# Patient Record
Sex: Male | Born: 1960 | Race: Black or African American | Hispanic: No | Marital: Married | Smoking: Former smoker
Health system: Southern US, Community
[De-identification: ages and names within clinical notes are randomized; demographics above are authoritative.]

## PROBLEM LIST (undated history)

## (undated) DIAGNOSIS — F4310 Post-traumatic stress disorder, unspecified: Secondary | ICD-10-CM

## (undated) DIAGNOSIS — E7800 Pure hypercholesterolemia, unspecified: Secondary | ICD-10-CM

## (undated) DIAGNOSIS — C61 Malignant neoplasm of prostate: Secondary | ICD-10-CM

## (undated) DIAGNOSIS — I10 Essential (primary) hypertension: Secondary | ICD-10-CM

## (undated) DIAGNOSIS — I509 Heart failure, unspecified: Secondary | ICD-10-CM

## (undated) DIAGNOSIS — I515 Myocardial degeneration: Secondary | ICD-10-CM

## (undated) DIAGNOSIS — I2510 Atherosclerotic heart disease of native coronary artery without angina pectoris: Secondary | ICD-10-CM

## (undated) HISTORY — PX: EXPLORATORY LAPAROTOMY W/ BOWEL RESECTION: SHX1544

## (undated) HISTORY — PX: LEFT HEART CATH AND CORONARY ANGIOGRAPHY: CATH118249

---

## 2008-06-20 ENCOUNTER — Encounter (INDEPENDENT_AMBULATORY_CARE_PROVIDER_SITE_OTHER): Payer: Self-pay | Admitting: Cardiovascular Disease

## 2008-06-20 ENCOUNTER — Inpatient Hospital Stay (HOSPITAL_COMMUNITY): Admission: EM | Admit: 2008-06-20 | Discharge: 2008-06-21 | Payer: Self-pay | Admitting: Emergency Medicine

## 2009-06-07 ENCOUNTER — Emergency Department (HOSPITAL_COMMUNITY): Admission: EM | Admit: 2009-06-07 | Discharge: 2009-06-07 | Payer: Self-pay | Admitting: Emergency Medicine

## 2009-06-17 ENCOUNTER — Emergency Department (HOSPITAL_COMMUNITY): Admission: EM | Admit: 2009-06-17 | Discharge: 2009-06-17 | Payer: Self-pay | Admitting: Emergency Medicine

## 2010-07-07 ENCOUNTER — Emergency Department (HOSPITAL_COMMUNITY)
Admission: EM | Admit: 2010-07-07 | Discharge: 2010-07-07 | Disposition: A | Discharge status: Home / Self Care | Payer: Self-pay | Attending: Emergency Medicine | Admitting: Emergency Medicine

## 2010-07-07 ENCOUNTER — Emergency Department (HOSPITAL_COMMUNITY): Payer: Self-pay

## 2010-07-07 DIAGNOSIS — R0789 Other chest pain: Secondary | ICD-10-CM | POA: Insufficient documentation

## 2010-07-07 DIAGNOSIS — I10 Essential (primary) hypertension: Secondary | ICD-10-CM | POA: Insufficient documentation

## 2010-07-07 DIAGNOSIS — J40 Bronchitis, not specified as acute or chronic: Secondary | ICD-10-CM | POA: Insufficient documentation

## 2010-07-07 DIAGNOSIS — R6883 Chills (without fever): Secondary | ICD-10-CM | POA: Insufficient documentation

## 2010-07-07 DIAGNOSIS — R1013 Epigastric pain: Secondary | ICD-10-CM | POA: Insufficient documentation

## 2010-07-07 DIAGNOSIS — R059 Cough, unspecified: Secondary | ICD-10-CM | POA: Insufficient documentation

## 2010-07-07 DIAGNOSIS — Z79899 Other long term (current) drug therapy: Secondary | ICD-10-CM | POA: Insufficient documentation

## 2010-07-07 DIAGNOSIS — R110 Nausea: Secondary | ICD-10-CM | POA: Insufficient documentation

## 2010-07-07 DIAGNOSIS — R05 Cough: Secondary | ICD-10-CM | POA: Insufficient documentation

## 2010-07-07 DIAGNOSIS — K219 Gastro-esophageal reflux disease without esophagitis: Secondary | ICD-10-CM | POA: Insufficient documentation

## 2010-07-07 LAB — CBC
Hemoglobin: 15.8 g/dL (ref 13.0–17.0)
MCH: 28.4 pg (ref 26.0–34.0)
RDW: 13.2 % (ref 11.5–15.5)

## 2010-07-07 LAB — COMPREHENSIVE METABOLIC PANEL
Alkaline Phosphatase: 41 U/L (ref 39–117)
BUN: 22 mg/dL (ref 6–23)
CO2: 23 mEq/L (ref 19–32)
GFR calc Af Amer: 58 mL/min — ABNORMAL LOW (ref >60)
GFR calc non Af Amer: 48 mL/min — ABNORMAL LOW (ref >60)
Glucose, Bld: 88 mg/dL (ref 70–99)
Total Protein: 8.3 g/dL (ref 6.0–8.3)

## 2010-07-07 LAB — DIFFERENTIAL
Basophils Relative: 0 % (ref 0–1)
Eosinophils Absolute: 0 10*3/uL (ref 0.0–0.7)
Lymphs Abs: 1.5 10*3/uL (ref 0.7–4.0)
Neutrophils Relative %: 83 % — ABNORMAL HIGH (ref 43–77)

## 2010-07-07 LAB — CK TOTAL AND CKMB (NOT AT ARMC)
Relative Index: 1.2 (ref 0.0–2.5)
Total CK: 332 U/L — ABNORMAL HIGH (ref 7–232)

## 2010-07-07 LAB — POCT CARDIAC MARKERS: CKMB, poc: 3.7 ng/mL (ref 1.0–8.0)

## 2010-07-07 LAB — PROTIME-INR: INR: 0.89 (ref 0.00–1.49)

## 2010-07-26 LAB — DIFFERENTIAL
Basophils Absolute: 0 10*3/uL (ref 0.0–0.1)
Basophils Relative: 0 % (ref 0–1)
Eosinophils Absolute: 0 10*3/uL (ref 0.0–0.7)
Monocytes Absolute: 0.8 10*3/uL (ref 0.1–1.0)
Monocytes Relative: 8 % (ref 3–12)
Neutro Abs: 6.9 10*3/uL (ref 1.7–7.7)
Neutrophils Relative %: 67 % (ref 43–77)

## 2010-07-26 LAB — HEPATIC FUNCTION PANEL
ALT: 15 U/L (ref 0–53)
AST: 23 U/L (ref 0–37)
Albumin: 3.8 g/dL (ref 3.5–5.2)
Alkaline Phosphatase: 31 U/L — ABNORMAL LOW (ref 39–117)
Total Bilirubin: 0.7 mg/dL (ref 0.3–1.2)

## 2010-07-26 LAB — POCT CARDIAC MARKERS
CKMB, poc: 1.8 ng/mL (ref 1.0–8.0)
CKMB, poc: 2.2 ng/mL (ref 1.0–8.0)
Myoglobin, poc: 284 ng/mL (ref 12–200)
Troponin i, poc: <0.05 ng/mL (ref 0.00–0.09)

## 2010-07-26 LAB — CBC
MCHC: 32.3 g/dL (ref 30.0–36.0)
Platelets: 222 10*3/uL (ref 150–400)
RDW: 13.7 % (ref 11.5–15.5)

## 2010-07-26 LAB — BASIC METABOLIC PANEL
CO2: 24 mEq/L (ref 19–32)
Calcium: 9 mg/dL (ref 8.4–10.5)
Chloride: 106 mEq/L (ref 96–112)
Potassium: 3.7 mEq/L (ref 3.5–5.1)
Sodium: 139 mEq/L (ref 135–145)

## 2010-07-26 LAB — D-DIMER, QUANTITATIVE: D-Dimer, Quant: <0.22 ug/mL-FEU (ref 0.00–0.48)

## 2010-07-29 LAB — POCT I-STAT, CHEM 8
Chloride: 108 mEq/L (ref 96–112)
Creatinine, Ser: 1.1 mg/dL (ref 0.4–1.5)
Glucose, Bld: 90 mg/dL (ref 70–99)
HCT: 42 % (ref 39.0–52.0)

## 2010-07-29 LAB — POCT CARDIAC MARKERS
CKMB, poc: 3.8 ng/mL (ref 1.0–8.0)
Myoglobin, poc: 246 ng/mL (ref 12–200)
Troponin i, poc: <0.05 ng/mL (ref 0.00–0.09)

## 2010-08-25 LAB — COMPREHENSIVE METABOLIC PANEL
ALT: 32 U/L (ref 0–53)
AST: 39 U/L — ABNORMAL HIGH (ref 0–37)
Calcium: 9.1 mg/dL (ref 8.4–10.5)
GFR calc Af Amer: >60 mL/min (ref >60)
Sodium: 138 mEq/L (ref 135–145)
Total Protein: 6.5 g/dL (ref 6.0–8.3)

## 2010-08-25 LAB — POCT I-STAT 3, VENOUS BLOOD GAS (G3P V)
Acid-base deficit: 6 mmol/L — ABNORMAL HIGH (ref 0.0–2.0)
Bicarbonate: 19.7 mEq/L — ABNORMAL LOW (ref 20.0–24.0)
pH, Ven: 7.291 (ref 7.250–7.300)
pO2, Ven: 39 mmHg (ref 30.0–45.0)

## 2010-08-25 LAB — CARDIAC PANEL(CRET KIN+CKTOT+MB+TROPI)
CK, MB: 3.4 ng/mL (ref 0.3–4.0)
Relative Index: 1 (ref 0.0–2.5)
Total CK: 253 U/L — ABNORMAL HIGH (ref 7–232)
Total CK: 294 U/L — ABNORMAL HIGH (ref 7–232)
Troponin I: 0.01 ng/mL (ref 0.00–0.06)
Troponin I: <0.01 ng/mL (ref 0.00–0.06)
Troponin I: <0.01 ng/mL (ref 0.00–0.06)

## 2010-08-25 LAB — BASIC METABOLIC PANEL
BUN: 10 mg/dL (ref 6–23)
CO2: 23 mEq/L (ref 19–32)
Calcium: 8.6 mg/dL (ref 8.4–10.5)
Calcium: 8.8 mg/dL (ref 8.4–10.5)
Creatinine, Ser: 0.98 mg/dL (ref 0.4–1.5)
Creatinine, Ser: 0.98 mg/dL (ref 0.4–1.5)
GFR calc Af Amer: >60 mL/min (ref >60)
GFR calc non Af Amer: >60 mL/min (ref >60)
GFR calc non Af Amer: >60 mL/min (ref >60)
Glucose, Bld: 143 mg/dL — ABNORMAL HIGH (ref 70–99)
Potassium: 4.2 mEq/L (ref 3.5–5.1)
Sodium: 138 mEq/L (ref 135–145)

## 2010-08-25 LAB — CBC
HCT: 39.6 % (ref 39.0–52.0)
Hemoglobin: 13.6 g/dL (ref 13.0–17.0)
Hemoglobin: 13.8 g/dL (ref 13.0–17.0)
MCHC: 34.3 g/dL (ref 30.0–36.0)
MCHC: 34.6 g/dL (ref 30.0–36.0)
MCV: 90.1 fL (ref 78.0–100.0)
Platelets: 227 10*3/uL (ref 150–400)
RBC: 4.56 MIL/uL (ref 4.22–5.81)
RDW: 14 % (ref 11.5–15.5)
RDW: 14.2 % (ref 11.5–15.5)
WBC: 5.9 10*3/uL (ref 4.0–10.5)

## 2010-08-25 LAB — POCT I-STAT, CHEM 8
Creatinine, Ser: 1 mg/dL (ref 0.4–1.5)
Glucose, Bld: 145 mg/dL — ABNORMAL HIGH (ref 70–99)
HCT: 45 % (ref 39.0–52.0)
Hemoglobin: 15.3 g/dL (ref 13.0–17.0)
Potassium: 3.4 mEq/L — ABNORMAL LOW (ref 3.5–5.1)
Sodium: 141 mEq/L (ref 135–145)
TCO2: 24 mmol/L (ref 0–100)

## 2010-08-25 LAB — TROPONIN I: Troponin I: <0.01 ng/mL (ref 0.00–0.06)

## 2010-08-25 LAB — POCT I-STAT 3, ART BLOOD GAS (G3+)
Acid-base deficit: 2 mmol/L (ref 0.0–2.0)
pH, Arterial: 7.379 (ref 7.350–7.450)

## 2010-08-25 LAB — DIFFERENTIAL
Eosinophils Absolute: 0.1 10*3/uL (ref 0.0–0.7)
Eosinophils Relative: 1 % (ref 0–5)
Lymphs Abs: 1.3 10*3/uL (ref 0.7–4.0)
Monocytes Relative: 8 % (ref 3–12)

## 2010-08-25 LAB — URINALYSIS, ROUTINE W REFLEX MICROSCOPIC
Glucose, UA: NEGATIVE mg/dL
Hgb urine dipstick: NEGATIVE
Protein, ur: NEGATIVE mg/dL
pH: 6 (ref 5.0–8.0)

## 2010-08-25 LAB — SEDIMENTATION RATE: Sed Rate: 2 mm/hr (ref 0–16)

## 2010-08-25 LAB — POCT CARDIAC MARKERS
CKMB, poc: 2.1 ng/mL (ref 1.0–8.0)
Myoglobin, poc: 242 ng/mL (ref 12–200)

## 2010-08-25 LAB — CK TOTAL AND CKMB (NOT AT ARMC)
CK, MB: 3.8 ng/mL (ref 0.3–4.0)
Total CK: 373 U/L — ABNORMAL HIGH (ref 7–232)

## 2010-08-25 LAB — URINE MICROSCOPIC-ADD ON

## 2010-08-25 LAB — LIPID PANEL: VLDL: 21 mg/dL (ref 0–40)

## 2010-08-25 LAB — PROTIME-INR: Prothrombin Time: 13.1 seconds (ref 11.6–15.2)

## 2010-08-25 LAB — MAGNESIUM: Magnesium: 1.7 mg/dL (ref 1.5–2.5)

## 2010-08-25 LAB — APTT: aPTT: 27 seconds (ref 24–37)

## 2010-09-22 NOTE — Cardiovascular Report (Signed)
NAME:  Glenn Brandt, Glenn Brandt NO.:  [id number]   MEDICAL RECORD NO.:  [id number]           PATIENT TYPE:   LOCATION:                                 FACILITY:   PHYSICIAN:  Madaline Savage, M.D.DATE OF BIRTH:  03-25-1961   DATE OF PROCEDURE:  06/20/2008  DATE OF DISCHARGE:                            CARDIAC CATHETERIZATION   PROCEDURES PERFORMED:  1. Selective coronary angiography by Judkins technique.  2. Retrograde left heart catheterization.  3. Left ventricular angiography.  4. Right heart catheterization with Fick cardiac output      determinations.   INTERVENTIONS:  None.   COMPLICATIONS:  None.   PATIENT PROFILE:  [name] is a 50 year old African American gentleman  who was admitted on June 19, 2001, for chest pain, left neck pain,  and shoulder pain.  He has a [facility].  He does not give a history of a prior cardiac  history but presented to our emergency room with chest pain and had an  EKG showing increased voltages without actual left ventricular  hypertrophy, sinus bradycardia at a rate of 57 a minute, and some T-wave  inversions in the inferior leads.  A combined right and left heart  catheterization was performed electively from the right percutaneous  femoral approach using a 5-French arterial catheter and a 7-French  venous catheter with a Swan-Ganz catheter for him with dynamic  measurements.  No complications occurred.  This procedure was performed  via right percutaneous femoral approach.   RESULTS:  Pressures:  The central aortic pressure was 130/70 with a mean  of 97.  The left ventricular pressure was 115/1, end-diastolic pressure  of 8.  Right atrial pressure was 9/7 with a mean of 4.  Right  ventricular pressure was 26/1 with an end-diastolic pressure of 6.  Pulmonary artery pressure was 26/10 with a mean pressure of 16.  Pulmonary capillary wedge mean pressure was 8.  Pulmonary  artery  saturation was 68.  Central aortic saturation was 95.   ANGIOGRAPHIC RESULTS:  The patient had normal coronary arteries with no  coronary calcification.  No spasm and no evidence of coronary disease.   Left ventricular angiogram showed mildly dilated left ventricle with  ejection fraction estimated between 30 and 40% on a global basis.   The Fick cardiac index was 2.67.  The Fick cardiac output was 5.33.   FINAL IMPRESSIONS:  1. Recent chest pain of a somewhat atypical nature.  2. History of alcohol use.  3. Dilated cardiomyopathy with left ventricular ejection fraction of      30-40%.  4. Angiographically patent coronary arteries.   PLAN:  The patient will get counseling for alcohol use.  He will need a  regimen of digitalis, ACE inhibitor, beta-blocker, and BiDil.  He will  be followed up with an echocardiogram now and again in 3 months after  medical therapy has been tried for 3 months.           ______________________________  Madaline Savage, M.D.     WHG/MEDQ  D:  06/20/2008  T:  06/20/2008  Job:  [id number]   cc:   Nanetta Batty, M.D.  [facility]

## 2010-09-22 NOTE — Discharge Summary (Signed)
NAME:  Glenn Brandt, Glenn Brandt NO.:  [id number]   MEDICAL RECORD NO.:  [id number]          PATIENT TYPE:  INP   LOCATION:  4706                         FACILITY:  [facility]   PHYSICIAN:  Ritta Slot, MD     DATE OF BIRTH:  [birth year]   DATE OF ADMISSION:  06/19/2008  DATE OF DISCHARGE:  06/21/2008                               DISCHARGE SUMMARY   DISCHARGE DIAGNOSES:  1. Chest pain, worrisome for unstable angina, catheterization this      admission showing normal coronaries.  2. Abnormal EKG with left ventricular hypertrophy and diffuse T-wave      inversion.  3. Nonischemic cardiomyopathy with ejection fraction of 35% at cath,      ejection fraction by echo was pending.  4. History of smoking.  5. History of alcohol abuse.   HOSPITAL COURSE:  The patient is a 50 year old male with no prior  cardiac history.  He does have a family history of premature coronary  artery disease.  He presented to the emergency room on June 19, 2008, with chest pain, worrisome for unstable angina.  He does have  positive risk factors and we decided to proceed with a diagnostic  catheterization.  This was done on June 20, 2008.  The patient was  put on Lovenox prior to the procedure.  Catheterization revealed normal  coronaries.  His EF was 35%.  Echocardiogram has been obtained and  results of this are pending.  This can be followed up as an outpatient.  He was seen in consult by smoking cessation.   DISCHARGE MEDICATIONS:  1. Altace 5 mg a day.  2. Coreg 3.125 mg b.i.d.  3. Coated aspirin 81 mg a day.  4. Naprosyn 500 mg b.i.d. p.r.n.   LABORATORY DATA:  White count 11, hemoglobin 13.8, hematocrit 39.7, and  platelets 228.  Sodium 138, potassium 3.8, BUN 8, and creatinine 0.98.  SGOT is 39 and SGPT is 32.  CK was elevated at 373.  Troponins were  negative.  Hemoglobin A1c is 5.5.  TSH is 0.96.  C-reactive protein is  0.1.  Chest x-ray shows no acute findings.   DISPOSITION:   The patient is discharged in stable condition.  We had to  limit his medications because of relative hypotension.  He will need a  followup echocardiogram in a couple of months.  He has been instructed  to avoid alcohol and on a low-salt diet.      Abelino Derrick, P.A.      Ritta Slot, MD  Electronically Signed   LKK/MEDQ  D:  06/21/2008  T:  06/21/2008  Job:  [id number]
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
## 2012-04-04 ENCOUNTER — Emergency Department (HOSPITAL_COMMUNITY): Payer: Non-veteran care

## 2012-04-04 ENCOUNTER — Observation Stay (HOSPITAL_COMMUNITY)
Admission: EM | Admit: 2012-04-04 | Discharge: 2012-04-05 | Disposition: A | Discharge status: Home / Self Care | Payer: Non-veteran care | Attending: Emergency Medicine | Admitting: Emergency Medicine

## 2012-04-04 ENCOUNTER — Encounter (HOSPITAL_COMMUNITY): Payer: Self-pay | Admitting: *Deleted

## 2012-04-04 DIAGNOSIS — I2510 Atherosclerotic heart disease of native coronary artery without angina pectoris: Secondary | ICD-10-CM | POA: Insufficient documentation

## 2012-04-04 DIAGNOSIS — Z8679 Personal history of other diseases of the circulatory system: Secondary | ICD-10-CM

## 2012-04-04 DIAGNOSIS — R079 Chest pain, unspecified: Principal | ICD-10-CM | POA: Insufficient documentation

## 2012-04-04 DIAGNOSIS — F17200 Nicotine dependence, unspecified, uncomplicated: Secondary | ICD-10-CM | POA: Insufficient documentation

## 2012-04-04 DIAGNOSIS — I252 Old myocardial infarction: Secondary | ICD-10-CM | POA: Insufficient documentation

## 2012-04-04 DIAGNOSIS — I10 Essential (primary) hypertension: Secondary | ICD-10-CM | POA: Insufficient documentation

## 2012-04-04 HISTORY — DX: Myocardial degeneration: I51.5

## 2012-04-04 HISTORY — DX: Atherosclerotic heart disease of native coronary artery without angina pectoris: I25.10

## 2012-04-04 LAB — CBC WITH DIFFERENTIAL/PLATELET
Basophils Absolute: 0 10*3/uL (ref 0.0–0.1)
Basophils Relative: 0 % (ref 0–1)
Eosinophils Relative: 2 % (ref 0–5)
Lymphocytes Relative: 30 % (ref 12–46)
MCHC: 34.4 g/dL (ref 30.0–36.0)
MCV: 82.1 fL (ref 78.0–100.0)
Platelets: 235 10*3/uL (ref 150–400)
RDW: 13.6 % (ref 11.5–15.5)
WBC: 6.3 10*3/uL (ref 4.0–10.5)

## 2012-04-04 LAB — COMPREHENSIVE METABOLIC PANEL
ALT: 14 U/L (ref 0–53)
AST: 20 U/L (ref 0–37)
Albumin: 3.8 g/dL (ref 3.5–5.2)
CO2: 25 mEq/L (ref 19–32)
Calcium: 9.1 mg/dL (ref 8.4–10.5)
GFR calc non Af Amer: 81 mL/min — ABNORMAL LOW (ref >90)
Sodium: 137 mEq/L (ref 135–145)
Total Protein: 7.2 g/dL (ref 6.0–8.3)

## 2012-04-04 LAB — POCT I-STAT TROPONIN I

## 2012-04-04 MED ORDER — ASPIRIN 325 MG PO TABS
325.0000 mg | ORAL_TABLET | Freq: Once | ORAL | Status: AC
  Administered 2012-04-04: 325 mg via ORAL
  Filled 2012-04-04: qty 1

## 2012-04-04 NOTE — ED Notes (Signed)
Dr. Yao at bedside. 

## 2012-04-04 NOTE — ED Notes (Addendum)
Pt. A.O. X 4. Complains of burning sensation in L chest. 8/10. Denies SOB. Denies N/V. Denies any other pain.  Ambulatory with steady gait. No assist needed. Updated on plan of care:  Aware of NPO status after 0400. Aware of 0500 medication. Aware of 0600 EKG. Pt resting comfortably in bed watching tv. No further needs at this time.

## 2012-04-04 NOTE — ED Provider Notes (Signed)
Patient in CDU under chest pain protocol.  Patient is currently chest pain free.  Lungs CTA bilaterally.  S1/S2, RRR. Abdomen soft, bowel sounds present.  Strong distal pulses palpated all extremities.  Monitor reveals NSR without ectopy.  12 lead reviewed, no indication of ischemia.  Cardiac markers negative.  Patient scheduled for coronary CT in AM.  Diagnostic and treatment plan discussed with patient.   Jimmye Norman, NP 04/04/12 [phone]

## 2012-04-04 NOTE — ED Notes (Signed)
Report given to Lori, RN

## 2012-04-04 NOTE — ED Provider Notes (Signed)
Medical screening examination/treatment/procedure(s) were performed by non-physician practitioner and as supervising physician I was immediately available for consultation/collaboration.   David H Yao, MD 04/04/12 2321 

## 2012-04-04 NOTE — ED Notes (Signed)
Pt states pain last night came up left side of neck. Pt states came into today because he was at store and felt a grasping cramp in center of chest. Pt states pain only lasted few minutes. Pt is mentating appropriately. Pt denies shortness of breath.

## 2012-04-04 NOTE — ED Notes (Signed)
The pt has had mid and lt sided chest cramps for one week no nv or dizziness.

## 2012-04-04 NOTE — ED Provider Notes (Signed)
History     CSN: [id number]  Arrival date & time 04/04/12  1625   First MD Initiated Contact with Patient 04/04/12 1707      Chief Complaint  Patient presents with  . Chest Pain    (Consider location/radiation/quality/duration/timing/severity/associated sxs/prior treatment) The history is provided by the patient.  Glenn Brandt is a 51 y.o. male hx of HTN, CAD no stents, MI a year ago, here with chest pain. L sided chest pain for 3 days. Sharp, lasting several seconds, occasionally radiating to neck and L arm. No SOB or diaphoresis. He is a smoker and has positive family hx of CAD. He had MI a year ago with nl coronaries so no stents were placed.    Past Medical History  Diagnosis Date  . Coronary artery disease   . Myocardial disease     History reviewed. No pertinent past surgical history.  No family history on file.  History  Substance Use Topics  . Smoking status: Current Every Day Smoker  . Smokeless tobacco: Not on file  . Alcohol Use: Yes      Review of Systems  Cardiovascular: Positive for chest pain.  All other systems reviewed and are negative.    Allergies  Review of patient's allergies indicates no known allergies.  Home Medications  No current outpatient prescriptions on file.  BP 115/73  Pulse 94  Temp 98.5 F (36.9 C) (Oral)  Resp 18  SpO2 97%  Physical Exam  Nursing note and vitals reviewed. Constitutional: He is oriented to person, place, and time. He appears well-developed and well-nourished.       NAD   HENT:  Head: Normocephalic.  Mouth/Throat: Oropharynx is clear and moist.  Eyes: Conjunctivae normal are normal. Pupils are equal, round, and reactive to light.  Neck: Normal range of motion. Neck supple.  Cardiovascular: Normal rate, regular rhythm and normal heart sounds.   Pulmonary/Chest: Effort normal and breath sounds normal. No respiratory distress. He has no wheezes. He has no rales.  Abdominal: Soft. Bowel sounds are  normal. He exhibits no distension. There is no tenderness. There is no rebound.  Musculoskeletal: Normal range of motion. He exhibits no edema and no tenderness.  Neurological: He is alert and oriented to person, place, and time.  Skin: Skin is warm and dry.  Psychiatric: He has a normal mood and affect. His behavior is normal. Judgment and thought content normal.    ED Course  Procedures (including critical care time)  Labs Reviewed  COMPREHENSIVE METABOLIC PANEL - Abnormal; Notable for the following:    Glucose, Bld 104 (*)     Alkaline Phosphatase 36 (*)     Total Bilirubin 0.2 (*)     GFR calc non Af Amer 81 (*)     All other components within normal limits  CBC WITH DIFFERENTIAL  TROPONIN I   Dg Chest 2 View  04/04/2012  *RADIOLOGY REPORT*  Clinical Data: Chest pain.  CHEST - 2 VIEW  Comparison: Chest x-ray 07/07/2010.  Findings: Lung volumes are normal.  No consolidative airspace disease.  No pleural effusions.  No pneumothorax.  No pulmonary nodule or mass noted.  Pulmonary vasculature and the cardiomediastinal silhouette are within normal limits.  IMPRESSION: 1. No radiographic evidence of acute cardiopulmonary disease.   Original Report Authenticated By: Trudie Reed, M.D.      No diagnosis found.   Date: 04/04/2012  Rate: 83  Rhythm: normal sinus rhythm  QRS Axis: normal  Intervals: normal  ST/T Wave  abnormalities: ST depression laterally  Conduction Disutrbances:none  Narrative Interpretation:   Old EKG Reviewed: none available    MDM  Glenn Brandt is a 51 y.o. male here with chest pain. He is moderate risk for ACS. Will place in CDU under chest pain protocol. I discussed with CDU PA and with [facility] cardiology (Ms. Annie Paras, NP), who saw him a year ago.   6:01 PM Trop neg x 1. Will transfer to CDU.         Richardean Canal, MD 04/04/12 [phone]

## 2012-04-04 NOTE — ED Notes (Signed)
Pt returned from Xray and placed back on monitor.

## 2012-04-05 ENCOUNTER — Observation Stay (HOSPITAL_COMMUNITY): Payer: Non-veteran care

## 2012-04-05 LAB — POCT I-STAT TROPONIN I

## 2012-04-05 MED ORDER — METOPROLOL TARTRATE 1 MG/ML IV SOLN
INTRAVENOUS | Status: AC
  Filled 2012-04-05: qty 10

## 2012-04-05 MED ORDER — IOHEXOL 350 MG/ML SOLN
80.0000 mL | Freq: Once | INTRAVENOUS | Status: AC | PRN
  Administered 2012-04-05: 80 mL via INTRAVENOUS

## 2012-04-05 MED ORDER — METOPROLOL TARTRATE 25 MG PO TABS
100.0000 mg | ORAL_TABLET | Freq: Once | ORAL | Status: AC
  Administered 2012-04-05: 100 mg via ORAL
  Filled 2012-04-05: qty 4

## 2012-04-05 MED ORDER — NITROGLYCERIN 0.4 MG SL SUBL
0.4000 mg | SUBLINGUAL_TABLET | Freq: Once | SUBLINGUAL | Status: AC
  Administered 2012-04-05: 0.4 mg via SUBLINGUAL

## 2012-04-05 MED ORDER — NITROGLYCERIN 0.4 MG SL SUBL
SUBLINGUAL_TABLET | SUBLINGUAL | Status: AC
  Administered 2012-04-05: 0.4 mg via SUBLINGUAL
  Filled 2012-04-05: qty 25

## 2012-04-05 NOTE — Progress Notes (Signed)
Utilization review completed.  P.J. Esmond Hinch,RN,BSN Case Manager [phone]  

## 2012-04-05 NOTE — ED Provider Notes (Signed)
Patient moved to CDU under chest pain protocol by Dr Rubin Payor. Patient resting comfortably at present with only mild L sided chest cramping which has been persistent for 5 days. Lungs CTA bilaterally. S1/S2, RRR, no murmur. Abdomen soft, bowel sounds present. Strong distal pulses palpated all extremities. Sinus rhythm on monitor without ectopy. Troponin negative x 3.  12 lead reviewed, no indication of ischemia. Patient scheduled for CT coronary this AM. Diagnostic and treatment plan discussed with patient.   BP 109/74  Pulse 63  Temp 97.8 F (36.6 C) (Oral)  Resp 18  Ht 5\' 11"  (1.803 m)  Wt 182 lb 9 oz (82.81 kg)  BMI 25.46 kg/m2  SpO2 98%   He is a patient of [facility].  Dr. Silverio Lay spoke with [facility] (Ms. Annie Paras, NP who saw him 1 yr ago) last night and in light of the patient's normal cath last year they are recommending placement on the CP protocol and a subsequent coronary CT.    10:31 AM Report called by Richarda Overlie, MD: No evidence for coronary artery disease. The coronary calcium score is zero.  I discussed these findings with the patient. I have recommended strict followup of [facility] secondary to his history. He knows the we have discussed the case with them will be expecting his call today or Friday morning to set up a followup appointment.  I have also discussed reasons to return immediately to the ER.  Patient expresses understanding and agrees with plan.  1. Medications: usual home medications 2. Treatment: rest, drink plenty of fluids, take medications as prescribed 3. Follow Up: Please followup with your primary doctor for discussion of your diagnoses and further evaluation after today's visit; if you do not have a primary care doctor use the resource guide provided to find one; also followup with [facility] for further evaluation after today's visit.   Dahlia Client Vonnie Ligman, PA-C 04/05/12 1645

## 2012-04-05 NOTE — ED Notes (Signed)
Pt BMI IS 25.5

## 2012-04-06 NOTE — ED Provider Notes (Signed)
Medical screening examination/treatment/procedure(s) were performed by non-physician practitioner and as supervising physician I was immediately available for consultation/collaboration.   Hurman Horn, MD 04/06/12 [phone]

## 2015-05-12 ENCOUNTER — Encounter (HOSPITAL_COMMUNITY): Payer: Self-pay

## 2015-05-12 ENCOUNTER — Other Ambulatory Visit: Payer: Self-pay

## 2015-05-12 ENCOUNTER — Emergency Department (HOSPITAL_COMMUNITY)
Admission: EM | Admit: 2015-05-12 | Discharge: 2015-05-12 | Disposition: A | Discharge status: Home / Self Care | Payer: Non-veteran care | Attending: Emergency Medicine | Admitting: Emergency Medicine

## 2015-05-12 ENCOUNTER — Emergency Department (HOSPITAL_COMMUNITY): Payer: Non-veteran care

## 2015-05-12 DIAGNOSIS — R079 Chest pain, unspecified: Secondary | ICD-10-CM

## 2015-05-12 DIAGNOSIS — I252 Old myocardial infarction: Secondary | ICD-10-CM | POA: Diagnosis not present

## 2015-05-12 DIAGNOSIS — E7800 Pure hypercholesterolemia, unspecified: Secondary | ICD-10-CM | POA: Insufficient documentation

## 2015-05-12 DIAGNOSIS — I10 Essential (primary) hypertension: Secondary | ICD-10-CM | POA: Insufficient documentation

## 2015-05-12 DIAGNOSIS — F17200 Nicotine dependence, unspecified, uncomplicated: Secondary | ICD-10-CM | POA: Diagnosis not present

## 2015-05-12 DIAGNOSIS — Z7982 Long term (current) use of aspirin: Secondary | ICD-10-CM | POA: Diagnosis not present

## 2015-05-12 DIAGNOSIS — I2510 Atherosclerotic heart disease of native coronary artery without angina pectoris: Secondary | ICD-10-CM | POA: Diagnosis not present

## 2015-05-12 DIAGNOSIS — Z79899 Other long term (current) drug therapy: Secondary | ICD-10-CM | POA: Insufficient documentation

## 2015-05-12 HISTORY — DX: Pure hypercholesterolemia, unspecified: E78.00

## 2015-05-12 HISTORY — DX: Essential (primary) hypertension: I10

## 2015-05-12 LAB — BASIC METABOLIC PANEL
Anion gap: 8 (ref 5–15)
BUN: 11 mg/dL (ref 6–20)
CHLORIDE: 107 mmol/L (ref 101–111)
CO2: 25 mmol/L (ref 22–32)
CREATININE: 0.99 mg/dL (ref 0.61–1.24)
Calcium: 9.3 mg/dL (ref 8.9–10.3)
GFR calc Af Amer: >60 mL/min (ref >60)
GFR calc non Af Amer: >60 mL/min (ref >60)
GLUCOSE: 111 mg/dL — AB (ref 65–99)
POTASSIUM: 4 mmol/L (ref 3.5–5.1)
Sodium: 140 mmol/L (ref 135–145)

## 2015-05-12 LAB — CBC
HEMATOCRIT: 39.2 % (ref 39.0–52.0)
Hemoglobin: 13.3 g/dL (ref 13.0–17.0)
MCH: 28.2 pg (ref 26.0–34.0)
MCHC: 33.9 g/dL (ref 30.0–36.0)
MCV: 83.1 fL (ref 78.0–100.0)
PLATELETS: 249 10*3/uL (ref 150–400)
RBC: 4.72 MIL/uL (ref 4.22–5.81)
RDW: 14.4 % (ref 11.5–15.5)
WBC: 5.5 10*3/uL (ref 4.0–10.5)

## 2015-05-12 LAB — I-STAT TROPONIN, ED: Troponin i, poc: 0 ng/mL (ref 0.00–0.08)

## 2015-05-12 LAB — TROPONIN I: Troponin I: <0.03 ng/mL (ref <0.031)

## 2015-05-12 NOTE — Discharge Instructions (Signed)

## 2015-05-12 NOTE — ED Notes (Signed)
Patient verbalized understanding of discharge instructions and denies any further needs or questions at this time. VS stable. Patient ambulatory with steady gait.  

## 2015-05-12 NOTE — ED Notes (Signed)
Patient here with chest pain that started yesterday, reports radiation to left arm with feeling like his chest is fluttering

## 2015-05-12 NOTE — ED Provider Notes (Signed)
CSN: [id number]     Arrival date & time 05/12/15  1135 History   First MD Initiated Contact with Patient 05/12/15 1505     Chief Complaint  Patient presents with  . Chest Pain     (Consider location/radiation/quality/duration/timing/severity/associated sxs/prior Treatment) HPI  The pt has no hx of CAD - he reports that he has never had an MI - he reports that after having a normal heart catheterization several years ago he has not had any further chest pain until yesterday when he developed this while driving. He became diaphoretic while he was walking through the [place] and this resolved. It happened again this morning that it resolved again and at this time he is chest pain-free. He does not get short of breath, he had some tingling sensation in his left arm which is also resolved. Review of the medical record shows heart catheterization in 2012, see this impression below  FINAL IMPRESSIONS: 1. Recent chest pain of a somewhat atypical nature. 2. History of alcohol use. 3. Dilated cardiomyopathy with left ventricular ejection fraction of  30-40%. 4. Angiographically patent coronary arteries.  The patient has no chest pain at this time, he denies that this is exertional, it occurs at rest as well and is associated with a palpitation that lasted for a couple of seconds.  Past Medical History  Diagnosis Date  . Coronary artery disease   . Myocardial disease ([place])   . Hypertension   . High cholesterol    History reviewed. No pertinent past surgical history. No family history on file. Social History  Substance Use Topics  . Smoking status: Current Every Day Smoker  . Smokeless tobacco: None  . Alcohol Use: Yes    Review of Systems  All other systems reviewed and are negative.     Allergies  Review of patient's allergies indicates no known allergies.  Home Medications   Prior to Admission medications   Medication Sig Start Date End Date Taking? Authorizing  Provider  albuterol (PROVENTIL HFA;VENTOLIN HFA) 108 (90 BASE) MCG/ACT inhaler Inhale 2 puffs into the lungs 2 (two) times daily as needed. For shortness of breath or wheezing   Yes Historical Provider, MD  aspirin 81 MG chewable tablet Chew 81 mg by mouth daily. For chest pain   Yes Historical Provider, MD  clonazePAM (KLONOPIN) 1 MG tablet Take 1 mg by mouth 2 (two) times daily as needed for anxiety.   Yes Historical Provider, MD  gabapentin (NEURONTIN) 100 MG capsule Take 200 mg by mouth at bedtime.   Yes Historical Provider, MD  HYDROcodone-acetaminophen (NORCO/VICODIN) 5-325 MG tablet Take 1 tablet by mouth every 6 (six) hours as needed for moderate pain.   Yes Historical Provider, MD  lisinopril (PRINIVIL,ZESTRIL) 20 MG tablet Take 10 mg by mouth every morning.   Yes Historical Provider, MD  prazosin (MINIPRESS) 1 MG capsule Take 1 mg by mouth at bedtime.   Yes Historical Provider, MD  simvastatin (ZOCOR) 20 MG tablet Take 10 mg by mouth at bedtime.   Yes Historical Provider, MD   BP 123/76 mmHg  Pulse 73  Temp(Src) 98.9 F (37.2 C) (Oral)  Resp 19  SpO2 98% Physical Exam  Constitutional: He appears well-developed and well-nourished. No distress.  HENT:  Head: Normocephalic and atraumatic.  Mouth/Throat: Oropharynx is clear and moist. No oropharyngeal exudate.  Eyes: Conjunctivae and EOM are normal. Pupils are equal, round, and reactive to light. Right eye exhibits no discharge. Left eye exhibits no discharge. No scleral icterus.  Neck: Normal range of motion. Neck supple. No JVD present. No thyromegaly present.  Cardiovascular: Normal rate, regular rhythm, normal heart sounds and intact distal pulses.  Exam reveals no gallop and no friction rub.   No murmur heard. Pulmonary/Chest: Effort normal and breath sounds normal. No respiratory distress. He has no wheezes. He has no rales.  Abdominal: Soft. Bowel sounds are normal. He exhibits no distension and no mass. There is no tenderness.   Musculoskeletal: Normal range of motion. He exhibits no edema or tenderness.  Lymphadenopathy:    He has no cervical adenopathy.  Neurological: He is alert. Coordination normal.  Skin: Skin is warm and dry. No rash noted. No erythema.  Psychiatric: He has a normal mood and affect. His behavior is normal.  Nursing note and vitals reviewed.   ED Course  Procedures (including critical care time) Labs Review Labs Reviewed  BASIC METABOLIC PANEL - Abnormal; Notable for the following:    Glucose, Bld 111 (*)    All other components within normal limits  CBC  TROPONIN I  I-STAT TROPOININ, ED    Imaging Review Dg Chest 2 View  05/12/2015  CLINICAL DATA:  Left-sided chest pain for 1 day EXAM: CHEST  2 VIEW COMPARISON:  None. FINDINGS: Normal heart size. Clear lungs. No pneumothorax. No pleural effusion. IMPRESSION: No active cardiopulmonary disease. Electronically Signed   By: Marybelle Killings M.D.   On: 05/12/2015 13:27   I have personally reviewed and evaluated these images and lab results as part of my medical decision-making.   EKG Interpretation   Date/Time:  Monday May 12 2015 11:35:10 EST Ventricular Rate:  96 PR Interval:  182 QRS Duration: 144 QT Interval:  396 QTC Calculation: 500 R Axis:   -60 Text Interpretation:  Normal sinus rhythm Right bundle branch block Left  anterior fascicular block  Bifascicular block  Left ventricular  hypertrophy Cannot rule out Septal infarct , age undetermined Abnormal ECG  Since last tracing Right bundle branch block NOW PRESENT Confirmed by  Sabra Heck  MD, Faigy Stretch ([id number]) on 05/12/2015 3:32:17 PM      MDM   Final diagnoses:  Chest pain, unspecified chest pain type    Left ventricular hypertrophy is seen on the EKG, this is likely related to his alcohol related dilated cardiomyopathy. Vital signs are unremarkable, troponin is normal, we'll repeat EKG and troponin but the patient is low risk for obstructive coronary disease secondary to  his history of clean coronary catheterization within the last several years.  Repeat troponin and EKG unchanged, no measured troponin, stable for discharge  Recommended daily aspirin, cardiology follow-up given   Noemi Chapel, MD 05/12/15 [phone]

## 2017-04-02 ENCOUNTER — Other Ambulatory Visit: Payer: Self-pay

## 2017-04-02 ENCOUNTER — Emergency Department (HOSPITAL_COMMUNITY)
Admission: EM | Admit: 2017-04-02 | Discharge: 2017-04-02 | Disposition: A | Discharge status: Home / Self Care | Payer: Non-veteran care | Attending: Emergency Medicine | Admitting: Emergency Medicine

## 2017-04-02 ENCOUNTER — Encounter (HOSPITAL_COMMUNITY): Payer: Self-pay | Admitting: Emergency Medicine

## 2017-04-02 ENCOUNTER — Emergency Department (HOSPITAL_COMMUNITY): Payer: Non-veteran care

## 2017-04-02 DIAGNOSIS — I2510 Atherosclerotic heart disease of native coronary artery without angina pectoris: Secondary | ICD-10-CM | POA: Diagnosis not present

## 2017-04-02 DIAGNOSIS — I10 Essential (primary) hypertension: Secondary | ICD-10-CM | POA: Insufficient documentation

## 2017-04-02 DIAGNOSIS — Z79899 Other long term (current) drug therapy: Secondary | ICD-10-CM | POA: Diagnosis not present

## 2017-04-02 DIAGNOSIS — R072 Precordial pain: Secondary | ICD-10-CM | POA: Diagnosis not present

## 2017-04-02 DIAGNOSIS — F17210 Nicotine dependence, cigarettes, uncomplicated: Secondary | ICD-10-CM | POA: Insufficient documentation

## 2017-04-02 DIAGNOSIS — R079 Chest pain, unspecified: Secondary | ICD-10-CM | POA: Diagnosis present

## 2017-04-02 DIAGNOSIS — R0789 Other chest pain: Secondary | ICD-10-CM

## 2017-04-02 LAB — I-STAT TROPONIN, ED
Troponin i, poc: 0 ng/mL (ref 0.00–0.08)
Troponin i, poc: 0.01 ng/mL (ref 0.00–0.08)

## 2017-04-02 LAB — BASIC METABOLIC PANEL
ANION GAP: 8 (ref 5–15)
BUN: 12 mg/dL (ref 6–20)
CALCIUM: 9.3 mg/dL (ref 8.9–10.3)
CO2: 22 mmol/L (ref 22–32)
Chloride: 107 mmol/L (ref 101–111)
Creatinine, Ser: 1.06 mg/dL (ref 0.61–1.24)
Glucose, Bld: 105 mg/dL — ABNORMAL HIGH (ref 65–99)
POTASSIUM: 4.3 mmol/L (ref 3.5–5.1)
Sodium: 137 mmol/L (ref 135–145)

## 2017-04-02 LAB — CBC
HEMATOCRIT: 40.3 % (ref 39.0–52.0)
HEMOGLOBIN: 13.6 g/dL (ref 13.0–17.0)
MCH: 28.6 pg (ref 26.0–34.0)
MCHC: 33.7 g/dL (ref 30.0–36.0)
MCV: 84.7 fL (ref 78.0–100.0)
Platelets: 228 10*3/uL (ref 150–400)
RBC: 4.76 MIL/uL (ref 4.22–5.81)
RDW: 14.7 % (ref 11.5–15.5)
WBC: 5.6 10*3/uL (ref 4.0–10.5)

## 2017-04-02 MED ORDER — DIAZEPAM 5 MG PO TABS
5.0000 mg | ORAL_TABLET | Freq: Once | ORAL | Status: AC
  Administered 2017-04-02: 5 mg via ORAL
  Filled 2017-04-02: qty 1

## 2017-04-02 MED ORDER — ASPIRIN 81 MG PO CHEW
324.0000 mg | CHEWABLE_TABLET | Freq: Once | ORAL | Status: AC
  Administered 2017-04-02: 324 mg via ORAL
  Filled 2017-04-02: qty 4

## 2017-04-02 MED ORDER — METHOCARBAMOL 500 MG PO TABS: 500.0000 mg | ORAL_TABLET | Freq: Two times a day (BID) | ORAL | 0 refills | Status: DC | PRN

## 2017-04-02 NOTE — ED Notes (Signed)
Pt alert watching tv  He reports that he feels better

## 2017-04-02 NOTE — ED Notes (Signed)
Mid-chest pain today  At present alert no distress pa at the beside hx of similar episodes  In the past

## 2017-04-02 NOTE — ED Triage Notes (Signed)
Pt c/o chest pain substernal started when bending over at 9am-- has had pain across shoulders for past 2 days. Has had cardiac cath at [facility] approx 2 years ago-- no stents placed.

## 2017-04-02 NOTE — ED Provider Notes (Signed)
Medical screening examination/treatment/procedure(s) were conducted as a shared visit with non-physician practitioner(s) and myself.  I personally evaluated the patient during the encounter.  56 year old male presents with substernal chest pressure x2 today.  The first episode occurred when he bent over and had a sharp pain in his eyes for several seconds.  Resolve spontaneously and came back again today when he was standing up.  Pain starts at his left upper quadrant goes to his chest.  Pain is reproducible to palpation on his anterior chest wall.  Does have a history of a cardiac catheterization 2 years ago which according to him was normal.  EKG and chest x-ray and troponin reassuring.  Will cycle cardiac enzymes and likely discharge   Lacretia Leigh, MD 04/02/17 [phone]

## 2017-04-02 NOTE — ED Notes (Signed)
Wife going home

## 2017-04-02 NOTE — ED Notes (Signed)
The pt reports that the pain is unchanged.  Alert oriented   Wife at the bedside

## 2017-04-02 NOTE — ED Provider Notes (Signed)
[facility] EMERGENCY DEPARTMENT Provider Note   CSN: [id number] Arrival date & time: 04/02/17  1614     History   Chief Complaint Chief Complaint  Patient presents with  . Chest Pain    HPI Glenn Brandt is a 56 y.o. male.  Glenn Brandt is a 55 y.o. Male who presents to the ED complaining of chest pain beginning today.  Patient reports around 10 AM this morning he was bending over when he felt a sharp pain across the left side of his chest that lasted a second.   He reports now he is been having substernal nonradiating chest pain that he rates at a 7 out of 10. He had another sharp pain around 4 pm that lasted seconds.  He reports his pain is been constant it is sometimes worse when he sits forward.  He denies having any shortness of breath and no worsening pain with exertion.  No palpitations.  He does have a history of smoking, hyperlipidemia and hypertension.  He reports he is compliant with his medications.  No treatments attempted prior to arrival.  He does tell me he was seen for chest pain rule out previously and was sent for a cardiac catheterization 2 years ago.  He reports that his cardiac cath 2 years ago was clean.  He reports family history of a mother with an MI in her 55s.  He denies fevers, abdominal pain, nausea, vomiting, palpitations, shortness of breath, leg pain, leg swelling, recent long travel, lightheadedness, dizziness or syncope.   The history is provided by the patient, medical records and the spouse. No language interpreter was used.  Chest Pain   Pertinent negatives include no abdominal pain, no back pain, no cough, no fever, no headaches, no nausea, no numbness, no palpitations, no shortness of breath, no vomiting and no weakness.    Past Medical History:  Diagnosis Date  . Coronary artery disease   . High cholesterol   . Hypertension   . Myocardial disease ([place])     There are no active problems to display for this  patient.   History reviewed. No pertinent surgical history.     Home Medications    Prior to Admission medications   Medication Sig Start Date End Date Taking? Authorizing Provider  albuterol (PROVENTIL HFA;VENTOLIN HFA) 108 (90 BASE) MCG/ACT inhaler Inhale 2 puffs into the lungs 2 (two) times daily as needed for wheezing or shortness of breath.    Yes [provider]  aspirin 81 MG chewable tablet Chew 81 mg by mouth daily.    Yes [provider]  calcium carbonate (TUMS EX) 750 MG chewable tablet Chew 1-2 tablets by mouth daily as needed for heartburn.   Yes [provider]  clonazePAM (KLONOPIN) 1 MG tablet Take 1 mg by mouth 2 (two) times daily as needed for anxiety.   Yes [provider]  gabapentin (NEURONTIN) 100 MG capsule Take 200 mg by mouth at bedtime.   Yes [provider]  HYDROcodone-acetaminophen (NORCO/VICODIN) 5-325 MG tablet Take 1 tablet by mouth every 6 (six) hours as needed for moderate pain.   Yes [provider]  lisinopril (PRINIVIL,ZESTRIL) 20 MG tablet Take 10 mg by mouth every morning.   Yes [provider]  prazosin (MINIPRESS) 1 MG capsule Take 1 mg by mouth at bedtime.   Yes [provider]  simvastatin (ZOCOR) 20 MG tablet Take 10 mg by mouth at bedtime.   Yes [provider]  methocarbamol (ROBAXIN) 500 MG tablet Take 1 tablet (500 mg total) by mouth 2 (two) times daily as needed for muscle spasms. 04/02/17   [name], PA-C    Family History No family history on file.  Social History Social History   Tobacco Use  . Smoking status: Current Every Day Smoker    Types: Cigarettes  . Smokeless tobacco: Never Used  Substance Use Topics  . Alcohol use: Yes    Comment: socially  . Drug use: No     Allergies   Patient has no known allergies.   Review of Systems Review of Systems  Constitutional: Negative for chills and fever.  HENT: Negative for congestion and  sore throat.   Eyes: Negative for visual disturbance.  Respiratory: Negative for cough, shortness of breath and wheezing.   Cardiovascular: Positive for chest pain. Negative for palpitations and leg swelling.  Gastrointestinal: Negative for abdominal pain, diarrhea, nausea and vomiting.  Genitourinary: Negative for dysuria.  Musculoskeletal: Negative for back pain and neck pain.  Skin: Negative for rash.  Neurological: Negative for syncope, weakness, light-headedness, numbness and headaches.     Physical Exam Updated Vital Signs BP 112/79   Pulse 70   Temp 98.2 F (36.8 C) (Oral)   Resp 20   Ht 5\' 11"  (1.803 m)   Wt 84.4 kg (186 lb)   SpO2 100%   BMI 25.94 kg/m   Physical Exam  Constitutional: He appears well-developed and well-nourished.  Non-toxic appearance. He does not appear ill. No distress.  HENT:  Head: Normocephalic and atraumatic.  Mouth/Throat: Oropharynx is clear and moist.  Eyes: Conjunctivae are normal. Pupils are equal, round, and reactive to light. Right eye exhibits no discharge. Left eye exhibits no discharge.  Neck: Neck supple. No JVD present.  Cardiovascular: Normal rate, regular rhythm, normal heart sounds and intact distal pulses. Exam reveals no gallop and no friction rub.  No murmur heard. Pulses:      Radial pulses are 2+ on the right side, and 2+ on the left side.       Dorsalis pedis pulses are 2+ on the right side, and 2+ on the left side.       Posterior tibial pulses are 2+ on the right side, and 2+ on the left side.  Pulmonary/Chest: Effort normal and breath sounds normal. No tachypnea. No respiratory distress. He has no decreased breath sounds. He has no wheezes. He has no rales.  Lungs are clear to ascultation bilaterally. Symmetric chest expansion bilaterally. No increased work of breathing. No rales or rhonchi.  Left chest wall is TTP and reproduces his chest pain.   Abdominal: Soft. There is no tenderness.  Musculoskeletal: He exhibits no  edema.       Right lower leg: He exhibits no tenderness and no edema.       Left lower leg: He exhibits no tenderness and no edema.  Lymphadenopathy:    He has no cervical adenopathy.  Neurological: He is alert. Coordination normal.  Skin: Skin is warm and dry. Capillary refill takes less than 2 seconds. No rash noted. He is not diaphoretic. No erythema. No pallor.  Psychiatric: He has a normal mood and affect. His behavior is normal.  Nursing note and vitals reviewed.    ED Treatments / Results  Labs (all labs ordered are listed, but only abnormal results are displayed) Labs Reviewed  BASIC METABOLIC PANEL - Abnormal; Notable for the following components:      Result Value  Glucose, Bld 105 (*)    All other components within normal limits  CBC  I-STAT TROPONIN, ED  I-STAT TROPONIN, ED    EKG  EKG Interpretation  Date/Time:  Saturday April 02 2017 16:18:52 EST Ventricular Rate:  91 PR Interval:  188 QRS Duration: 162 QT Interval:  414 QTC Calculation: 509 R Axis:   -59 Text Interpretation:  Normal sinus rhythm Right bundle branch block Left anterior fascicular block  Bifascicular block  Left ventricular hypertrophy with repolarization abnormality Abnormal ECG No significant change since last tracing Confirmed by Lacretia Leigh ([id number]) on 04/02/2017 5:56:24 PM       Radiology Dg Chest 2 View  Result Date: 04/02/2017 CLINICAL DATA:  56 year old male with midsternal chest pain. EXAM: CHEST  2 VIEW COMPARISON:  05/12/2015 FINDINGS: The heart size and mediastinal contours are within normal limits. Note is made of left basilar atelectasis. The bilateral lungs are otherwise clear. The visualized skeletal structures are unremarkable. IMPRESSION: Left basilar atelectasis without active cardiopulmonary disease. Electronically Signed   By: Kristopher Oppenheim M.D.   On: 04/02/2017 16:56    Procedures Procedures (including critical care time)  Medications Ordered in  ED Medications  aspirin chewable tablet 324 mg (324 mg Oral Given 04/02/17 1752)  diazepam (VALIUM) tablet 5 mg (5 mg Oral Given 04/02/17 1836)     Initial Impression / Assessment and Plan / ED Course  I have reviewed the triage vital signs and the nursing notes.  Pertinent labs & imaging results that were available during my care of the patient were reviewed by me and considered in my medical decision making (see chart for details).     This is a 56 y.o. Male who presents to the ED complaining of chest pain beginning today.  Patient reports around 10 AM this morning he was bending over when he felt a sharp pain across the left side of his chest that lasted a second.   He reports now he is been having substernal nonradiating chest pain that he rates at a 7 out of 10. He had another sharp pain around 4 pm that lasted seconds.  He reports his pain is been constant it is sometimes worse when he sits forward.  He denies having any shortness of breath and no worsening pain with exertion.  No palpitations.  He does have a history of smoking, hyperlipidemia and hypertension.  He reports he is compliant with his medications.  No treatments attempted prior to arrival.  He does tell me he was seen for chest pain rule out previously and was sent for a cardiac catheterization 2 years ago.  He reports that his cardiac cath 2 years ago was clean.  On exam the patient is afebrile nontoxic-appearing.  His lungs are clear to auscultation bilaterally.  No friction rub.  No murmurs or gallops.  He has reproducible pain with palpation of his chest that reproduces chest pain.  No hypoxia, tachypnea or tachycardia on exam. EKG is unchanged from his last tracing. Chest x-ray shows left basilar atelectasis without active cardiopulmonary disease.  Blood work here is reassuring.  Initial troponin is not elevated. Delta troponin is also not elevated. At reevaluation following Valium for chest wall pain patient reports his  symptoms have completely resolved.  He is feeling well and ready for discharge.  Workup here is reassuring.  I have low suspicion for cardiac chest pain in this patient.  He has reproducible pain and this resolved with Valium.  Delta troponin is also  not elevated.  He will need follow-up with his cardiologist as he does have some risk factors.  No evidence of ACS at this time. I advised the patient to follow-up with their primary care provider this week. I advised the patient to return to the emergency department with new or worsening symptoms or new concerns. The patient verbalized understanding and agreement with plan.   This patient was discussed with and evaluated by Dr. Zenia Resides who agrees with assessment and plan.   Final Clinical Impressions(s) / ED Diagnoses   Final diagnoses:  Precordial pain  Chest wall pain    ED Discharge Orders        Ordered    methocarbamol (ROBAXIN) 500 MG tablet  2 times daily PRN     04/02/17 2110       [name], PA-C 04/02/17 2118

## 2018-06-28 ENCOUNTER — Encounter (HOSPITAL_COMMUNITY): Payer: Self-pay | Admitting: Emergency Medicine

## 2018-06-28 ENCOUNTER — Emergency Department (HOSPITAL_COMMUNITY): Payer: No Typology Code available for payment source

## 2018-06-28 ENCOUNTER — Emergency Department (HOSPITAL_COMMUNITY)
Admission: EM | Admit: 2018-06-28 | Discharge: 2018-06-28 | Disposition: A | Discharge status: Home / Self Care | Payer: No Typology Code available for payment source | Attending: Emergency Medicine | Admitting: Emergency Medicine

## 2018-06-28 ENCOUNTER — Other Ambulatory Visit: Payer: Self-pay

## 2018-06-28 DIAGNOSIS — Z79899 Other long term (current) drug therapy: Secondary | ICD-10-CM | POA: Insufficient documentation

## 2018-06-28 DIAGNOSIS — R0789 Other chest pain: Secondary | ICD-10-CM

## 2018-06-28 DIAGNOSIS — Z87891 Personal history of nicotine dependence: Secondary | ICD-10-CM | POA: Diagnosis not present

## 2018-06-28 DIAGNOSIS — I10 Essential (primary) hypertension: Secondary | ICD-10-CM | POA: Diagnosis not present

## 2018-06-28 DIAGNOSIS — Z7982 Long term (current) use of aspirin: Secondary | ICD-10-CM | POA: Diagnosis not present

## 2018-06-28 DIAGNOSIS — I2510 Atherosclerotic heart disease of native coronary artery without angina pectoris: Secondary | ICD-10-CM | POA: Insufficient documentation

## 2018-06-28 DIAGNOSIS — R079 Chest pain, unspecified: Secondary | ICD-10-CM | POA: Diagnosis present

## 2018-06-28 LAB — BASIC METABOLIC PANEL
ANION GAP: 9 (ref 5–15)
BUN: 10 mg/dL (ref 6–20)
CHLORIDE: 106 mmol/L (ref 98–111)
CO2: 24 mmol/L (ref 22–32)
CREATININE: 1.14 mg/dL (ref 0.61–1.24)
Calcium: 9.6 mg/dL (ref 8.9–10.3)
GFR calc Af Amer: >60 mL/min (ref >60)
GFR calc non Af Amer: >60 mL/min (ref >60)
Glucose, Bld: 99 mg/dL (ref 70–99)
POTASSIUM: 4.1 mmol/L (ref 3.5–5.1)
SODIUM: 139 mmol/L (ref 135–145)

## 2018-06-28 LAB — CBC
HCT: 43 % (ref 39.0–52.0)
HEMOGLOBIN: 14.2 g/dL (ref 13.0–17.0)
MCH: 28.3 pg (ref 26.0–34.0)
MCHC: 33 g/dL (ref 30.0–36.0)
MCV: 85.8 fL (ref 80.0–100.0)
NRBC: 0 % (ref 0.0–0.2)
Platelets: 247 10*3/uL (ref 150–400)
RBC: 5.01 MIL/uL (ref 4.22–5.81)
RDW: 14 % (ref 11.5–15.5)
WBC: 6.5 10*3/uL (ref 4.0–10.5)

## 2018-06-28 LAB — I-STAT TROPONIN, ED
TROPONIN I, POC: 0.01 ng/mL (ref 0.00–0.08)
TROPONIN I, POC: 0 ng/mL (ref 0.00–0.08)
Troponin i, poc: 0 ng/mL (ref 0.00–0.08)

## 2018-06-28 MED ORDER — SODIUM CHLORIDE 0.9% FLUSH: 3.0000 mL | Freq: Once | INTRAVENOUS | Status: DC

## 2018-06-28 NOTE — ED Provider Notes (Signed)
Medical screening examination/treatment/procedure(s) were conducted as a shared visit with non-physician practitioner(s) and myself.  I personally evaluated the patient during the encounter.  58 year old male presents with cough and congestion in left-sided sharp chest pain that lasts for seconds.  No other anginal qualities.  Patient is EKG without acute ischemic changes.  Chest x-ray without infiltrates.  Will delta troponin and discharged home   Lacretia Leigh, MD 06/28/18 1350

## 2018-06-28 NOTE — ED Triage Notes (Signed)
Pt with left side chest pain with rad to shoulder since Saturday. He also reports a dry cough that makes the chest pain worse. Denies SOB or Fever.

## 2018-06-28 NOTE — ED Notes (Signed)
Patient verbalizes understanding of discharge instructions. Opportunity for questioning and answers were provided. Armband removed by staff, pt discharged from ED.  

## 2018-06-28 NOTE — Discharge Instructions (Signed)
You were evaluated today for chest pain.  Your work-up was negative in department, however I do recommend following up with your primary care and cardiologist.  If you do not have a cardiologist I have recommended one on your discharge paperwork.  You need to call them to schedule an appointment.  Return to the ED for any worsening symptoms.

## 2018-06-28 NOTE — ED Provider Notes (Signed)
Glenn Brandt   CSN: [id number] Arrival date & time: 06/28/18  1010  History   Chief Complaint Chief Complaint  Patient presents with  . Chest Pain  . Cough    HPI Glenn Brandt is a 58 y.o. male with past medical history significant for ADD, hypertension, hypercholesterolemia presents for evaluation of chest pain and cough. Chest pain began Saturday, 4 days PTA. Pain is constant in nature located over his sternum. Described as throbbing and is worse with coughing.  She states she has pain began after he started with diffuse coughing.. Pain is constant in nature. Rates his pain a 6/10.  Patient states he is also had intermittent sharp left-sided chest pain the last year.  No exertional nature.  Patient states he will have a 3-second sharp chain under his left ribs.  Denies associated diaphoresis, lightheadedness, dizziness, shortness of breath.  Patient states he is pain has occasionally radiated over to his left arm.  Last sharp chest pain was 45 minutes PTA.  Previous cardiac cath and stress test approximately 5-6 years ago with negative results.  She has not followed by cardiology, however his PCP at the [facility] has ordered this testing.  Denies additional aggravating or alleviating factors.  Admits to history of hypertension, hypercholesterolemia, family hx of MI at 11.  Denies exertional chest pain, shortness of breath, pleuritic chest pain, hemoptysis, lower extremity swelling, radiation of pain to his back, lower extremity tenderness, swelling, fever, chills, nausea, vomiting, potation's, abdominal pain.  Patient states his sharp chest pain is worse when he reaches forward with his left arm.  History obtained from patient.  No interpreter was used.      HPI  Past Medical History:  Diagnosis Date  . Coronary artery disease   . High cholesterol   . Hypertension   . Myocardial disease ([place])     There are no active problems to display  for this patient.   History reviewed. No pertinent surgical history.      Home Medications    Prior to Admission medications   Medication Sig Start Date End Date Taking? Authorizing Provider  albuterol (PROVENTIL HFA;VENTOLIN HFA) 108 (90 BASE) MCG/ACT inhaler Inhale 2 puffs into the lungs 2 (two) times daily as needed for wheezing or shortness of breath.    Yes [provider]  aspirin 81 MG chewable tablet Chew 81 mg by mouth daily.    Yes [provider]  calcium carbonate (TUMS EX) 750 MG chewable tablet Chew 1-2 tablets by mouth daily as needed for heartburn.   Yes [provider]  cetirizine (ZYRTEC) 10 MG tablet Take 10 mg by mouth daily.   Yes [provider]  cyclobenzaprine (FLEXERIL) 10 MG tablet Take 10 mg by mouth at bedtime.   Yes [provider]  diclofenac sodium (VOLTAREN) 1 % GEL Apply 4 g topically 4 (four) times daily.   Yes [provider]  etodolac (LODINE) 400 MG tablet Take 400 mg by mouth 2 (two) times daily.   Yes [provider]  finasteride (PROSCAR) 5 MG tablet Take 5 mg by mouth daily.   Yes [provider]  HYDROcodone-acetaminophen (NORCO/VICODIN) 5-325 MG tablet Take 1 tablet by mouth 3 (three) times daily.    Yes [provider]  PARoxetine (PAXIL) 40 MG tablet Take 60 mg by mouth every evening. Taking 1 and 1/2 tablet =60mg    Yes [provider]  Phenyleph-Doxylamine-DM-APAP (NYQUIL SEVERE COLD/FLU) 5-6.25-10-325 MG/15ML LIQD  Take 15 mLs by mouth every 6 (six) hours as needed (cold symptoms).   Yes [provider]  prazosin (MINIPRESS) 1 MG capsule Take 3 mg by mouth at bedtime. Taking 3 (1mg  ) capsules at bedtime   Yes [provider]  predniSONE (DELTASONE) 50 MG tablet Take 50 mg by mouth daily with breakfast.   Yes [provider]  simvastatin (ZOCOR) 20 MG tablet Take 10 mg by mouth at bedtime.   Yes [provider]  tamsulosin  (FLOMAX) 0.4 MG CAPS capsule Take 0.8 mg by mouth every morning.   Yes [provider]  methocarbamol (ROBAXIN) 500 MG tablet Take 1 tablet (500 mg total) by mouth 2 (two) times daily as needed for muscle spasms. Patient not taking: Reported on 06/28/2018 04/02/17   [name], PA-C    Family History No family history on file.  Social History Social History   Tobacco Use  . Smoking status: Former Smoker    Types: Cigarettes  . Smokeless tobacco: Never Used  Substance Use Topics  . Alcohol use: Yes    Comment: socially  . Drug use: No     Allergies   Patient has no known allergies.   Review of Systems Review of Systems  Constitutional: Negative.   HENT: Negative.   Eyes: Negative.   Respiratory: Positive for cough. Negative for apnea, choking, chest tightness, shortness of breath, wheezing and stridor.   Cardiovascular: Positive for chest pain.  Gastrointestinal: Negative.   Genitourinary: Negative.   Musculoskeletal: Negative.   Skin: Negative.   Neurological: Negative.   All other systems reviewed and are negative.    Physical Exam Updated Vital Signs BP (!) 145/83   Pulse 81   Temp 98.6 F (37 C) (Oral)   Resp 18   Ht 5\' 11"  (1.803 m)   Wt 84.4 kg   SpO2 95%   BMI 25.94 kg/m   Physical Exam Vitals signs and nursing Brandt reviewed.  Constitutional:      General: He is not in acute distress.    Appearance: He is well-developed. He is not diaphoretic.  HENT:     Head: Atraumatic.  Eyes:     Pupils: Pupils are equal, round, and reactive to light.  Neck:     Musculoskeletal: Normal range of motion and neck supple.  Cardiovascular:     Rate and Rhythm: Normal rate and regular rhythm.     Comments: No carotid bruits.  No murmurs.  No JVD. Pulmonary:     Effort: Pulmonary effort is normal. No respiratory distress.     Comments: Clear to auscultation bilaterally without wheeze, rhonchi or rales.  No accessory muscle usage.  Able to speak in  full sentences without difficulty. Chest:     Comments: Chest pain reproducible with tenderness palpation to central and left chest. Abdominal:     General: There is no distension.     Palpations: Abdomen is soft.     Comments: Soft, nontender without rebound or guarding.  Musculoskeletal: Normal range of motion.     Comments: Moves all extremities without difficulty.  Ambulatory in department thought difficulty.  Skin:    General: Skin is warm and dry.     Comments: No Rashes or lesions.  Brisk capillary refill.  No Lower extremity edema, erythema or warmth.  Neurological:     Mental Status: He is alert.      ED Treatments / Results  Labs (all labs ordered are listed, but only abnormal results  are displayed) Labs Reviewed  BASIC METABOLIC PANEL  CBC  I-STAT TROPONIN, ED  I-STAT TROPONIN, ED  I-STAT TROPONIN, ED    EKG EKG Interpretation  Date/Time:  Wednesday June 28 2018 10:15:39 EST Ventricular Rate:  95 PR Interval:  192 QRS Duration: 152 QT Interval:  406 QTC Calculation: 510 R Axis:   -63 Text Interpretation:  Normal sinus rhythm Right bundle branch block Left anterior fascicular block  Bifascicular block Left ventricular hypertrophy with QRS widening and repolarization abnormality Abnormal ECG No significant change since last tracing Confirmed by Lacretia Leigh ([id number]) on 06/28/2018 12:23:05 PM   Radiology Dg Chest 2 View  Result Date: 06/28/2018 CLINICAL DATA:  Chest pain and cough EXAM: CHEST - 2 VIEW COMPARISON:  April 02, 2017 FINDINGS: The lungs are clear. The heart size and pulmonary vascularity are normal. No adenopathy. No pneumothorax. No bone lesions. IMPRESSION: No edema or consolidation. Electronically Signed   By: Lowella Grip III M.D.   On: 06/28/2018 10:52    Procedures Procedures (including critical care time)  Medications Ordered in ED Medications  sodium chloride flush (NS) 0.9 % injection 3 mL (has no administration in time  range)     Initial Impression / Assessment and Plan / ED Course  I have reviewed the triage vital signs and the nursing notes.  Pertinent labs & imaging results that were available during my care of the patient were reviewed by me and considered in my medical decision making (see chart for details).  58 year old male appears otherwise well presents for evaluation of chest pain.  Afebrile, nonseptic, non-ill-appearing.  Has substernal chest pain which she states began after he started with a nonproductive cough approximately 4 days PTA.  Pain is worse with coughing.  Also had intermittent sharp left-sided chest pain which lasts approximately 30 seconds and self resolves.  Worse when he reaches for with his left arm.  Chest pain is not exertional in nature.  No associated lightheadedness, dizziness, diaphoresis.  Does have history of hypertension hypercholesterolemia.  Had a previous heart cath done 2016 which per patient was clean.  I do not see records of this in his epic chart, however states this was done at the [facility] hospital.  Heart score 3, PERC, Wells criteria negative.  1230: On reevaluation patient states his pain is a 1/10 without medication.  Plan for delta troponin and reevaluate.  CBC without leukocytosis, Metabolic panel without renal, liver or electrolyte abnormality, chest x-ray negative for infiltrates, pulmonary edema, pneumothorax.  EKG similar to previous without ischemic changes.  1400: On reevaluation patient without any chest pain.  Patient with negative delta troponin. Low  suspicion for ACS, PE, dissection causing patient's symptoms.  Symptoms not consistent with pericarditis.  Patient is to be discharged with recommendation to follow up with PCP in regards to today's hospital visit. Chest pain is not likely of cardiac or pulmonary etiology d/t presentation, PERC negative, VSS, no tracheal deviation, no JVD or new murmur, RRR, breath sounds equal bilaterally, EKG without acute  abnormalities, negative troponin, and negative CXR. Pt has been advised to return to the ED if CP becomes exertional, associated with diaphoresis or nausea, radiates to left jaw/arm, worsens or becomes concerning in any way. Pt appears reliable for follow up and is agreeable to discharge.   Hemodynamically stable.  Patient has been seen evaluated by my attending physician Dr. Zenia Resides who agrees with the treatment, plan and disposition.     Final Clinical Impressions(s) / ED Diagnoses  Final diagnoses:  Atypical chest pain    ED Discharge Orders    None       Rayola Everhart A, PA-C 06/28/18 1715    Lacretia Leigh, MD 07/03/18 1720

## 2019-07-01 ENCOUNTER — Inpatient Hospital Stay (HOSPITAL_COMMUNITY)
Admission: EM | Admit: 2019-07-01 | Discharge: 2019-07-03 | DRG: 244 | Disposition: A | Discharge status: Home / Self Care | Payer: No Typology Code available for payment source | Attending: Internal Medicine | Admitting: Internal Medicine

## 2019-07-01 ENCOUNTER — Encounter (HOSPITAL_COMMUNITY): Payer: Self-pay | Admitting: Internal Medicine

## 2019-07-01 ENCOUNTER — Other Ambulatory Visit: Payer: Self-pay

## 2019-07-01 ENCOUNTER — Emergency Department (HOSPITAL_COMMUNITY): Payer: No Typology Code available for payment source

## 2019-07-01 DIAGNOSIS — Z7982 Long term (current) use of aspirin: Secondary | ICD-10-CM

## 2019-07-01 DIAGNOSIS — I2510 Atherosclerotic heart disease of native coronary artery without angina pectoris: Secondary | ICD-10-CM | POA: Diagnosis present

## 2019-07-01 DIAGNOSIS — Z791 Long term (current) use of non-steroidal anti-inflammatories (NSAID): Secondary | ICD-10-CM | POA: Diagnosis not present

## 2019-07-01 DIAGNOSIS — E785 Hyperlipidemia, unspecified: Secondary | ICD-10-CM | POA: Diagnosis present

## 2019-07-01 DIAGNOSIS — F419 Anxiety disorder, unspecified: Secondary | ICD-10-CM | POA: Diagnosis not present

## 2019-07-01 DIAGNOSIS — K219 Gastro-esophageal reflux disease without esophagitis: Secondary | ICD-10-CM | POA: Diagnosis present

## 2019-07-01 DIAGNOSIS — R55 Syncope and collapse: Secondary | ICD-10-CM | POA: Diagnosis present

## 2019-07-01 DIAGNOSIS — I10 Essential (primary) hypertension: Secondary | ICD-10-CM | POA: Diagnosis present

## 2019-07-01 DIAGNOSIS — Z7952 Long term (current) use of systemic steroids: Secondary | ICD-10-CM

## 2019-07-01 DIAGNOSIS — I442 Atrioventricular block, complete: Secondary | ICD-10-CM | POA: Diagnosis present

## 2019-07-01 DIAGNOSIS — Z20822 Contact with and (suspected) exposure to covid-19: Secondary | ICD-10-CM | POA: Diagnosis present

## 2019-07-01 DIAGNOSIS — Z87891 Personal history of nicotine dependence: Secondary | ICD-10-CM

## 2019-07-01 DIAGNOSIS — Z79899 Other long term (current) drug therapy: Secondary | ICD-10-CM

## 2019-07-01 DIAGNOSIS — F4310 Post-traumatic stress disorder, unspecified: Secondary | ICD-10-CM | POA: Diagnosis present

## 2019-07-01 DIAGNOSIS — C61 Malignant neoplasm of prostate: Secondary | ICD-10-CM | POA: Diagnosis present

## 2019-07-01 DIAGNOSIS — I441 Atrioventricular block, second degree: Secondary | ICD-10-CM

## 2019-07-01 DIAGNOSIS — I440 Atrioventricular block, first degree: Secondary | ICD-10-CM

## 2019-07-01 DIAGNOSIS — F329 Major depressive disorder, single episode, unspecified: Secondary | ICD-10-CM | POA: Diagnosis present

## 2019-07-01 DIAGNOSIS — Z959 Presence of cardiac and vascular implant and graft, unspecified: Secondary | ICD-10-CM

## 2019-07-01 DIAGNOSIS — E7800 Pure hypercholesterolemia, unspecified: Secondary | ICD-10-CM | POA: Diagnosis present

## 2019-07-01 HISTORY — DX: Post-traumatic stress disorder, unspecified: F43.10

## 2019-07-01 HISTORY — DX: Malignant neoplasm of prostate: C61

## 2019-07-01 LAB — URINALYSIS, ROUTINE W REFLEX MICROSCOPIC
Bilirubin Urine: NEGATIVE
Glucose, UA: NEGATIVE mg/dL
Hgb urine dipstick: NEGATIVE
Ketones, ur: NEGATIVE mg/dL
Leukocytes,Ua: NEGATIVE
Nitrite: NEGATIVE
Protein, ur: NEGATIVE mg/dL
Specific Gravity, Urine: 1.015 (ref 1.005–1.030)
pH: 6 (ref 5.0–8.0)

## 2019-07-01 LAB — CBC
HCT: 42.7 % (ref 39.0–52.0)
Hemoglobin: 14.2 g/dL (ref 13.0–17.0)
MCH: 29.5 pg (ref 26.0–34.0)
MCHC: 33.3 g/dL (ref 30.0–36.0)
MCV: 88.6 fL (ref 80.0–100.0)
Platelets: 241 10*3/uL (ref 150–400)
RBC: 4.82 MIL/uL (ref 4.22–5.81)
RDW: 14.2 % (ref 11.5–15.5)
WBC: 5.5 10*3/uL (ref 4.0–10.5)
nRBC: 0 % (ref 0.0–0.2)

## 2019-07-01 LAB — BASIC METABOLIC PANEL
Anion gap: 9 (ref 5–15)
BUN: 7 mg/dL (ref 6–20)
CO2: 24 mmol/L (ref 22–32)
Calcium: 9.3 mg/dL (ref 8.9–10.3)
Chloride: 107 mmol/L (ref 98–111)
Creatinine, Ser: 1.23 mg/dL (ref 0.61–1.24)
GFR calc Af Amer: >60 mL/min (ref >60)
GFR calc non Af Amer: >60 mL/min (ref >60)
Glucose, Bld: 102 mg/dL — ABNORMAL HIGH (ref 70–99)
Potassium: 4.1 mmol/L (ref 3.5–5.1)
Sodium: 140 mmol/L (ref 135–145)

## 2019-07-01 LAB — HEPATIC FUNCTION PANEL
ALT: 21 U/L (ref 0–44)
AST: 23 U/L (ref 15–41)
Albumin: 3.6 g/dL (ref 3.5–5.0)
Alkaline Phosphatase: 34 U/L — ABNORMAL LOW (ref 38–126)
Bilirubin, Direct: 0.1 mg/dL (ref 0.0–0.2)
Indirect Bilirubin: 0.5 mg/dL (ref 0.3–0.9)
Total Bilirubin: 0.6 mg/dL (ref 0.3–1.2)
Total Protein: 6.6 g/dL (ref 6.5–8.1)

## 2019-07-01 LAB — CBG MONITORING, ED: Glucose-Capillary: 96 mg/dL (ref 70–99)

## 2019-07-01 LAB — TSH: TSH: 0.894 u[IU]/mL (ref 0.350–4.500)

## 2019-07-01 LAB — BRAIN NATRIURETIC PEPTIDE: B Natriuretic Peptide: 203.4 pg/mL — ABNORMAL HIGH (ref 0.0–100.0)

## 2019-07-01 LAB — TROPONIN I (HIGH SENSITIVITY)
Troponin I (High Sensitivity): 12 ng/L (ref <18)
Troponin I (High Sensitivity): 14 ng/L (ref <18)

## 2019-07-01 LAB — SARS CORONAVIRUS 2 (TAT 6-24 HRS): SARS Coronavirus 2: NEGATIVE

## 2019-07-01 MED ORDER — DICLOFENAC SODIUM 1 % EX GEL: 4.0000 g | Freq: Four times a day (QID) | CUTANEOUS | Status: DC | PRN

## 2019-07-01 MED ORDER — ACETAMINOPHEN 325 MG PO TABS
650.0000 mg | ORAL_TABLET | Freq: Four times a day (QID) | ORAL | Status: DC | PRN
  Administered 2019-07-02: 650 mg via ORAL
  Filled 2019-07-01: qty 2

## 2019-07-01 MED ORDER — ONDANSETRON HCL 4 MG/2ML IJ SOLN: 4.0000 mg | Freq: Four times a day (QID) | INTRAMUSCULAR | Status: DC | PRN

## 2019-07-01 MED ORDER — SODIUM CHLORIDE 0.9% FLUSH
3.0000 mL | Freq: Once | INTRAVENOUS | Status: AC
  Administered 2019-07-01: 17:00:00 3 mL via INTRAVENOUS

## 2019-07-01 MED ORDER — SODIUM CHLORIDE 0.9 % IV SOLN: INTRAVENOUS | Status: DC

## 2019-07-01 MED ORDER — PANTOPRAZOLE SODIUM 40 MG PO TBEC
40.0000 mg | DELAYED_RELEASE_TABLET | Freq: Every day | ORAL | Status: DC
  Administered 2019-07-03: 40 mg via ORAL
  Filled 2019-07-01: qty 1

## 2019-07-01 MED ORDER — CYCLOBENZAPRINE HCL 10 MG PO TABS
10.0000 mg | ORAL_TABLET | Freq: Every day | ORAL | Status: DC
  Administered 2019-07-02: 10 mg via ORAL
  Filled 2019-07-01: qty 1

## 2019-07-01 MED ORDER — HYDROCODONE-ACETAMINOPHEN 5-325 MG PO TABS
1.0000 | ORAL_TABLET | Freq: Three times a day (TID) | ORAL | Status: DC | PRN
  Administered 2019-07-02 (×2): 1 via ORAL
  Filled 2019-07-01 (×2): qty 1

## 2019-07-01 MED ORDER — TAMSULOSIN HCL 0.4 MG PO CAPS
0.8000 mg | ORAL_CAPSULE | Freq: Every day | ORAL | Status: DC
  Administered 2019-07-03: 08:00:00 0.8 mg via ORAL
  Filled 2019-07-01: qty 2

## 2019-07-01 MED ORDER — PAROXETINE HCL 30 MG PO TABS
60.0000 mg | ORAL_TABLET | Freq: Every day | ORAL | Status: DC
  Administered 2019-07-02 (×2): 60 mg via ORAL
  Filled 2019-07-01 (×3): qty 2

## 2019-07-01 MED ORDER — ALBUTEROL SULFATE (2.5 MG/3ML) 0.083% IN NEBU: 3.0000 mL | INHALATION_SOLUTION | Freq: Four times a day (QID) | RESPIRATORY_TRACT | Status: DC | PRN

## 2019-07-01 MED ORDER — SIMVASTATIN 20 MG PO TABS
10.0000 mg | ORAL_TABLET | Freq: Every day | ORAL | Status: DC
  Administered 2019-07-02 (×2): 10 mg via ORAL
  Filled 2019-07-01 (×2): qty 1

## 2019-07-01 MED ORDER — ASPIRIN 81 MG PO CHEW
81.0000 mg | CHEWABLE_TABLET | Freq: Every day | ORAL | Status: DC
  Administered 2019-07-03: 08:00:00 81 mg via ORAL
  Filled 2019-07-01: qty 1

## 2019-07-01 MED ORDER — ACETAMINOPHEN 650 MG RE SUPP: 650.0000 mg | Freq: Four times a day (QID) | RECTAL | Status: DC | PRN

## 2019-07-01 MED ORDER — ONDANSETRON HCL 4 MG PO TABS: 4.0000 mg | ORAL_TABLET | Freq: Four times a day (QID) | ORAL | Status: DC | PRN

## 2019-07-01 NOTE — ED Notes (Signed)
Cardiologist at bedside. Plan for pacemaker and echo tomorrow. Pt is currently hemodynamically stable and not in emergent need of intervention per Cardiology.

## 2019-07-01 NOTE — ED Notes (Signed)
Pt given sandwich bag 

## 2019-07-01 NOTE — ED Notes (Signed)
Two extra golds and a lavender sent in anticipation of admit lab orders.

## 2019-07-01 NOTE — ED Notes (Signed)
Patient is back from X-ray.

## 2019-07-01 NOTE — ED Provider Notes (Addendum)
[facility] DEPART[facility] Provider Note   CSN: [id number] Arrival date & time: 07/01/19  1609     History Chief Complaint  Patient presents with  . Loss of Consciousness    Glenn Brandt is a 59 y.o. male.  Patient with about 4-5 syncopal episodes over the last week.  3 of them have been today.  Patient states that about a week ago he can tell that his heart was doing something funny.  One of the syncopal episodes today he fell backward striking the back of his head and hurting his left foot.  To the syncopal episodes occurred while still in bed.  Patient's oxygen saturation on arrival was 100% on room air.  Patient denies any headache or any neck pain.  No back pain.  Patient is followed by cardiology at the [facility].  Was told many years ago he had a small heart attack.  He does not have any stents and did have a stress test done at [facility] which everything looked fine.  So he is just somewhat monitoring care.  Past medical history is significant for hypertension high cholesterol.  And patient a former smoker.  Not clear when he last quit.  EKGs done out in triage raise concerns for a second-degree heart block.  EKG and cardiac monitoring done when he got back here was more consistent with first-degree AV block.  This possibly could be moving in and out.  Patient without any significant other EKG changes.  Old EKGs have shown evidence of right bundle branch block and left anterior fascicular block.  No real chest pain associated with the syncope.  However patient started to be out of tell that it was going to come on.  Patient never was out for a long period of time.  Patient is not exactly sure how long he was out.  But it certainly would have been less than 5 minutes.  With all the episodes.        Past Medical History:  Diagnosis Date  . Coronary artery disease   . High cholesterol   . Hypertension   . Myocardial disease ([place])     There are no problems  to display for this patient.   No past surgical history on file.     No family history on file.  Social History   Tobacco Use  . Smoking status: Former Smoker    Types: Cigarettes  . Smokeless tobacco: Never Used  Substance Use Topics  . Alcohol use: Yes    Comment: socially  . Drug use: No    Home Medications Prior to Admission medications   Medication Sig Start Date End Date Taking? Authorizing Provider  albuterol (PROVENTIL HFA;VENTOLIN HFA) 108 (90 BASE) MCG/ACT inhaler Inhale 2 puffs into the lungs 2 (two) times daily as needed for wheezing or shortness of breath.     [provider]  aspirin 81 MG chewable tablet Chew 81 mg by mouth daily.     [provider]  calcium carbonate (TUMS EX) 750 MG chewable tablet Chew 1-2 tablets by mouth daily as needed for heartburn.    [provider]  cetirizine (ZYRTEC) 10 MG tablet Take 10 mg by mouth daily.    [provider]  cyclobenzaprine (FLEXERIL) 10 MG tablet Take 10 mg by mouth at bedtime.    [provider]  diclofenac sodium (VOLTAREN) 1 % GEL Apply 4 g topically 4 (four) times daily.    [provider]  etodolac (LODINE) 400 MG tablet Take 400 mg by mouth 2 (two) times daily.    [provider]  finasteride (PROSCAR) 5 MG tablet Take 5 mg by mouth daily.    [provider]  HYDROcodone-acetaminophen (NORCO/VICODIN) 5-325 MG tablet Take 1 tablet by mouth 3 (three) times daily.     [provider]  methocarbamol (ROBAXIN) 500 MG tablet Take 1 tablet (500 mg total) by mouth 2 (two) times daily as needed for muscle spasms. Patient not taking: Reported on 06/28/2018 04/02/17   [name], PA-C  PARoxetine (PAXIL) 40 MG tablet Take 60 mg by mouth every evening. Taking 1 and 1/2 tablet =60mg     [provider]  Phenyleph-Doxylamine-DM-APAP (NYQUIL SEVERE COLD/FLU) 5-6.25-10-325 MG/15ML LIQD Take 15 mLs by mouth every 6 (six) hours as needed  (cold symptoms).    [provider]  prazosin (MINIPRESS) 1 MG capsule Take 3 mg by mouth at bedtime. Taking 3 (1mg  ) capsules at bedtime    [provider]  predniSONE (DELTASONE) 50 MG tablet Take 50 mg by mouth daily with breakfast.    [provider]  simvastatin (ZOCOR) 20 MG tablet Take 10 mg by mouth at bedtime.    [provider]  tamsulosin (FLOMAX) 0.4 MG CAPS capsule Take 0.8 mg by mouth every morning.    [provider]    Allergies    Patient has no known allergies.  Review of Systems   Review of Systems  Constitutional: Negative for chills and fever.  HENT: Negative for rhinorrhea and sore throat.   Eyes: Negative for visual disturbance.  Respiratory: Negative for cough and shortness of breath.   Cardiovascular: Positive for palpitations. Negative for chest pain and leg swelling.  Gastrointestinal: Negative for abdominal pain, diarrhea, nausea and vomiting.  Genitourinary: Negative for dysuria.  Musculoskeletal: Negative for back pain and neck pain.  Skin: Negative for rash.  Neurological: Positive for syncope. Negative for dizziness, light-headedness and headaches.  Hematological: Does not bruise/bleed easily.  Psychiatric/Behavioral: Negative for confusion.    Physical Exam Updated Vital Signs BP (!) 156/76   Pulse (!) 30   Temp 98.8 F (37.1 C) (Oral)   Resp (!) 21   SpO2 95%   Physical Exam Vitals and nursing note reviewed.  Constitutional:      Appearance: Normal appearance. He is well-developed.  HENT:     Head: Normocephalic and atraumatic.  Eyes:     Extraocular Movements: Extraocular movements intact.     Conjunctiva/sclera: Conjunctivae normal.     Pupils: Pupils are equal, round, and reactive to light.  Cardiovascular:     Rate and Rhythm: Regular rhythm. Bradycardia present.     Heart sounds: No murmur.  Pulmonary:     Effort: Pulmonary effort is normal. No respiratory distress.     Breath sounds:  Normal breath sounds.  Abdominal:     Palpations: Abdomen is soft.     Tenderness: There is no abdominal tenderness.  Musculoskeletal:        General: Tenderness present. No swelling. Normal range of motion.     Cervical back: Normal range of motion and neck supple.     Comments: To the top of the left foot.  No swelling to the ankle.  Good cap refill.  Skin:    General: Skin is warm and dry.     Capillary Refill: Capillary refill takes less than 2 seconds.  Neurological:     General: No focal deficit present.  Mental Status: He is alert.     Cranial Nerves: No cranial nerve deficit.     Sensory: No sensory deficit.     Motor: No weakness.     ED Results / Procedures / Treatments   Labs (all labs ordered are listed, but only abnormal results are displayed) Labs Reviewed  BASIC METABOLIC PANEL - Abnormal; Notable for the following components:      Result Value   Glucose, Bld 102 (*)    All other components within normal limits  BRAIN NATRIURETIC PEPTIDE - Abnormal; Notable for the following components:   B Natriuretic Peptide 203.4 (*)    All other components within normal limits  HEPATIC FUNCTION PANEL - Abnormal; Notable for the following components:   Alkaline Phosphatase 34 (*)    All other components within normal limits  SARS CORONAVIRUS 2 (TAT 6-24 HRS)  CBC  URINALYSIS, ROUTINE W REFLEX MICROSCOPIC  TSH  CBG MONITORING, ED  TROPONIN I (HIGH SENSITIVITY)  TROPONIN I (HIGH SENSITIVITY)    EKG EKG Interpretation  Date/Time:  Sunday July 01 2019 16:10:38 EST Ventricular Rate:  35 PR Interval:  366 QRS Duration: 160 QT Interval:  566 QTC Calculation: 432 R Axis:   -63 Text Interpretation: Marked sinus bradycardia with 1st degree A-V block Possible Left atrial enlargement Right bundle branch block Left anterior fascicular block  Bifascicular block  Minimal voltage criteria for LVH, may be normal variant ( R in aVL ) T wave abnormality, consider  inferolateral ischemia Abnormal ECG Confirmed by [name] [phone]) on 07/01/2019 4:48:36 PM   Radiology DG Chest 1 View  Result Date: 07/01/2019 CLINICAL DATA:  59 year old male with history of multiple syncopal episodes of the past 2 weeks. EXAM: CHEST  1 VIEW COMPARISON:  Chest x-ray 06/28/2018. FINDINGS: Lung volumes are normal. No consolidative airspace disease. No pleural effusions. No pneumothorax. No pulmonary nodule or mass noted. Pulmonary vasculature and the cardiomediastinal silhouette are within normal limits. IMPRESSION: No radiographic evidence of acute cardiopulmonary disease. Electronically Signed   By: Vinnie Langton M.D.   On: 07/01/2019 17:42   CT Head Wo Contrast  Result Date: 07/01/2019 CLINICAL DATA:  59 year old male with syncope. EXAM: CT HEAD WITHOUT CONTRAST TECHNIQUE: Contiguous axial images were obtained from the base of the skull through the vertex without intravenous contrast. COMPARISON:  None. FINDINGS: Brain: The ventricles and sulci appropriate size for patient's age. The gray-white matter discrimination is preserved. There is no acute intracranial hemorrhage. No mass effect or midline shift. No extra-axial fluid collection Vascular: No hyperdense vessel or unexpected calcification. Skull: Normal. Negative for fracture or focal lesion. Sinuses/Orbits: No acute finding. Other: None IMPRESSION: No acute intracranial pathology. Electronically Signed   By: Anner Crete M.D.   On: 07/01/2019 18:17   DG Foot Complete Left  Result Date: 07/01/2019 CLINICAL DATA:  59 year old male with fall and left foot pain. EXAM: LEFT FOOT - COMPLETE 3+ VIEW COMPARISON:  None. FINDINGS: There is no acute fracture or dislocation. No significant arthritic changes. The soft tissues are unremarkable. IMPRESSION: Negative. Electronically Signed   By: Anner Crete M.D.   On: 07/01/2019 17:43    Procedures Procedures (including critical care time)  CRITICAL CARE Performed  by: [name] Total critical care time: 30 minutes Critical care time was exclusive of separately billable procedures and treating other patients. Critical care was necessary to treat or prevent imminent or life-threatening deterioration. Critical care was time spent personally by me on the following activities: development of  treatment plan with patient and/or surrogate as well as nursing, discussions with consultants, evaluation of patient's response to treatment, examination of patient, obtaining history from patient or surrogate, ordering and performing treatments and interventions, ordering and review of laboratory studies, ordering and review of radiographic studies, pulse oximetry and re-evaluation of patient's condition.   Medications Ordered in ED Medications  0.9 %  sodium chloride infusion ( Intravenous New Bag/Given 07/01/19 1739)  sodium chloride flush (NS) 0.9 % injection 3 mL (3 mLs Intravenous Given 07/01/19 1633)    ED Course  I have reviewed the triage vital signs and the nursing notes.  Pertinent labs & imaging results that were available during my care of the patient were reviewed by me and considered in my medical decision making (see chart for details).    MDM Rules/Calculators/A&P                      Patient when he got back here and put on cardiac monitor it appeared to be more of first-degree AV block.  We did not have any irregular beats and did not seem to be any dropped beats.  But review of the EKGs from triage raise some concerns for a second-degree block and there did seem to be some dropped beats.  Recently reviewing the cardiac monitor here now the patient is back from head CT and if talking to cardiology.  We are seeing kind of a transition from first-degree AV block sometimes into what looks like more of a second-degree AV block and then kind of back.  So I think he is moving in and out varying block degree.  Patient denies any history of any significant  chest pain in the last week or 2 weeks ago.  All the symptoms really started about a week ago.  Discussed with cardiology on-call there can take a look at the EKGs and the rhythm and will see him in consultation.  Will get hospitalist to admit.  Not sure if I made it clear to cardiology that in the past you know he has had bundle branch block findings on his EKG they are still present today without any significant changes really from the past other than this bradycardia and the questionable either first-degree AV block and then some components of second-degree AV block.  But here patient has been stable there is been no syncopal episodes.  Work-up for the fall showed CT head was negative x-rays left foot were negative labs without any significant abnormalities.  Chest x-ray negative BNP was up a little bit.  Initial troponin was 12-second troponin is pending. Final Clinical Impression(s) / ED Diagnoses Final diagnoses:  Syncope and collapse  First degree AV block    Rx / DC Orders ED Discharge Orders    None       [name], MD 07/01/19 1904   Addendum patient seen by cardiology I have evaluated him he is now clearly in a complete heart block.  They felt that the previous rhythms were second degree heart block where he was dropping every other beat.  Cardiology is thinking patient will need pacemaker probably tomorrow I do not feel it needs to be done tonight.    [name], MD 07/01/19 [id number]
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
## 2019-07-01 NOTE — ED Triage Notes (Signed)
Pt here for evaluation of multiple syncopal episodes over the last two weeks. Pt had two syncopal episodes today, once falling and striking the back of his head and hurting his L foot. A/o x 4 on arrival.
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
## 2019-07-01 NOTE — ED Notes (Signed)
Pt reports another episode of numbness. He does not believe this episode resulted in a syncopal episode but does report the numbness from his feet to his head, and dizziness once the numbness got to his head.

## 2019-07-01 NOTE — ED Notes (Signed)
Pt to CT

## 2019-07-01 NOTE — ED Notes (Signed)
Pt back from CT

## 2019-07-01 NOTE — ED Notes (Addendum)
Patient transported to x-ray. ?

## 2019-07-01 NOTE — ED Notes (Signed)
Patient placed on Zoll pads.

## 2019-07-02 ENCOUNTER — Inpatient Hospital Stay (HOSPITAL_COMMUNITY): Payer: No Typology Code available for payment source

## 2019-07-02 ENCOUNTER — Inpatient Hospital Stay (HOSPITAL_COMMUNITY)
Admission: EM | Disposition: A | Discharge status: Home / Self Care | Payer: Self-pay | Source: Home / Self Care | Attending: Internal Medicine

## 2019-07-02 DIAGNOSIS — I10 Essential (primary) hypertension: Secondary | ICD-10-CM

## 2019-07-02 DIAGNOSIS — I442 Atrioventricular block, complete: Secondary | ICD-10-CM

## 2019-07-02 DIAGNOSIS — C61 Malignant neoplasm of prostate: Secondary | ICD-10-CM

## 2019-07-02 DIAGNOSIS — F329 Major depressive disorder, single episode, unspecified: Secondary | ICD-10-CM

## 2019-07-02 DIAGNOSIS — F419 Anxiety disorder, unspecified: Secondary | ICD-10-CM

## 2019-07-02 DIAGNOSIS — K219 Gastro-esophageal reflux disease without esophagitis: Secondary | ICD-10-CM

## 2019-07-02 DIAGNOSIS — R55 Syncope and collapse: Secondary | ICD-10-CM

## 2019-07-02 HISTORY — PX: PACEMAKER IMPLANT: EP1218

## 2019-07-02 LAB — ECHOCARDIOGRAM COMPLETE
Height: 71 in
Weight: 3089.97 oz

## 2019-07-02 LAB — BASIC METABOLIC PANEL
Anion gap: 10 (ref 5–15)
BUN: 6 mg/dL (ref 6–20)
CO2: 21 mmol/L — ABNORMAL LOW (ref 22–32)
Calcium: 9 mg/dL (ref 8.9–10.3)
Chloride: 109 mmol/L (ref 98–111)
Creatinine, Ser: 1.11 mg/dL (ref 0.61–1.24)
GFR calc Af Amer: >60 mL/min (ref >60)
GFR calc non Af Amer: >60 mL/min (ref >60)
Glucose, Bld: 97 mg/dL (ref 70–99)
Potassium: 4 mmol/L (ref 3.5–5.1)
Sodium: 140 mmol/L (ref 135–145)

## 2019-07-02 LAB — GLUCOSE, CAPILLARY: Glucose-Capillary: 97 mg/dL (ref 70–99)

## 2019-07-02 LAB — MRSA PCR SCREENING: MRSA by PCR: NEGATIVE

## 2019-07-02 LAB — HIV ANTIBODY (ROUTINE TESTING W REFLEX): HIV Screen 4th Generation wRfx: NONREACTIVE

## 2019-07-02 SURGERY — PACEMAKER IMPLANT

## 2019-07-02 MED ORDER — LIDOCAINE HCL (PF) 1 % IJ SOLN
INTRAMUSCULAR | Status: DC | PRN
  Administered 2019-07-02: 50 mL

## 2019-07-02 MED ORDER — FENTANYL CITRATE (PF) 100 MCG/2ML IJ SOLN
INTRAMUSCULAR | Status: DC | PRN
  Administered 2019-07-02: 25 ug via INTRAVENOUS

## 2019-07-02 MED ORDER — LIDOCAINE HCL 1 % IJ SOLN
INTRAMUSCULAR | Status: AC
  Filled 2019-07-02: qty 20

## 2019-07-02 MED ORDER — ONDANSETRON HCL 4 MG/2ML IJ SOLN: 4.0000 mg | Freq: Four times a day (QID) | INTRAMUSCULAR | Status: DC | PRN

## 2019-07-02 MED ORDER — HEPARIN (PORCINE) IN NACL 1000-0.9 UT/500ML-% IV SOLN
INTRAVENOUS | Status: DC | PRN
  Administered 2019-07-02: 500 mL

## 2019-07-02 MED ORDER — MIDAZOLAM HCL 5 MG/5ML IJ SOLN
INTRAMUSCULAR | Status: DC | PRN
  Administered 2019-07-02: 2 mg via INTRAVENOUS

## 2019-07-02 MED ORDER — FENTANYL CITRATE (PF) 100 MCG/2ML IJ SOLN
INTRAMUSCULAR | Status: AC
  Filled 2019-07-02: qty 2

## 2019-07-02 MED ORDER — CEFAZOLIN SODIUM-DEXTROSE 1-4 GM/50ML-% IV SOLN
1.0000 g | Freq: Four times a day (QID) | INTRAVENOUS | Status: AC
  Administered 2019-07-02 – 2019-07-03 (×2): 1 g via INTRAVENOUS
  Filled 2019-07-02 (×3): qty 50

## 2019-07-02 MED ORDER — MIDAZOLAM HCL 5 MG/5ML IJ SOLN
INTRAMUSCULAR | Status: AC
  Filled 2019-07-02: qty 5

## 2019-07-02 MED ORDER — SODIUM CHLORIDE 0.9 % IV SOLN: INTRAVENOUS | Status: DC

## 2019-07-02 MED ORDER — SODIUM CHLORIDE 0.9 % IV SOLN
INTRAVENOUS | Status: AC
  Filled 2019-07-02: qty 2

## 2019-07-02 MED ORDER — LIDOCAINE HCL (PF) 1 % IJ SOLN
INTRAMUSCULAR | Status: AC
  Filled 2019-07-02: qty 60

## 2019-07-02 MED ORDER — CEFAZOLIN SODIUM-DEXTROSE 2-4 GM/100ML-% IV SOLN
2.0000 g | INTRAVENOUS | Status: AC
  Administered 2019-07-02: 10:00:00 2 g via INTRAVENOUS

## 2019-07-02 MED ORDER — CEFAZOLIN SODIUM-DEXTROSE 2-4 GM/100ML-% IV SOLN
INTRAVENOUS | Status: AC
  Filled 2019-07-02: qty 100

## 2019-07-02 MED ORDER — IOHEXOL 350 MG/ML SOLN
INTRAVENOUS | Status: DC | PRN
  Administered 2019-07-02: 10 mL
  Administered 2019-07-02: 10 mL via INTRAVENOUS

## 2019-07-02 MED ORDER — DOPAMINE-DEXTROSE 3.2-5 MG/ML-% IV SOLN
5.0000 ug/kg/min | INTRAVENOUS | Status: DC
  Administered 2019-07-02: 5 ug/kg/min via INTRAVENOUS

## 2019-07-02 MED ORDER — CHLORHEXIDINE GLUCONATE 4 % EX LIQD: 60.0000 mL | Freq: Once | CUTANEOUS | Status: DC

## 2019-07-02 MED ORDER — SODIUM CHLORIDE 0.9 % IV SOLN
80.0000 mg | INTRAVENOUS | Status: AC
  Administered 2019-07-02: 80 mg

## 2019-07-02 MED ORDER — HEPARIN (PORCINE) IN NACL 1000-0.9 UT/500ML-% IV SOLN
INTRAVENOUS | Status: AC
  Filled 2019-07-02: qty 500

## 2019-07-02 MED ORDER — ACETAMINOPHEN 325 MG PO TABS: 325.0000 mg | ORAL_TABLET | ORAL | Status: DC | PRN

## 2019-07-02 MED ORDER — DOPAMINE-DEXTROSE 3.2-5 MG/ML-% IV SOLN
5.0000 ug/kg/min | INTRAVENOUS | Status: DC
  Filled 2019-07-02: qty 250

## 2019-07-02 SURGICAL SUPPLY — 9 items
CABLE SURGICAL S-101-97-12 (CABLE) ×6 IMPLANT
HEMOSTAT SURGICEL 2X4 FIBR (HEMOSTASIS) ×3 IMPLANT
IPG PACE AZUR XT DR MRI W1DR01 (Pacemaker) ×1 IMPLANT
LEAD CAPSURE NOVUS 5076-52CM (Lead) ×3 IMPLANT
LEAD CAPSURE NOVUS 5076-58CM (Lead) ×3 IMPLANT
PACE AZURE XT DR MRI W1DR01 (Pacemaker) ×3 IMPLANT
PAD PRO RADIOLUCENT 2001M-C (PAD) ×3 IMPLANT
SHEATH 7FR PRELUDE SNAP 13 (SHEATH) ×6 IMPLANT
TRAY PACEMAKER INSERTION (PACKS) ×3 IMPLANT

## 2019-07-02 NOTE — Progress Notes (Signed)
  Echocardiogram 2D Echocardiogram has been performed.  Jennette Dubin 07/02/2019, 8:31 AM
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
## 2019-07-02 NOTE — Progress Notes (Signed)
Pt having ventricular standstills and ringing out asystole. Longest standstill was 14.86 secs. Pt is symptomatic stating he feels like he blacked out. Cardiology MD paged and new orders received. Will continue to monitor.

## 2019-07-02 NOTE — Progress Notes (Signed)
WILL BEGIN with no sedation given heart block and then proceed from there

## 2019-07-02 NOTE — Progress Notes (Signed)
[name]  PROGRESS NOTE    Glenn Brandt  [id number] DOB: 06-16-1960 DOA: 07/01/2019 PCP: [facility]   Brief Narrative:   Glenn Brandt is a 59 y.o. male with medical history significant of HTN, HLD, prostate CA, PTSD. Questionable h/o CAD: Has remote H/O LHC at [place], told everything "looked okay" but "may have had a prior heart attack". Pt presents to ED after having 4-5 syncopal episodes over the past week.  3 today.  Pt says he will have onset of total-body "numbness", that works its way from feet to head, with associated lightheadedness and dizziness. Ultimately he will lose consciousness.  ED Course: CT head neg. EKG reveals HR in 30s with variable AV block.  EKG in system shows a 2nd degree 2:1 block with a rate in the 30s, but he is also noted to have CHB (3rd degree) on the monitor at the time cards is seeing him in the ED. Asymptomatic while laying in bed.  BP 140s-150s.  Kidneys look okay on labs.  07/02/19: S/p pacer. Watch ON per protocol. BP is up a little but might not be accurate reading. Start ACEi if trend continues.    Assessment & Plan:   Principal Problem:   CHB (complete heart block) (HCC) Active Problems:   Syncope, cardiogenic   HTN (hypertension)   Prostate cancer (HCC)  Complete Heart Block Recurrent syncope Symptomatic bradycardia     - now s/p PPM     - remain on tele monitor     - echo results noted  HTN     - no home BP meds lists     - start ACEi if necessary  Hx of CAD?     - on asa, zocor     - denies CP     - Trp neg x2  Prostate CA     - Follows with [facility].  GERD     - PPI  Anxiety/depression     - paxil  DVT prophylaxis: SCDs Code Status: FULL Family Communication: None at bedside   Disposition Plan: Likely home in AM  Consultants:   Cardiology  Procedures:   Pacer placement   ROS:  Denies CP, N, V, ab pain . Remainder 10-pt ROS is negative for all not previously mentioned.  Subjective: "It's  gonna be hard eating with this thing."  Objective: Vitals:   07/02/19 1051 07/02/19 1056 07/02/19 1101 07/02/19 1224  BP: (!) 171/87 (!) 143/79 118/80   Pulse: 64 74 74 67  Resp: 12 11 (!) 28 19  Temp:    98.1 F (36.7 C)  TempSrc:    Oral  SpO2: 96% 97% 96% 98%  Weight:      Height:        Intake/Output Summary (Last 24 hours) at 07/02/2019 1445 Last data filed at 07/02/2019 [id number] Gross per 24 hour  Intake 22.12 ml  Output 1000 ml  Net -977.88 ml   Filed Weights   07/01/19 2042  Weight: 87.6 kg    Examination:  General: 59 y.o. male resting in bed in NAD Cardiovascular: RRR, +S1, S2, no m/g/r, equal pulses throughout Respiratory: CTABL, no w/r/r, normal WOB GI: BS+, NDNT, no masses noted, no organomegaly noted MSK: No e/c/c, Left should sling Neuro: A&O x 3, no focal deficits Psyc: Appropriate interaction and affect, calm/cooperative   Data Reviewed: I have personally reviewed following labs and imaging studies.  CBC: Recent Labs  Lab 07/01/19 1618  WBC 5.5  HGB 14.2  HCT 42.7  MCV 88.6  PLT [id number]   Basic Metabolic Panel: Recent Labs  Lab 07/01/19 1618 07/02/19 0141  NA 140 140  K 4.1 4.0  CL 107 109  CO2 24 21*  GLUCOSE 102* 97  BUN 7 6  CREATININE 1.23 1.11  CALCIUM 9.3 9.0   GFR: Estimated Creatinine Clearance: 75.4 mL/min (by C-G formula based on SCr of 1.11 mg/dL). Liver Function Tests: Recent Labs  Lab 07/01/19 1618  AST 23  ALT 21  ALKPHOS 34*  BILITOT 0.6  PROT 6.6  ALBUMIN 3.6   No results for input(s): LIPASE, AMYLASE in the last 168 hours. No results for input(s): AMMONIA in the last 168 hours. Coagulation Profile: No results for input(s): INR, PROTIME in the last 168 hours. Cardiac Enzymes: No results for input(s): CKTOTAL, CKMB, CKMBINDEX, TROPONINI in the last 168 hours. BNP (last 3 results) No results for input(s): PROBNP in the last 8760 hours. HbA1C: No results for input(s): HGBA1C in the last 72 hours. CBG: Recent  Labs  Lab 07/01/19 1617 07/02/19 1222  GLUCAP 96 97   Lipid Profile: No results for input(s): CHOL, HDL, LDLCALC, TRIG, CHOLHDL, LDLDIRECT in the last 72 hours. Thyroid Function Tests: Recent Labs    07/01/19 1854  TSH 0.894   Anemia Panel: No results for input(s): VITAMINB12, FOLATE, FERRITIN, TIBC, IRON, RETICCTPCT in the last 72 hours. Sepsis Labs: No results for input(s): PROCALCITON, LATICACIDVEN in the last 168 hours.  Recent Results (from the past 240 hour(s))  SARS CORONAVIRUS 2 (TAT 6-24 HRS) Nasopharyngeal Nasopharyngeal Swab     Status: None   Collection Time: 07/01/19  5:34 PM   Specimen: Nasopharyngeal Swab  Result Value Ref Range Status   SARS Coronavirus 2 NEGATIVE NEGATIVE Final    Comment: (NOTE) SARS-CoV-2 target nucleic acids are NOT DETECTED. The SARS-CoV-2 RNA is generally detectable in upper and lower respiratory specimens during the acute phase of infection. Negative results do not preclude SARS-CoV-2 infection, do not rule out co-infections with other pathogens, and should not be used as the sole basis for treatment or other patient management decisions. Negative results must be combined with clinical observations, patient history, and epidemiological information. The expected result is Negative. Fact Sheet for Patients: SugarRoll.be Fact Sheet for Healthcare Providers: https://www.woods-mathews.com/ This test is not yet approved or cleared by the [place] FDA and  has been authorized for detection and/or diagnosis of SARS-CoV-2 by FDA under an Emergency Use Authorization (EUA). This EUA will remain  in effect (meaning this test can be used) for the duration of the COVID-19 declaration under Section 56 4(b)(1) of the Act, 21 U.S.C. section 360bbb-3(b)(1), unless the authorization is terminated or revoked sooner. Performed at [facility], [place] [address] [place], [place] [address]   MRSA PCR  Screening     Status: None   Collection Time: 07/01/19  9:20 PM   Specimen: Nasopharyngeal  Result Value Ref Range Status   MRSA by PCR NEGATIVE NEGATIVE Final    Comment:        The GeneXpert MRSA Assay (FDA approved for NASAL specimens only), is one component of a comprehensive MRSA colonization surveillance program. It is not intended to diagnose MRSA infection nor to guide or monitor treatment for MRSA infections. Performed at [facility], [place] [address] [place], [place] [address]       Radiology Studies: DG Chest 1 View  Result Date: 07/01/2019 CLINICAL DATA:  59 year old male with history of multiple  syncopal episodes of the past 2 weeks. EXAM: CHEST  1 VIEW COMPARISON:  Chest x-ray 06/28/2018. FINDINGS: Lung volumes are normal. No consolidative airspace disease. No pleural effusions. No pneumothorax. No pulmonary nodule or mass noted. Pulmonary vasculature and the cardiomediastinal silhouette are within normal limits. IMPRESSION: No radiographic evidence of acute cardiopulmonary disease. Electronically Signed   By: Vinnie Langton M.D.   On: 07/01/2019 17:42   CT Head Wo Contrast  Result Date: 07/01/2019 CLINICAL DATA:  59 year old male with syncope. EXAM: CT HEAD WITHOUT CONTRAST TECHNIQUE: Contiguous axial images were obtained from the base of the skull through the vertex without intravenous contrast. COMPARISON:  None. FINDINGS: Brain: The ventricles and sulci appropriate size for patient's age. The gray-white matter discrimination is preserved. There is no acute intracranial hemorrhage. No mass effect or midline shift. No extra-axial fluid collection Vascular: No hyperdense vessel or unexpected calcification. Skull: Normal. Negative for fracture or focal lesion. Sinuses/Orbits: No acute finding. Other: None IMPRESSION: No acute intracranial pathology. Electronically Signed   By: Anner Crete M.D.   On: 07/01/2019 18:17   DG Foot Complete Left  Result Date:  07/01/2019 CLINICAL DATA:  60 year old male with fall and left foot pain. EXAM: LEFT FOOT - COMPLETE 3+ VIEW COMPARISON:  None. FINDINGS: There is no acute fracture or dislocation. No significant arthritic changes. The soft tissues are unremarkable. IMPRESSION: Negative. Electronically Signed   By: Anner Crete M.D.   On: 07/01/2019 17:43   ECHOCARDIOGRAM COMPLETE  Result Date: 07/02/2019    ECHOCARDIOGRAM REPORT   Patient Name:   Glenn Brandt Date of Exam: 07/02/2019 Medical Rec #:  [id number]      Height:       71.0 in Accession #:    [id number]     Weight:       193.1 lb Date of Birth:  [birth year]      BSA:          2.077 m Patient Age:    57 years       BP:           127/58 mmHg Patient Gender: M              HR:           34 bpm. Exam Location:  Inpatient Procedure: 2D Echo STAT ECHO Indications:    I44.2 Complete heart block  History:        Patient has no prior history of Echocardiogram examinations.                 CAD, Signs/Symptoms:Syncope; Risk Factors:Hypertension and                 Former Smoker.  Sonographer:    Mikki Santee RDCS (AE) Referring Phys: [id number] STEPHANIE FALK [place]  1. Left ventricular ejection fraction, by estimation, is 60 to 65%. The left ventricle has normal function. The left ventricle has no regional wall motion abnormalities. The left ventricular internal cavity size was mildly dilated. Left ventricular diastolic parameters are indeterminate due to complete heart block.  2. Right ventricular systolic function is normal. The right ventricular size is normal. There is normal pulmonary artery systolic pressure.  3. The mitral valve is normal in structure and function. Trivial mitral valve regurgitation. No evidence of mitral stenosis.  4. The aortic valve is normal in structure and function. Aortic valve regurgitation is not visualized. No aortic stenosis is present.  5. The inferior vena cava is normal in size with  greater than 50% respiratory variability,  suggesting right atrial pressure of 3 mmHg. FINDINGS  Left Ventricle: Left ventricular ejection fraction, by estimation, is 60 to 65%. The left ventricle has normal function. The left ventricle has no regional wall motion abnormalities. The left ventricular internal cavity size was mildly dilated. There is  no left ventricular hypertrophy. Left ventricular diastolic parameters are indeterminate. Normal left ventricular filling pressure. Right Ventricle: The right ventricular size is normal. No increase in right ventricular wall thickness. Right ventricular systolic function is normal. There is normal pulmonary artery systolic pressure. The tricuspid regurgitant velocity is 1.45 m/s, and  with an assumed right atrial pressure of 3 mmHg, the estimated right ventricular systolic pressure is [id number] mmHg. Left Atrium: Left atrial size was normal in size. Right Atrium: Right atrial size was normal in size. Pericardium: There is no evidence of pericardial effusion. Mitral Valve: The mitral valve is normal in structure and function. Normal mobility of the mitral valve leaflets. Trivial mitral valve regurgitation. No evidence of mitral valve stenosis. Tricuspid Valve: The tricuspid valve is normal in structure. Tricuspid valve regurgitation is trivial. No evidence of tricuspid stenosis. Aortic Valve: The aortic valve is normal in structure and function. Aortic valve regurgitation is not visualized. No aortic stenosis is present. Pulmonic Valve: The pulmonic valve was normal in structure. Pulmonic valve regurgitation is trivial. No evidence of pulmonic stenosis. Aorta: The aortic root is normal in size and structure. Venous: The inferior vena cava is normal in size with greater than 50% respiratory variability, suggesting right atrial pressure of 3 mmHg. IAS/Shunts: No atrial level shunt detected by color flow Doppler.  LEFT VENTRICLE PLAX 2D LVIDd:         5.90 cm   Diastology LVIDs:         4.00 cm   LV e' lateral:   9.14  cm/s LV PW:         1.00 cm   LV E/e' lateral: 13.6 LV IVS:        1.00 cm   LV e' medial:    15.00 cm/s LVOT diam:     2.70 cm   LV E/e' medial:  8.3 LV SV:         181.50 ml LV SV Index:   87.37 LVOT Area:     5.73 cm  RIGHT VENTRICLE RV S prime:     15.90 cm/s TAPSE (M-mode): 3.6 cm LEFT ATRIUM             Index       RIGHT ATRIUM           Index LA diam:        3.90 cm 1.88 cm/m  RA Area:     19.00 cm LA Vol (A2C):   63.8 ml 30.71 ml/m RA Volume:   49.90 ml  24.02 ml/m LA Vol (A4C):   43.1 ml 20.75 ml/m LA Biplane Vol: 53.2 ml 25.61 ml/m  AORTIC VALVE LVOT Vmax:   137.00 cm/s LVOT Vmean:  91.800 cm/s LVOT VTI:    0.317 m  AORTA Ao Root diam: 3.20 cm MITRAL VALVE                TRICUSPID VALVE MV Area (PHT): 3.85 cm     TR Peak grad:   8.4 mmHg MV Decel Time: 197 msec     TR Vmax:        145.00 cm/s MV E velocity: 124.00 cm/s MV A velocity: 75.80 cm/s  SHUNTS MV E/A ratio:  1.64         Systemic VTI:  0.32 m                             Systemic Diam: 2.70 cm Fransico Him MD Electronically signed by Fransico Him MD Signature Date/Time: 07/02/2019/8:38:52 AM    Final      Scheduled Meds: . aspirin  81 mg Oral Daily  . cyclobenzaprine  10 mg Oral QHS  . pantoprazole  40 mg Oral Daily  . PARoxetine  60 mg Oral QHS  . simvastatin  10 mg Oral QHS  . tamsulosin  0.8 mg Oral Daily   Continuous Infusions: . sodium chloride 75 mL/hr at 07/01/19 1739  .  ceFAZolin (ANCEF) IV    . DOPamine Stopped (07/02/19 1051)     LOS: 1 day    Time spent: 25 minutes spent in the coordination of care today.    Jonnie Finner, DO [facility]  If 7PM-7AM, please contact night-coverage [email or web address] 07/02/2019, 2:45 PM

## 2019-07-03 ENCOUNTER — Inpatient Hospital Stay (HOSPITAL_COMMUNITY): Payer: No Typology Code available for payment source

## 2019-07-03 DIAGNOSIS — I442 Atrioventricular block, complete: Principal | ICD-10-CM

## 2019-07-03 LAB — CBC WITH DIFFERENTIAL/PLATELET
Abs Immature Granulocytes: 0.01 10*3/uL (ref 0.00–0.07)
Basophils Absolute: 0 10*3/uL (ref 0.0–0.1)
Basophils Relative: 0 %
Eosinophils Absolute: 0.1 10*3/uL (ref 0.0–0.5)
Eosinophils Relative: 1 %
HCT: 38.9 % — ABNORMAL LOW (ref 39.0–52.0)
Hemoglobin: 13.1 g/dL (ref 13.0–17.0)
Immature Granulocytes: 0 %
Lymphocytes Relative: 19 %
Lymphs Abs: 1.4 10*3/uL (ref 0.7–4.0)
MCH: 29.5 pg (ref 26.0–34.0)
MCHC: 33.7 g/dL (ref 30.0–36.0)
MCV: 87.6 fL (ref 80.0–100.0)
Monocytes Absolute: 0.7 10*3/uL (ref 0.1–1.0)
Monocytes Relative: 10 %
Neutro Abs: 5 10*3/uL (ref 1.7–7.7)
Neutrophils Relative %: 70 %
Platelets: 205 10*3/uL (ref 150–400)
RBC: 4.44 MIL/uL (ref 4.22–5.81)
RDW: 13.8 % (ref 11.5–15.5)
WBC: 7.2 10*3/uL (ref 4.0–10.5)
nRBC: 0 % (ref 0.0–0.2)

## 2019-07-03 LAB — MAGNESIUM: Magnesium: 1.5 mg/dL — ABNORMAL LOW (ref 1.7–2.4)

## 2019-07-03 LAB — COMPREHENSIVE METABOLIC PANEL
ALT: 17 U/L (ref 0–44)
AST: 22 U/L (ref 15–41)
Albumin: 3.2 g/dL — ABNORMAL LOW (ref 3.5–5.0)
Alkaline Phosphatase: 30 U/L — ABNORMAL LOW (ref 38–126)
Anion gap: 9 (ref 5–15)
BUN: 8 mg/dL (ref 6–20)
CO2: 21 mmol/L — ABNORMAL LOW (ref 22–32)
Calcium: 8.6 mg/dL — ABNORMAL LOW (ref 8.9–10.3)
Chloride: 106 mmol/L (ref 98–111)
Creatinine, Ser: 1.17 mg/dL (ref 0.61–1.24)
GFR calc Af Amer: >60 mL/min (ref >60)
GFR calc non Af Amer: >60 mL/min (ref >60)
Glucose, Bld: 95 mg/dL (ref 70–99)
Potassium: 3.8 mmol/L (ref 3.5–5.1)
Sodium: 136 mmol/L (ref 135–145)
Total Bilirubin: 0.8 mg/dL (ref 0.3–1.2)
Total Protein: 5.8 g/dL — ABNORMAL LOW (ref 6.5–8.1)

## 2019-07-03 MED ORDER — LISINOPRIL 5 MG PO TABS
5.0000 mg | ORAL_TABLET | Freq: Every day | ORAL | Status: DC
  Administered 2019-07-03: 5 mg via ORAL
  Filled 2019-07-03: qty 1

## 2019-07-03 MED ORDER — LISINOPRIL 5 MG PO TABS: 5.0000 mg | ORAL_TABLET | Freq: Every day | ORAL | 6 refills | Status: DC

## 2019-07-03 MED ORDER — MAGNESIUM SULFATE 2 GM/50ML IV SOLN
2.0000 g | Freq: Once | INTRAVENOUS | Status: AC
  Administered 2019-07-03: 09:00:00 2 g via INTRAVENOUS
  Filled 2019-07-03: qty 50

## 2019-07-03 MED FILL — Lidocaine HCl Local Inj 1%: INTRAMUSCULAR | Qty: 20 | Status: AC

## 2019-07-03 NOTE — TOC Progression Note (Signed)
Transition of Care [facility]) - Progression Note    Patient Details  Name: Glenn Brandt MRN: [id number] Date of Birth: Dec 29, 1960  [facility]) CM/SW Contact  Zenon Mayo, RN Phone Number: 07/03/2019, 12:56 PM  Clinical Narrative:    NCM left message with April at [facility] that patient dc today.         Expected Discharge Plan and Services           Expected Discharge Date: 07/03/19                                     Social Determinants of Health (SDOH) Interventions    Readmission Risk Interventions No flowsheet data found.

## 2019-07-03 NOTE — Plan of Care (Signed)
  Problem: Education: Goal: Knowledge of General Education information will improve Description: Including pain rating scale, medication(s)/side effects and non-pharmacologic comfort measures Outcome: Completed/Met   Problem: Health Behavior/Discharge Planning: Goal: Ability to manage health-related needs will improve Outcome: Completed/Met   Problem: Clinical Measurements: Goal: Ability to maintain clinical measurements within normal limits will improve 07/03/2019 1159 by Don Perking, RN Outcome: Completed/Met 07/03/2019 1035 by Don Perking, RN Outcome: Progressing Goal: Will remain free from infection 07/03/2019 1159 by Don Perking, RN Outcome: Completed/Met 07/03/2019 1035 by Don Perking, RN Outcome: Progressing Goal: Diagnostic test results will improve 07/03/2019 1159 by Don Perking, RN Outcome: Completed/Met 07/03/2019 1035 by Don Perking, RN Outcome: Progressing Goal: Respiratory complications will improve Outcome: Completed/Met Goal: Cardiovascular complication will be avoided 07/03/2019 1159 by Don Perking, RN Outcome: Completed/Met 07/03/2019 1035 by Don Perking, RN Outcome: Progressing   Problem: Activity: Goal: Risk for activity intolerance will decrease Outcome: Completed/Met   Problem: Nutrition: Goal: Adequate nutrition will be maintained Outcome: Completed/Met   Problem: Coping: Goal: Level of anxiety will decrease Outcome: Completed/Met   Problem: Elimination: Goal: Will not experience complications related to bowel motility Outcome: Completed/Met Goal: Will not experience complications related to urinary retention Outcome: Completed/Met   Problem: Pain Managment: Goal: General experience of comfort will improve 07/03/2019 1159 by Don Perking, RN Outcome: Completed/Met 07/03/2019 1035 by Don Perking, RN Outcome: Progressing   Problem: Safety: Goal: Ability to remain free from injury  will improve 07/03/2019 1159 by Don Perking, RN Outcome: Completed/Met 07/03/2019 1035 by Don Perking, RN Outcome: Progressing   Problem: Skin Integrity: Goal: Risk for impaired skin integrity will decrease 07/03/2019 1159 by Don Perking, RN Outcome: Completed/Met 07/03/2019 1035 by Don Perking, RN Outcome: Progressing   Problem: Education: Goal: Knowledge of cardiac device and self-care will improve 07/03/2019 1159 by Don Perking, RN Outcome: Completed/Met 07/03/2019 1035 by Don Perking, RN Outcome: Progressing Goal: Ability to safely manage health related needs after discharge will improve 07/03/2019 1159 by Don Perking, RN Outcome: Completed/Met 07/03/2019 1035 by Don Perking, RN Outcome: Progressing Goal: Individualized Educational Video(s) 07/03/2019 1159 by Don Perking, RN Outcome: Completed/Met 07/03/2019 1035 by Don Perking, RN Outcome: Progressing   Problem: Cardiac: Goal: Ability to achieve and maintain adequate cardiopulmonary perfusion will improve Outcome: Completed/Met

## 2019-07-03 NOTE — Plan of Care (Signed)
  Problem: Clinical Measurements: Goal: Ability to maintain clinical measurements within normal limits will improve Outcome: Progressing Goal: Will remain free from infection Outcome: Progressing Goal: Diagnostic test results will improve Outcome: Progressing Goal: Cardiovascular complication will be avoided Outcome: Progressing   Problem: Pain Managment: Goal: General experience of comfort will improve Outcome: Progressing   Problem: Safety: Goal: Ability to remain free from injury will improve Outcome: Progressing   Problem: Skin Integrity: Goal: Risk for impaired skin integrity will decrease Outcome: Progressing   Problem: Education: Goal: Knowledge of cardiac device and self-care will improve Outcome: Progressing Goal: Ability to safely manage health related needs after discharge will improve Outcome: Progressing Goal: Individualized Educational Video(s) Outcome: Progressing

## 2019-07-03 NOTE — Progress Notes (Signed)
[name]  PROGRESS NOTE    Glenn Brandt  [id number] DOB: [birth year] DOA: 07/01/2019 [facility]CP: [facility]   Brief Narrative:   Claris Gower a 59 y.o.malewith medical history significant ofHTN, HLD, prostate CA, PTSD. Questionable h/o CAD: Has remote H/O LHC at [place], told everything "looked okay" but "may have had a prior heart attack". Pt presents to ED after having 4-5 syncopal episodes over the past week. 3 today. Pt says he will have onset of total-body "numbness", that works its way from feet to head, with associated lightheadedness and dizziness. Ultimately he will lose consciousness. ED Course:CT head neg. EKG reveals HR in 30s with variable AV block. EKG in system shows a 2nd degree 2:1 block with a rate in the 30s, but he is also noted to have CHB (3rd degree) on the monitor at the time cards is seeing him in the ED. Asymptomatic while laying in bed. BP 140s-150s. Kidneys look okay on labs.  07/03/19: Looks good this morning. BP is a little up. Added lisinopril. EP will discharge today. Appreciate their service.   Assessment & Plan:   Principal Problem:   CHB (complete heart block) (HCC) Active Problems:   Syncope, cardiogenic   HTN (hypertension)   Prostate cancer (HCC)  Complete Heart Block Recurrent syncope Symptomatic bradycardia     - now s/p PPM     - remain on tele monitor     - echo results noted  HTN     - no home BP meds lists     - start ACEi if necessary     - 07/03/19: add lose dose lisinopril  Hx of CAD?     - on asa, zocor     - denies CP     - Trp neg x2  Prostate CA     - Follows with [facility].  GERD     - PPI  Anxiety/depression     - paxil  DVT prophylaxis: SCDs Code Status: FULL Disposition Plan: Home today.  Consultants:   Cardiology  Procedures:   Pacer placement  ROS:  Denies N, V, ab pain, CP . Remainder 10-pt ROS is negative for all not previously mentioned.  Subjective: "I'm feeling  pretty good."  Objective: Vitals:   07/02/19 2302 07/03/19 0300 07/03/19 0753 07/03/19 0800  BP: (!) 148/91 (!) 142/88 (!) 157/90 (!) 158/88  Pulse: 72 (!) 59 69 61  Resp: 19 13 (!) 23 18  Temp: 98.3 F (36.8 C) 98.1 F (36.7 C) 98.2 F (36.8 C) 98.2 F (36.8 C)  TempSrc: Oral Oral Oral Oral  SpO2: 94% 94% 95% 95%  Weight:      Height:        Intake/Output Summary (Last 24 hours) at 07/03/2019 [id number] Last data filed at 07/03/2019 0800 Gross per 24 hour  Intake 3538.22 ml  Output 200 ml  Net 3338.22 ml   Filed Weights   07/01/19 2042  Weight: 87.6 kg    Examination:  General: 59 y.o. male resting in bed in NAD Cardiovascular: RRR, +S1, S2, no m/g/r, equal pulses throughout Respiratory: CTABL, no w/r/r, normal WOB GI: BS+, NDNT,soft MSK: No e/c/c Neuro: A&O x 3, no focal deficits Psyc: Appropriate interaction and affect, calm/cooperative   Data Reviewed: I have personally reviewed following labs and imaging studies.  CBC: Recent Labs  Lab 07/01/19 1618 07/03/19 0114  WBC 5.5 7.2  NEUTROABS  --  5.0  HGB 14.2 13.1  HCT 42.7 38.9*  MCV 88.6  87.6  PLT 241 [id number]   Basic Metabolic Panel: Recent Labs  Lab 07/01/19 1618 07/02/19 0141 07/03/19 0114  NA 140 140 136  K 4.1 4.0 3.8  CL 107 109 106  CO2 24 21* 21*  GLUCOSE 102* 97 95  BUN 7 6 8   CREATININE 1.23 1.11 1.17  CALCIUM 9.3 9.0 8.6*  MG  --   --  1.5*   GFR: Estimated Creatinine Clearance: 71.5 mL/min (by C-G formula based on SCr of 1.17 mg/dL). Liver Function Tests: Recent Labs  Lab 07/01/19 1618 07/03/19 0114  AST 23 22  ALT 21 17  ALKPHOS 34* 30*  BILITOT 0.6 0.8  PROT 6.6 5.8*  ALBUMIN 3.6 3.2*   No results for input(s): LIPASE, AMYLASE in the last 168 hours. No results for input(s): AMMONIA in the last 168 hours. Coagulation Profile: No results for input(s): INR, PROTIME in the last 168 hours. Cardiac Enzymes: No results for input(s): CKTOTAL, CKMB, CKMBINDEX, TROPONINI in the last  168 hours. BNP (last 3 results) No results for input(s): PROBNP in the last 8760 hours. HbA1C: No results for input(s): HGBA1C in the last 72 hours. CBG: Recent Labs  Lab 07/01/19 1617 07/02/19 1222  GLUCAP 96 97   Lipid Profile: No results for input(s): CHOL, HDL, LDLCALC, TRIG, CHOLHDL, LDLDIRECT in the last 72 hours. Thyroid Function Tests: Recent Labs    07/01/19 1854  TSH 0.894   Anemia Panel: No results for input(s): VITAMINB12, FOLATE, FERRITIN, TIBC, IRON, RETICCTPCT in the last 72 hours. Sepsis Labs: No results for input(s): PROCALCITON, LATICACIDVEN in the last 168 hours.  Recent Results (from the past 240 hour(s))  SARS CORONAVIRUS 2 (TAT 6-24 HRS) Nasopharyngeal Nasopharyngeal Swab     Status: None   Collection Time: 07/01/19  5:34 PM   Specimen: Nasopharyngeal Swab  Result Value Ref Range Status   SARS Coronavirus 2 NEGATIVE NEGATIVE Final    Comment: (NOTE) SARS-CoV-2 target nucleic acids are NOT DETECTED. The SARS-CoV-2 RNA is generally detectable in upper and lower respiratory specimens during the acute phase of infection. Negative results do not preclude SARS-CoV-2 infection, do not rule out co-infections with other pathogens, and should not be used as the sole basis for treatment or other patient management decisions. Negative results must be combined with clinical observations, patient history, and epidemiological information. The expected result is Negative. Fact Sheet for Patients: SugarRoll.be Fact Sheet for Healthcare Providers: https://www.woods-mathews.com/ This test is not yet approved or cleared by the [place] FDA and  has been authorized for detection and/or diagnosis of SARS-CoV-2 by FDA under an Emergency Use Authorization (EUA). This EUA will remain  in effect (meaning this test can be used) for the duration of the COVID-19 declaration under Section 56 4(b)(1) of the Act, 21 U.S.C. section  360bbb-3(b)(1), unless the authorization is terminated or revoked sooner. Performed at [facility], [place] [address], [place], [place] [address]   MRSA PCR Screening     Status: None   Collection Time: 07/01/19  9:20 PM   Specimen: Nasopharyngeal  Result Value Ref Range Status   MRSA by PCR NEGATIVE NEGATIVE Final    Comment:        The GeneXpert MRSA Assay (FDA approved for NASAL specimens only), is one component of a comprehensive MRSA colonization surveillance program. It is not intended to diagnose MRSA infection nor to guide or monitor treatment for MRSA infections. Performed at [facility], [place] [address] [place], [place] [address]  Radiology Studies: DG Chest 1 View  Result Date: 07/01/2019 CLINICAL DATA:  59 year old male with history of multiple syncopal episodes of the past 2 weeks. EXAM: CHEST  1 VIEW COMPARISON:  Chest x-ray 06/28/2018. FINDINGS: Lung volumes are normal. No consolidative airspace disease. No pleural effusions. No pneumothorax. No pulmonary nodule or mass noted. Pulmonary vasculature and the cardiomediastinal silhouette are within normal limits. IMPRESSION: No radiographic evidence of acute cardiopulmonary disease. Electronically Signed   By: Vinnie Langton M.D.   On: 07/01/2019 17:42   DG Chest 2 View  Result Date: 07/03/2019 CLINICAL DATA:  Pacemaker insertion EXAM: CHEST - 2 VIEW COMPARISON:  07/01/2019 FINDINGS: LEFT subclavian sequential transvenous pacemaker leads project at RIGHT atrium and RIGHT ventricle. Normal heart size, mediastinal contours, and pulmonary vascularity. Lungs clear. No pulmonary infiltrate, pleural effusion or pneumothorax. Osseous structures unremarkable. IMPRESSION: LEFT subclavian pacemaker as above without acute abnormalities. Electronically Signed   By: Lavonia Dana M.D.   On: 07/03/2019 08:58   CT Head Wo Contrast  Result Date: 07/01/2019 CLINICAL DATA:  59 year old male with syncope. EXAM: CT HEAD  WITHOUT CONTRAST TECHNIQUE: Contiguous axial images were obtained from the base of the skull through the vertex without intravenous contrast. COMPARISON:  None. FINDINGS: Brain: The ventricles and sulci appropriate size for patient's age. The gray-white matter discrimination is preserved. There is no acute intracranial hemorrhage. No mass effect or midline shift. No extra-axial fluid collection Vascular: No hyperdense vessel or unexpected calcification. Skull: Normal. Negative for fracture or focal lesion. Sinuses/Orbits: No acute finding. Other: None IMPRESSION: No acute intracranial pathology. Electronically Signed   By: Anner Crete M.D.   On: 07/01/2019 18:17   DG Foot Complete Left  Result Date: 07/01/2019 CLINICAL DATA:  59 year old male with fall and left foot pain. EXAM: LEFT FOOT - COMPLETE 3+ VIEW COMPARISON:  None. FINDINGS: There is no acute fracture or dislocation. No significant arthritic changes. The soft tissues are unremarkable. IMPRESSION: Negative. Electronically Signed   By: Anner Crete M.D.   On: 07/01/2019 17:43   ECHOCARDIOGRAM COMPLETE  Result Date: 07/02/2019    ECHOCARDIOGRAM REPORT   Patient Name:   Glenn Brandt Date of Exam: 07/02/2019 Medical Rec #:  [id number]      Height:       71.0 in Accession #:    [id number]     Weight:       193.1 lb Date of Birth:  August 14, 1960      BSA:          2.077 m Patient Age:    55 years       BP:           127/58 mmHg Patient Gender: M              HR:           34 bpm. Exam Location:  Inpatient Procedure: 2D Echo STAT ECHO Indications:    I44.2 Complete heart block  History:        Patient has no prior history of Echocardiogram examinations.                 CAD, Signs/Symptoms:Syncope; Risk Factors:Hypertension and                 Former Smoker.  Sonographer:    Mikki Santee RDCS (AE) Referring Phys: [id number] STEPHANIE FALK [place]  1. Left ventricular ejection fraction, by estimation, is 60 to 65%. The left ventricle has  normal function. The left ventricle  has no regional wall motion abnormalities. The left ventricular internal cavity size was mildly dilated. Left ventricular diastolic parameters are indeterminate due to complete heart block.  2. Right ventricular systolic function is normal. The right ventricular size is normal. There is normal pulmonary artery systolic pressure.  3. The mitral valve is normal in structure and function. Trivial mitral valve regurgitation. No evidence of mitral stenosis.  4. The aortic valve is normal in structure and function. Aortic valve regurgitation is not visualized. No aortic stenosis is present.  5. The inferior vena cava is normal in size with greater than 50% respiratory variability, suggesting right atrial pressure of 3 mmHg. FINDINGS  Left Ventricle: Left ventricular ejection fraction, by estimation, is 60 to 65%. The left ventricle has normal function. The left ventricle has no regional wall motion abnormalities. The left ventricular internal cavity size was mildly dilated. There is  no left ventricular hypertrophy. Left ventricular diastolic parameters are indeterminate. Normal left ventricular filling pressure. Right Ventricle: The right ventricular size is normal. No increase in right ventricular wall thickness. Right ventricular systolic function is normal. There is normal pulmonary artery systolic pressure. The tricuspid regurgitant velocity is 1.45 m/s, and  with an assumed right atrial pressure of 3 mmHg, the estimated right ventricular systolic pressure is [id number] mmHg. Left Atrium: Left atrial size was normal in size. Right Atrium: Right atrial size was normal in size. Pericardium: There is no evidence of pericardial effusion. Mitral Valve: The mitral valve is normal in structure and function. Normal mobility of the mitral valve leaflets. Trivial mitral valve regurgitation. No evidence of mitral valve stenosis. Tricuspid Valve: The tricuspid valve is normal in structure. Tricuspid  valve regurgitation is trivial. No evidence of tricuspid stenosis. Aortic Valve: The aortic valve is normal in structure and function. Aortic valve regurgitation is not visualized. No aortic stenosis is present. Pulmonic Valve: The pulmonic valve was normal in structure. Pulmonic valve regurgitation is trivial. No evidence of pulmonic stenosis. Aorta: The aortic root is normal in size and structure. Venous: The inferior vena cava is normal in size with greater than 50% respiratory variability, suggesting right atrial pressure of 3 mmHg. IAS/Shunts: No atrial level shunt detected by color flow Doppler.  LEFT VENTRICLE PLAX 2D LVIDd:         5.90 cm   Diastology LVIDs:         4.00 cm   LV e' lateral:   9.14 cm/s LV PW:         1.00 cm   LV E/e' lateral: 13.6 LV IVS:        1.00 cm   LV e' medial:    15.00 cm/s LVOT diam:     2.70 cm   LV E/e' medial:  8.3 LV SV:         181.50 ml LV SV Index:   87.37 LVOT Area:     5.73 cm  RIGHT VENTRICLE RV S prime:     15.90 cm/s TAPSE (M-mode): 3.6 cm LEFT ATRIUM             Index       RIGHT ATRIUM           Index LA diam:        3.90 cm 1.88 cm/m  RA Area:     19.00 cm LA Vol (A2C):   63.8 ml 30.71 ml/m RA Volume:   49.90 ml  24.02 ml/m LA Vol (A4C):   43.1 ml 20.75 ml/m LA Biplane  Vol: 53.2 ml 25.61 ml/m  AORTIC VALVE LVOT Vmax:   137.00 cm/s LVOT Vmean:  91.800 cm/s LVOT VTI:    0.317 m  AORTA Ao Root diam: 3.20 cm MITRAL VALVE                TRICUSPID VALVE MV Area (PHT): 3.85 cm     TR Peak grad:   8.4 mmHg MV Decel Time: 197 msec     TR Vmax:        145.00 cm/s MV E velocity: 124.00 cm/s MV A velocity: 75.80 cm/s   SHUNTS MV E/A ratio:  1.64         Systemic VTI:  0.32 m                             Systemic Diam: 2.70 cm Fransico Him MD Electronically signed by Fransico Him MD Signature Date/Time: 07/02/2019/8:38:52 AM    Final      Scheduled Meds: . aspirin  81 mg Oral Daily  . cyclobenzaprine  10 mg Oral QHS  . lisinopril  5 mg Oral Daily  . pantoprazole   40 mg Oral Daily  . PARoxetine  60 mg Oral QHS  . simvastatin  10 mg Oral QHS  . tamsulosin  0.8 mg Oral Daily   Continuous Infusions: . sodium chloride 75 mL/hr at 07/02/19 1954  . DOPamine Stopped (07/02/19 1051)  . magnesium sulfate bolus IVPB 2 g (07/03/19 0908)     LOS: 2 days    Time spent: 25 minutes spent in the coordination of care today.     Jonnie Finner, DO [facility]  If 7PM-7AM, please contact night-coverage [email or web address] 07/03/2019, 9:56 AM

## 2019-07-03 NOTE — Discharge Summary (Addendum)
ELECTROPHYSIOLOGY PROCEDURE DISCHARGE SUMMARY    Patient ID: Glenn Brandt,  MRN: [id number], DOB/AGE: June 18, 1960 59 y.o.  Admit date: 07/01/2019 Discharge date: 07/03/2019  Primary Care Physician: [place]  Primary Cardiologist: No primary care provider on file.  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  Implantation of a Medtronic dual chamber PPM on 07/02/2019 by Dr. Caryl Comes.  The patient received a Medtronic model number [id number] PPM with a Medtronic MRI compatible 5076 ventricular lead serial number [id number] and a  Medtronic MRI compatible 5076 atrial lead serial number [id number]. There were no immediate post procedure complications. 2.  CXR on 07/03/2019 demonstrated no pneumothorax status post device implantation.   Brief HPI: Glenn Brandt is a 59 y.o. male was admitted for syncope and electrophysiology team asked to see for consideration of PPM implantation in the setting of CHB.  Past medical history includes prostate cancer and unclear burden of CAD (reportedly had a previous cath that was "all good". EF normal.  The patient has had symptomatic bradycardia without reversible causes identified.  Risks, benefits, and alternatives to PPM implantation were reviewed with the patient who wished to proceed.   Hospital Course:  The patient was admitted and underwent implantation of a Medtronic dual chamber PPM with details as outlined above.  He was monitored on telemetry overnight which demonstrated stable pacing.  Left chest was without hematoma or ecchymosis.  The device was interrogated and found to be functioning normally.  CXR was obtained and demonstrated 07/03/19 no pneumothorax status post device implantation. Wound care, arm mobility, and  restrictions were reviewed with the patient.  The patient was examined and considered stable for discharge to home.    Physical Exam: Vitals:   07/02/19 2302 07/03/19 0300 07/03/19 0753 07/03/19 0800  BP: (!) 148/91 (!) 142/88 (!) 157/90 (!) 158/88  Pulse: 72 (!) 59 69 61  Resp: 19 13 (!) 23 18  Temp: 98.3 F (36.8 C) 98.1 F (36.7 C) 98.2 F (36.8 C) 98.2 F (36.8 C)  TempSrc: Oral Oral Oral Oral  SpO2: 94% 94% 95% 95%  Weight:      Height:        GEN- The patient is well appearing, alert and oriented x 3 today.   HEENT: normocephalic, atraumatic; sclera clear, conjunctiva pink; hearing intact; oropharynx clear; neck supple, no JVP Lymph- no cervical lymphadenopathy Lungs- Clear to ausculation bilaterally, normal work of breathing.  No wheezes, rales, rhonchi Heart- Regular rate and rhythm, no murmurs, rubs or gallops, PMI not laterally displaced GI- soft, non-tender, non-distended, bowel sounds present, no hepatosplenomegaly Extremities- no clubbing, cyanosis, or edema; DP/PT/radial pulses 2+ bilaterally MS- no significant deformity or atrophy Skin- warm and dry, no rash or lesion, left chest without hematoma/ecchymosis Psych- euthymic mood, full affect Neuro- strength and sensation are intact   Labs:   Lab Results  Component Value Date   WBC 7.2 07/03/2019   HGB 13.1 07/03/2019   HCT 38.9 (L) 07/03/2019   MCV 87.6 07/03/2019   PLT 205 07/03/2019    Recent Labs  Lab 07/03/19 0114  NA 136  K 3.8  CL 106  CO2 21*  BUN 8  CREATININE 1.17  CALCIUM 8.6*  PROT 5.8*  BILITOT 0.8  ALKPHOS 30*  ALT 17  AST 22  GLUCOSE  95    Discharge Medications:  Allergies as of 07/03/2019   No Known Allergies     Medication List    TAKE these medications   acetaminophen 500 MG tablet Commonly known as: TYLENOL Take 500-1,000 mg by mouth every 6 (six) hours as needed for mild pain or headache.   albuterol 108 (90 Base) MCG/ACT inhaler Commonly known as: VENTOLIN  HFA Inhale 2 puffs into the lungs 4 (four) times daily as needed for wheezing or shortness of breath.   aspirin 81 MG chewable tablet Chew 81 mg by mouth daily.   cetirizine 10 MG tablet Commonly known as: ZYRTEC Take 10 mg by mouth daily as needed (for seasonal allergies).   cyclobenzaprine 10 MG tablet Commonly known as: FLEXERIL Take 10 mg by mouth at bedtime.   diclofenac sodium 1 % Gel Commonly known as: VOLTAREN Apply 4 g topically See admin instructions. Apply 4 grams to right knee, right hip, and/or lower back up to four times daily as needed for pain   HYDROcodone-acetaminophen 5-325 MG tablet Commonly known as: NORCO/VICODIN Take 1 tablet by mouth 3 (three) times daily as needed (for pain).   lisinopril 5 MG tablet Commonly known as: ZESTRIL Take 1 tablet (5 mg total) by mouth daily.   omeprazole 20 MG capsule Commonly known as: PRILOSEC Take 20 mg by mouth daily before breakfast.   PARoxetine 40 MG tablet Commonly known as: PAXIL Take 60 mg by mouth at bedtime.   simvastatin 20 MG tablet Commonly known as: ZOCOR Take 10 mg by mouth at bedtime.   tamsulosin 0.4 MG Caps capsule Commonly known as: FLOMAX Take 0.8 mg by mouth every morning.       Disposition:   Follow-up Information    Deboraha Sprang, MD Follow up on 10/05/2019.   Specialty: Cardiology Why: at 2 pm for 3 month post pacemaker check Contact information: [address] [place] [address] [phone]        [facility] Follow up on 07/12/2019.   Why: at 1200 for pacemaker wound check Contact information: [place] [phone]          Duration of Discharge Encounter: Greater than 30 minutes including physician time.  Signed, Shirley Friar, PA-C  07/03/2019 10:33 AM  I have seen, examined the patient, and reviewed the above assessment and plan.   Changes to above are made where necessary.  Device interrogation reviewed and normal.  CXR reveals stable leads, no ptx..  Doing well s/p PPM.  DC to home with routine wound care and follow-up.  Co Sign: Thompson Grayer, MD 07/03/2019 3:38 PM

## 2019-07-03 NOTE — Progress Notes (Signed)
Discussed and explained discharge instructions to patient and wife. Pt going home via w/c. Follow up appts scheduled and prescriptions sent electronically to pharmacy. No complaints at this time.

## 2019-07-03 NOTE — Discharge Instructions (Signed)
Near-Syncope Near-syncope is when you suddenly get weak or dizzy, or you feel like you might pass out (faint). This may also be called presyncope. This is due to a lack of blood flow to the brain. During an episode of near-syncope, you may:  Feel dizzy, weak, or light-headed.  Feel sick to your stomach (nauseous).  See all white or all black.  See spots.  Have cold, clammy skin. This condition is caused by a sudden decrease in blood flow to the brain. This decrease can result from various causes, but most of those causes are not dangerous. However, near-syncope may be a sign of a serious medical problem, so it is important to seek medical care. Follow these instructions at home: Medicines  Take over-the-counter and prescription medicines only as told by your doctor.  If you are taking blood pressure or heart medicine, get up slowly and spend many minutes getting ready to sit and then stand. This can help with dizziness. General instructions  Be aware of any changes in your symptoms.  Talk with your doctor about your symptoms. You may need to have testing to find the cause of your near-syncope.  If you start to feel like you might pass out, lie down right away. Raise (elevate) your feet above the level of your heart. Breathe deeply and steadily. Wait until all of the symptoms are gone.  Have someone stay with you until you feel stable.  Do not drive, use machinery, or play sports until your doctor says it is okay.  Drink enough fluid to keep your pee (urine) pale yellow.  Keep all follow-up visits as told by your doctor. This is important. Get help right away if you:  Have a seizure.  Have pain in your: ? Chest. ? Belly (abdomen). ? Back.  Faint once or more than once.  Have a very bad headache.  Are bleeding from your mouth or butt.  Have black or tarry poop (stool).  Have a very fast or uneven heartbeat (palpitations).  Are mixed up (confused).  Have  trouble walking.  Are very weak.  Have trouble seeing. These symptoms may be an emergency. Do not wait to see if the symptoms will go away. Get medical help right away. Call your local emergency services (911 in the [place] Do not drive yourself to the hospital. Summary  Near-syncope is when you suddenly get weak or dizzy, or you feel like you might pass out (faint).  This condition is caused by a lack of blood flow to the brain.  Near-syncope may be a sign of a serious medical problem, so it is important to seek medical care. This information is not intended to replace advice given to you by your health care provider. Make sure you discuss any questions you have with your health care provider. Document Revised: 08/18/2018 Document Reviewed: 03/15/2018 Elsevier Patient Education  2020 [employer]. After Your Pacemaker  . You have a Medtronic Pacemaker  . Do not lift your arm above shoulder height for 1 week after your procedure. After 7 days, you may progress as below.     Monday July 09, 2019  Tuesday July 10, 2019 Wednesday July 11, 2019 Thursday July 12, 2019   . Do not lift, push, pull, or carry anything over 10 pounds with the affected arm until 6 weeks (Tuesday August 14, 2019) after your procedure.   . Do not drive until your wound check, or until instructed by your healthcare provider that you are  safe to do so.   . Monitor your pacemaker site for redness, swelling, and drainage. Call the device clinic at [phone] if you experience these symptoms or fever/chills.  . If your incision is closed with Dermabond/Surgical glue. You may shower 1 day after your pacemaker implant and wash around the site with soap and water. Avoid lotions, ointments, or perfumes over your incision until it is well-healed.  . You may use a hot tub or a pool AFTER your wound check appointment if the incision is completely closed.  . Your Pacemaker may be MRI compatible. We will discuss this at your  first follow up/wound check. .   . Remote monitoring is used to monitor your pacemaker from home. This monitoring is scheduled every 91 days by our office. It allows [place] to keep an eye on the functioning of your device to ensure it is working properly. You will routinely see your Electrophysiologist annually (more often if necessary).    Pacemaker Implantation, Care After This sheet gives you information about how to care for yourself after your procedure. Your health care provider may also give you more specific instructions. If you have problems or questions, contact your health care provider. What can I expect after the procedure? After the procedure, it is common to have:  Mild pain.  Slight bruising.  Some swelling over the incision.  A slight bump over the skin where the device was placed. Sometimes, it is possible to feel the device under the skin. This is normal.  You should received your Pacemaker ID card within 4-8 weeks. Follow these instructions at home: Medicines  Take over-the-counter and prescription medicines only as told by your health care provider.  If you were prescribed an antibiotic medicine, take it as told by your health care provider. Do not stop taking the antibiotic even if you start to feel better. Wound care     Do not remove the bandage on your chest until directed to do so by your health care provider.  After your bandage is removed, you may see pieces of tape called skin adhesive strips over the area where the cut was made (incision site). Let them fall off on their own.  Check the incision site every day to make sure it is not infected, bleeding, or starting to pull apart.  Do not use lotions or ointments near the incision site unless directed to do so.  Keep the incision area clean and dry for 7 days after the procedure or as directed by your health care provider. It takes several weeks for the incision site to completely heal.  Do not take  baths, swim, or use a hot tub for 7-10 days or as otherwise directed by your health care provider. Activity  Do not drive or use heavy machinery while taking prescription pain medicine.  Do not drive for 24 hours if you were given a medicine to help you relax (sedative).  Check with your health care provider before you start to drive or play sports.  Avoid sudden jerking, pulling, or chopping movements that pull your upper arm far away from your body. Avoid these movements for at least 6 weeks or as long as told by your health care provider.  Do not lift your upper arm above your shoulders for at least 6 weeks or as long as told by your health care provider. This means no tennis, golf, or swimming.  You may go back to work when your health care provider says it  is okay. Pacemaker care  You may be shown how to transfer data from your pacemaker through the phone to your health care provider.  Always let all health care providers know about your pacemaker before you have any medical procedures or tests.  Wear a medical ID bracelet or necklace stating that you have a pacemaker. Carry a pacemaker ID card with you at all times.  Your pacemaker battery will last for 5-15 years. Routine checks by your health care provider will let the health care provider know when the battery is starting to run down. The pacemaker will need to be replaced when the battery starts to run down.  Do not use amateur Chief of Staff. Other electrical devices are safe to use, including power tools, lawn mowers, and speakers. If you are unsure of whether something is safe to use, ask your health care provider.  When using your cell phone, hold it to the ear opposite the pacemaker. Do not leave your cell phone in a pocket over the pacemaker.  Avoid places or objects that have a strong electric or magnetic field, including: ? Airport Herbalist. When at the airport, let officials know that  you have a pacemaker. ? Power plants. ? Large electrical generators. ? Radiofrequency transmission towers, such as cell phone and radio towers. General instructions  Weigh yourself every day. If you suddenly gain weight, fluid may be building up in your body.  Keep all follow-up visits as told by your health care provider. This is important. Contact a health care provider if:  You gain weight suddenly.  Your legs or feet swell.  It feels like your heart is fluttering or skipping beats (heart palpitations).  You have chills or a fever.  You have more redness, swelling, or pain around your incisions.  You have more fluid or blood coming from your incisions.  Your incisions feel warm to the touch.  You have pus or a bad smell coming from your incisions. Get help right away if:  You have chest pain.  You have trouble breathing or are short of breath.  You become extremely tired.  You are light-headed or you faint. This information is not intended to replace advice given to you by your health care provider. Make sure you discuss any questions you have with your health care provider.

## 2019-07-03 NOTE — Progress Notes (Signed)
Patient returned from X-ray. Made comfortable in bed. Call bell in reach. Patient without complaints at the moment.

## 2019-07-03 NOTE — Progress Notes (Signed)
Patient off unit to Xray.

## 2019-07-12 ENCOUNTER — Ambulatory Visit (INDEPENDENT_AMBULATORY_CARE_PROVIDER_SITE_OTHER): Payer: Self-pay | Admitting: Student

## 2019-07-12 ENCOUNTER — Other Ambulatory Visit: Payer: Self-pay

## 2019-07-12 DIAGNOSIS — I442 Atrioventricular block, complete: Secondary | ICD-10-CM

## 2019-07-12 LAB — CUP PACEART INCLINIC DEVICE CHECK
Battery Remaining Longevity: 131 mo
Battery Voltage: 3.2 V
Brady Statistic AP VP Percent: 4.88 %
Brady Statistic AP VS Percent: 0 %
Brady Statistic AS VP Percent: 95.11 %
Brady Statistic AS VS Percent: 0.01 %
Brady Statistic RA Percent Paced: 4.86 %
Brady Statistic RV Percent Paced: 99.99 %
Date Time Interrogation Session: 20210304115649
Implantable Lead Implant Date: 20210222
Implantable Lead Implant Date: 20210222
Implantable Lead Location: 753859
Implantable Lead Location: 753860
Implantable Lead Model: 5076
Implantable Lead Model: 5076
Implantable Pulse Generator Implant Date: 20210222
Lead Channel Impedance Value: 323 Ohm
Lead Channel Impedance Value: 342 Ohm
Lead Channel Impedance Value: 399 Ohm
Lead Channel Impedance Value: 418 Ohm
Lead Channel Pacing Threshold Amplitude: 0.5 V
Lead Channel Pacing Threshold Amplitude: 0.75 V
Lead Channel Pacing Threshold Pulse Width: 0.4 ms
Lead Channel Pacing Threshold Pulse Width: 0.4 ms
Lead Channel Sensing Intrinsic Amplitude: 2.125 mV
Lead Channel Sensing Intrinsic Amplitude: 3.375 mV
Lead Channel Setting Pacing Amplitude: 3.5 V
Lead Channel Setting Pacing Amplitude: 3.5 V
Lead Channel Setting Pacing Pulse Width: 0.4 ms
Lead Channel Setting Sensing Sensitivity: 4 mV

## 2019-07-12 NOTE — Progress Notes (Signed)
Wound check appointment. Steri-strips removed. Wound without redness or edema. Incision edges approximated, wound well healed. Normal device function. Thresholds, sensing, and impedances consistent with implant measurements. Device programmed at 3.5V/auto capture programmed on for extra safety margin until 3 month visit. Histogram distribution appropriate for patient and level of activity. No mode switches or high ventricular rates noted. Patient educated about wound care, arm mobility, lifting restrictions. ROV in 3 months with Dr. Caryl Comes. Pt carelink app set up personally in office.

## 2019-10-01 ENCOUNTER — Ambulatory Visit (INDEPENDENT_AMBULATORY_CARE_PROVIDER_SITE_OTHER): Payer: No Typology Code available for payment source | Admitting: *Deleted

## 2019-10-01 DIAGNOSIS — I442 Atrioventricular block, complete: Secondary | ICD-10-CM | POA: Diagnosis not present

## 2019-10-01 LAB — CUP PACEART REMOTE DEVICE CHECK
Battery Remaining Longevity: 101 mo
Battery Voltage: 3.13 V
Brady Statistic AP VP Percent: 1.67 %
Brady Statistic AP VS Percent: 0 %
Brady Statistic AS VP Percent: 98.3 %
Brady Statistic AS VS Percent: 0.03 %
Brady Statistic RA Percent Paced: 1.67 %
Brady Statistic RV Percent Paced: 99.97 %
Date Time Interrogation Session: 20210524061728
Implantable Lead Implant Date: 20210222
Implantable Lead Implant Date: 20210222
Implantable Lead Location: 753859
Implantable Lead Location: 753860
Implantable Lead Model: 5076
Implantable Lead Model: 5076
Implantable Pulse Generator Implant Date: 20210222
Lead Channel Impedance Value: 323 Ohm
Lead Channel Impedance Value: 323 Ohm
Lead Channel Impedance Value: 399 Ohm
Lead Channel Impedance Value: 437 Ohm
Lead Channel Pacing Threshold Amplitude: 0.5 V
Lead Channel Pacing Threshold Amplitude: 0.875 V
Lead Channel Pacing Threshold Pulse Width: 0.4 ms
Lead Channel Pacing Threshold Pulse Width: 0.4 ms
Lead Channel Sensing Intrinsic Amplitude: 1.5 mV
Lead Channel Sensing Intrinsic Amplitude: 1.5 mV
Lead Channel Setting Pacing Amplitude: 3.25 V
Lead Channel Setting Pacing Amplitude: 3.25 V
Lead Channel Setting Pacing Pulse Width: 0.4 ms
Lead Channel Setting Sensing Sensitivity: 4 mV

## 2019-10-02 NOTE — Progress Notes (Signed)
Remote pacemaker transmission.   

## 2019-10-05 ENCOUNTER — Encounter: Payer: Self-pay | Admitting: Internal Medicine

## 2019-10-29 ENCOUNTER — Encounter (HOSPITAL_COMMUNITY): Payer: Self-pay

## 2019-10-29 ENCOUNTER — Inpatient Hospital Stay (HOSPITAL_COMMUNITY)
Admit: 2019-10-29 | Discharge: 2019-10-30 | DRG: 189 | Disposition: A | Discharge status: Home / Self Care | Payer: Non-veteran care | Attending: Internal Medicine | Admitting: Internal Medicine

## 2019-10-29 ENCOUNTER — Other Ambulatory Visit: Payer: Self-pay

## 2019-10-29 ENCOUNTER — Emergency Department (HOSPITAL_COMMUNITY): Payer: Non-veteran care

## 2019-10-29 DIAGNOSIS — Z20822 Contact with and (suspected) exposure to covid-19: Secondary | ICD-10-CM | POA: Diagnosis present

## 2019-10-29 DIAGNOSIS — Z8546 Personal history of malignant neoplasm of prostate: Secondary | ICD-10-CM

## 2019-10-29 DIAGNOSIS — F17210 Nicotine dependence, cigarettes, uncomplicated: Secondary | ICD-10-CM | POA: Diagnosis present

## 2019-10-29 DIAGNOSIS — J441 Chronic obstructive pulmonary disease with (acute) exacerbation: Secondary | ICD-10-CM | POA: Diagnosis present

## 2019-10-29 DIAGNOSIS — J9691 Respiratory failure, unspecified with hypoxia: Secondary | ICD-10-CM | POA: Diagnosis not present

## 2019-10-29 DIAGNOSIS — F4310 Post-traumatic stress disorder, unspecified: Secondary | ICD-10-CM | POA: Diagnosis present

## 2019-10-29 DIAGNOSIS — K219 Gastro-esophageal reflux disease without esophagitis: Secondary | ICD-10-CM | POA: Diagnosis present

## 2019-10-29 DIAGNOSIS — Z79899 Other long term (current) drug therapy: Secondary | ICD-10-CM

## 2019-10-29 DIAGNOSIS — I10 Essential (primary) hypertension: Secondary | ICD-10-CM | POA: Diagnosis present

## 2019-10-29 DIAGNOSIS — R059 Cough, unspecified: Secondary | ICD-10-CM

## 2019-10-29 DIAGNOSIS — G8929 Other chronic pain: Secondary | ICD-10-CM | POA: Diagnosis present

## 2019-10-29 DIAGNOSIS — Z950 Presence of cardiac pacemaker: Secondary | ICD-10-CM

## 2019-10-29 DIAGNOSIS — R05 Cough: Secondary | ICD-10-CM | POA: Diagnosis present

## 2019-10-29 DIAGNOSIS — I2510 Atherosclerotic heart disease of native coronary artery without angina pectoris: Secondary | ICD-10-CM | POA: Diagnosis present

## 2019-10-29 DIAGNOSIS — I442 Atrioventricular block, complete: Secondary | ICD-10-CM | POA: Diagnosis present

## 2019-10-29 DIAGNOSIS — R0603 Acute respiratory distress: Secondary | ICD-10-CM

## 2019-10-29 DIAGNOSIS — E785 Hyperlipidemia, unspecified: Secondary | ICD-10-CM | POA: Diagnosis present

## 2019-10-29 DIAGNOSIS — J9621 Acute and chronic respiratory failure with hypoxia: Principal | ICD-10-CM | POA: Diagnosis present

## 2019-10-29 DIAGNOSIS — Z7982 Long term (current) use of aspirin: Secondary | ICD-10-CM | POA: Diagnosis not present

## 2019-10-29 LAB — BASIC METABOLIC PANEL
Anion gap: 10 (ref 5–15)
BUN: 10 mg/dL (ref 8–23)
CO2: 21 mmol/L — ABNORMAL LOW (ref 22–32)
Calcium: 9.3 mg/dL (ref 8.9–10.3)
Chloride: 106 mmol/L (ref 98–111)
Creatinine, Ser: 1.09 mg/dL (ref 0.61–1.24)
GFR calc Af Amer: >60 mL/min (ref >60)
GFR calc non Af Amer: >60 mL/min (ref >60)
Glucose, Bld: 125 mg/dL — ABNORMAL HIGH (ref 70–99)
Potassium: 4.2 mmol/L (ref 3.5–5.1)
Sodium: 137 mmol/L (ref 135–145)

## 2019-10-29 LAB — TROPONIN I (HIGH SENSITIVITY)
Troponin I (High Sensitivity): 8 ng/L (ref <18)
Troponin I (High Sensitivity): 10 ng/L (ref <18)

## 2019-10-29 LAB — I-STAT ARTERIAL BLOOD GAS, ED
Acid-base deficit: 1 mmol/L (ref 0.0–2.0)
Bicarbonate: 23.3 mmol/L (ref 20.0–28.0)
Calcium, Ion: 1.21 mmol/L (ref 1.15–1.40)
HCT: 45 % (ref 39.0–52.0)
Hemoglobin: 15.3 g/dL (ref 13.0–17.0)
O2 Saturation: 100 %
Patient temperature: 97.4
Potassium: 4.1 mmol/L (ref 3.5–5.1)
Sodium: 138 mmol/L (ref 135–145)
TCO2: 24 mmol/L (ref 22–32)
pCO2 arterial: 36.3 mmHg (ref 32.0–48.0)
pH, Arterial: 7.413 (ref 7.350–7.450)
pO2, Arterial: 205 mmHg — ABNORMAL HIGH (ref 83.0–108.0)

## 2019-10-29 LAB — CBC
HCT: 47.2 % (ref 39.0–52.0)
Hemoglobin: 15.2 g/dL (ref 13.0–17.0)
MCH: 28.4 pg (ref 26.0–34.0)
MCHC: 32.2 g/dL (ref 30.0–36.0)
MCV: 88.1 fL (ref 80.0–100.0)
Platelets: 252 10*3/uL (ref 150–400)
RBC: 5.36 MIL/uL (ref 4.22–5.81)
RDW: 13.8 % (ref 11.5–15.5)
WBC: 10.7 10*3/uL — ABNORMAL HIGH (ref 4.0–10.5)
nRBC: 0 % (ref 0.0–0.2)

## 2019-10-29 LAB — BRAIN NATRIURETIC PEPTIDE: B Natriuretic Peptide: 192.3 pg/mL — ABNORMAL HIGH (ref 0.0–100.0)

## 2019-10-29 LAB — SARS CORONAVIRUS 2 BY RT PCR (HOSPITAL ORDER, PERFORMED IN ~~LOC~~ HOSPITAL LAB): SARS Coronavirus 2: NEGATIVE

## 2019-10-29 MED ORDER — DICLOFENAC SODIUM 1 % TD GEL
4.0000 g | Freq: Four times a day (QID) | TRANSDERMAL | Status: DC | PRN
  Filled 2019-10-29: qty 100

## 2019-10-29 MED ORDER — ORAL CARE MOUTH RINSE
15.0000 mL | Freq: Two times a day (BID) | OROMUCOSAL | Status: DC
  Administered 2019-10-30: 15 mL via OROMUCOSAL

## 2019-10-29 MED ORDER — ALBUTEROL SULFATE HFA 108 (90 BASE) MCG/ACT IN AERS
8.0000 | INHALATION_SPRAY | Freq: Once | RESPIRATORY_TRACT | Status: DC
  Filled 2019-10-29 (×2): qty 6.7

## 2019-10-29 MED ORDER — ACETAMINOPHEN 500 MG PO TABS
1000.0000 mg | ORAL_TABLET | Freq: Once | ORAL | Status: AC
  Administered 2019-10-29: 1000 mg via ORAL
  Filled 2019-10-29: qty 2

## 2019-10-29 MED ORDER — FUROSEMIDE 10 MG/ML IJ SOLN
40.0000 mg | Freq: Once | INTRAMUSCULAR | Status: AC
  Administered 2019-10-29: 40 mg via INTRAVENOUS
  Filled 2019-10-29: qty 4

## 2019-10-29 MED ORDER — PREDNISONE 20 MG PO TABS
40.0000 mg | ORAL_TABLET | Freq: Every day | ORAL | Status: DC
  Administered 2019-10-30: 40 mg via ORAL
  Filled 2019-10-29 (×2): qty 2

## 2019-10-29 MED ORDER — METHYLPREDNISOLONE SODIUM SUCC 125 MG IJ SOLR
125.0000 mg | Freq: Once | INTRAMUSCULAR | Status: AC
  Administered 2019-10-29: 125 mg via INTRAVENOUS
  Filled 2019-10-29: qty 2

## 2019-10-29 MED ORDER — HYDROCODONE-ACETAMINOPHEN 5-325 MG PO TABS
1.0000 | ORAL_TABLET | Freq: Three times a day (TID) | ORAL | Status: DC | PRN
  Administered 2019-10-29 (×2): 1 via ORAL
  Filled 2019-10-29 (×2): qty 1

## 2019-10-29 MED ORDER — NITROGLYCERIN 0.4 MG SL SUBL
0.4000 mg | SUBLINGUAL_TABLET | SUBLINGUAL | Status: DC | PRN
  Filled 2019-10-29: qty 1

## 2019-10-29 MED ORDER — SODIUM CHLORIDE 0.9% FLUSH
3.0000 mL | Freq: Once | INTRAVENOUS | Status: AC
  Administered 2019-10-29: 3 mL via INTRAVENOUS

## 2019-10-29 MED ORDER — IPRATROPIUM-ALBUTEROL 0.5-2.5 (3) MG/3ML IN SOLN: 3.0000 mL | Freq: Four times a day (QID) | RESPIRATORY_TRACT | Status: DC | PRN

## 2019-10-29 MED ORDER — SODIUM CHLORIDE 0.9% FLUSH
3.0000 mL | Freq: Two times a day (BID) | INTRAVENOUS | Status: DC
  Administered 2019-10-29 – 2019-10-30 (×3): 3 mL via INTRAVENOUS

## 2019-10-29 MED ORDER — SIMVASTATIN 20 MG PO TABS
10.0000 mg | ORAL_TABLET | Freq: Every day | ORAL | Status: DC
  Administered 2019-10-29: 10 mg via ORAL
  Filled 2019-10-29: qty 1

## 2019-10-29 MED ORDER — PAROXETINE HCL 20 MG PO TABS
60.0000 mg | ORAL_TABLET | Freq: Every day | ORAL | Status: DC
  Administered 2019-10-29: 60 mg via ORAL
  Filled 2019-10-29: qty 2
  Filled 2019-10-29: qty 3

## 2019-10-29 MED ORDER — MAGNESIUM SULFATE 2 GM/50ML IV SOLN
2.0000 g | Freq: Once | INTRAVENOUS | Status: AC
  Administered 2019-10-29: 2 g via INTRAVENOUS
  Filled 2019-10-29: qty 50

## 2019-10-29 MED ORDER — BENZONATATE 100 MG PO CAPS
100.0000 mg | ORAL_CAPSULE | Freq: Three times a day (TID) | ORAL | Status: DC | PRN
  Administered 2019-10-29 – 2019-10-30 (×3): 100 mg via ORAL
  Filled 2019-10-29 (×3): qty 1

## 2019-10-29 MED ORDER — ASPIRIN 81 MG PO CHEW
324.0000 mg | CHEWABLE_TABLET | Freq: Once | ORAL | Status: AC
  Administered 2019-10-29: 324 mg via ORAL
  Filled 2019-10-29: qty 4

## 2019-10-29 MED ORDER — GUAIFENESIN 100 MG/5ML PO SOLN
5.0000 mL | ORAL | Status: DC | PRN
  Administered 2019-10-29 – 2019-10-30 (×2): 100 mg via ORAL
  Filled 2019-10-29 (×3): qty 5

## 2019-10-29 MED ORDER — ENOXAPARIN SODIUM 40 MG/0.4ML ~~LOC~~ SOLN
40.0000 mg | Freq: Every day | SUBCUTANEOUS | Status: DC
  Administered 2019-10-29 – 2019-10-30 (×2): 40 mg via SUBCUTANEOUS
  Filled 2019-10-29 (×2): qty 0.4

## 2019-10-29 MED ORDER — ASPIRIN 81 MG PO CHEW
81.0000 mg | CHEWABLE_TABLET | Freq: Every day | ORAL | Status: DC
  Administered 2019-10-29 – 2019-10-30 (×2): 81 mg via ORAL
  Filled 2019-10-29 (×2): qty 1

## 2019-10-29 MED ORDER — IPRATROPIUM-ALBUTEROL 0.5-2.5 (3) MG/3ML IN SOLN
3.0000 mL | Freq: Four times a day (QID) | RESPIRATORY_TRACT | Status: DC
  Administered 2019-10-29 (×3): 3 mL via RESPIRATORY_TRACT
  Filled 2019-10-29 (×3): qty 3

## 2019-10-29 MED ORDER — LEVALBUTEROL HCL 0.63 MG/3ML IN NEBU
INHALATION_SOLUTION | RESPIRATORY_TRACT | Status: AC
  Administered 2019-10-29: 0.63 mg
  Filled 2019-10-29: qty 3

## 2019-10-29 MED ORDER — TAMSULOSIN HCL 0.4 MG PO CAPS
0.8000 mg | ORAL_CAPSULE | Freq: Every day | ORAL | Status: DC
  Administered 2019-10-29 – 2019-10-30 (×2): 0.8 mg via ORAL
  Filled 2019-10-29 (×2): qty 2

## 2019-10-29 MED ORDER — ALBUTEROL SULFATE (2.5 MG/3ML) 0.083% IN NEBU
2.5000 mg | INHALATION_SOLUTION | RESPIRATORY_TRACT | Status: DC | PRN
  Administered 2019-10-30: 2.5 mg via RESPIRATORY_TRACT
  Filled 2019-10-29: qty 3

## 2019-10-29 MED ORDER — PANTOPRAZOLE SODIUM 40 MG PO TBEC
40.0000 mg | DELAYED_RELEASE_TABLET | Freq: Every day | ORAL | Status: DC
  Administered 2019-10-29 – 2019-10-30 (×2): 40 mg via ORAL
  Filled 2019-10-29 (×2): qty 1

## 2019-10-29 NOTE — H&P (Addendum)
Date: 10/29/2019               Patient Name:  Glenn Brandt MRN: [id number]  DOB: [birth year] Age / Sex: 59 y.o., male   PCP: [facility] Service: Internal Medicine Teaching Service         Attending Physician: Dr. Heber Grandview, Rachel Moulds, DO    First Contact: Dr. Marva Panda Pager: [phone]  Second Contact: Dr. Truman Hayward Pager: [phone]       After Hours (After 5p/  First Contact Pager: [phone]  weekends / holidays): Second Contact Pager: [phone]   Chief Complaint: Dyspnea, Chest Pain  History of Present Illness: Mr Mccardle is a 59 yo M with a Hx of Complete Heart Block (s/p pacemaker in Feb 2021), HTN, Prostate Cancer, and ?CAD (recent cath looked okay per chart review) who presented with 2 days of SOB and a few hours of chest pain. He states he noticed a dry cough about 5 days ago and became short of breath about 3 days ago. It was mild and nagging at first, but became significantly worse Sunday evening and her experienced some chest pain at that time, which he associated with his frequent dry cough. He was not doing much on Sunday, but did spend time walking around downtown in the heat on Saturday. He does not have a history of COPD or Asthma but was started on an inhaler by his doctor at the [place] after wheezing was noted during an exam. He has never had PFTs done. He has about a 5 year smoking history, he had quit be restarted during the pandemic. He has been vaccinated for COVID-19 and denies sick contacts. He also denies fevers, nausea, diarrhea, constipation, or stomach pain.  ED work up showed mild leukocytosis at 10.7, Borderline BNP @ ~200, Flat hs Trop (8, 10), ABG WNL (appear to have been taken on BiPAP. He was treated with Solumedrol, Inhalers, and BiPAP with improvement in his symptoms. He was off BiPAP and speaking in full sentences when seen. He will be admitted for further work up and care.  Meds: Current Meds  Medication Sig  . acetaminophen (TYLENOL) 500 MG  tablet Take 500-1,000 mg by mouth every 6 (six) hours as needed for mild pain or headache.  . albuterol (PROVENTIL HFA;VENTOLIN HFA) 108 (90 BASE) MCG/ACT inhaler Inhale 2 puffs into the lungs 4 (four) times daily as needed for wheezing or shortness of breath.   [name] aspirin 81 MG chewable tablet Chew 81 mg by mouth daily.   . cetirizine (ZYRTEC) 10 MG tablet Take 10 mg by mouth daily as needed (for seasonal allergies).  . cyclobenzaprine (FLEXERIL) 10 MG tablet Take 10 mg by mouth at bedtime.  . diclofenac sodium (VOLTAREN) 1 % GEL Apply 4 g topically See admin instructions. Apply 4 grams to right knee, right hip, and/or lower back up to four times daily as needed for pain  . HYDROcodone-acetaminophen (NORCO/VICODIN) 5-325 MG tablet Take 1 tablet by mouth 3 (three) times daily as needed (for pain).   [name] lisinopril (ZESTRIL) 5 MG tablet Take 1 tablet (5 mg total) by mouth daily.  [name] omeprazole (PRILOSEC) 20 MG capsule Take 20 mg by mouth daily before breakfast.  . PARoxetine (PAXIL) 40 MG tablet Take 60 mg by mouth at bedtime.   . simvastatin (ZOCOR) 20 MG tablet Take 10 mg by mouth at bedtime.  . tamsulosin (FLOMAX) 0.4 MG CAPS capsule Take  0.8 mg by mouth every morning.   Allergies: Allergies as of 10/29/2019  . (No Known Allergies)   Past Medical History:  Diagnosis Date  . Coronary artery disease   . High cholesterol   . Hypertension   . 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 Prostate cancer ([place])   . PTSD (post-traumatic stress disorder)    Family History:  Family History  Problem Relation Age of Onset  . Prostate cancer Neg Hx    Social History:  Social History   Tobacco Use  . Smoking status: Former Smoker    Types: Cigarettes  . Smokeless tobacco: Never Used  Vaping Use  . Vaping Use: Never used  Substance Use Topics  . Alcohol use: Yes    Comment: socially  . Drug use: No   Review of Systems: A complete ROS was negative except as per HPI.  Physical  Exam: Blood pressure 110/78, pulse 84, temperature (!) 97.4 F (36.3 C), temperature source Oral, resp. rate (!) 23, height 5\' 11"  (1.803 m), weight 85.7 kg, SpO2 96 %. Physical Exam Constitutional:      General: He is not in acute distress.    Appearance: Normal appearance.  Cardiovascular:     Rate and Rhythm: Normal rate and regular rhythm.     Pulses: Normal pulses.     Heart sounds: Normal heart sounds.  Pulmonary:     Effort: Pulmonary effort is normal. No respiratory distress.     Breath sounds: Wheezing (diffuse) present.     Comments: ?Trace crackles right base Abdominal:     General: Bowel sounds are normal. There is no distension.     Palpations: Abdomen is soft.     Tenderness: There is no abdominal tenderness.  Musculoskeletal:        General: No swelling or deformity.  Skin:    General: Skin is warm and dry.  Neurological:     General: No focal deficit present.     Mental Status: Mental status is at baseline.    EKG: personally reviewed my interpretation is Tachycardic, ventricular paced, with mild ST elevation in V2 V3 (likely repolarization)  CXR: personally reviewed my interpretation is good rotation, inhalation and penetration. No acute disease.  Assessment & Plan by Problem: Active Problems:   Acute on chronic respiratory failure with hypoxia (HCC)  Acute on chronic respirator Failure with hypoxia ?COPD vs Asthma Exacerbation: Presented with worsening SOB x 3 days. Tropnin Flat, BNP indeterminate, CXR clear but with some ?hyperinflation on prior. ABG okay, but appear to have been done after starting BiPAP. Now off BiPAP speaking in full sentences on 2-3L [place]. He reports being on inhaler due to wheezing at PCP visit, no diagnosis, no PFTs. ~ 5 Pack year smoking history. - Supplemental O2 ([place] sats 88-92%) - Prednisone 40mg  Daily, stop date 6/25 - Duonebs q4h while awake for 1 day, then PRN - Albuterol PRN - Will need PFTs Outpatient  Complete Heart  Block: s/p Pacemaker in Feb 2021. Will monitor.  HTN: Holding home Lisinopril for now as BP is Good and he doesn't take everyday per med rec, will need to confirm this with him.  PTSD: Continue Paxil Hx Prostate Cancer: Continue Tamsulosin Chronic Pain: Continue home Norco (PDMP Appropriate) and Volatren HLD: Continue Statin GERD: Continue PPI  ?CAD: States he had a cath a few years ago when he was having palpations, which was reportedly normal. Those prior symptoms may have been a precursor to his  now known heart block.  FEN: Regular diet VTE ppx: Lovenox Code Status: FULL   Dispo: Admit patient to Inpatient with expected length of stay greater than 2 midnights.  Signed: Neva Seat, MD 10/29/2019, 8:13 AM  After 5pm on weekdays and 1pm on weekends: On Call pager: [phone]

## 2019-10-29 NOTE — ED Notes (Signed)
Lunch Tray Ordered @ 1050. 

## 2019-10-29 NOTE — Progress Notes (Signed)
Kedron Uno is a 59 y.o. male patient admitted from ED awake, alert - oriented  X 4 - no acute distress noted.  VSS - Blood pressure 128/75, pulse 94, temperature 98.4 F (36.9 C), temperature source Oral, resp. rate 16, height 5\' 11"  (1.803 m), weight 85.7 kg, SpO2 100 %.    IV in place, occlusive dsg intact without redness.  Orientation to room, and floor completed with information packet given to patient/family.  Patient declined safety video at this time.  Admission INP armband ID verified with patient/family, and in place.   SR up x 2, fall assessment complete, with patient and family able to verbalize understanding of risk associated with falls, and verbalized understanding to call nsg before up out of bed.  Call light within reach, patient able to voice, and demonstrate understanding.  Skin, clean-dry- intact without evidence of bruising, or skin tears.   No evidence of skin break down noted on exam.     Will cont to eval and treat per MD orders.  Luci Bank, RN 10/29/2019 7:01 PM

## 2019-10-29 NOTE — ED Notes (Signed)
Lunch tray delivered.

## 2019-10-29 NOTE — ED Triage Notes (Signed)
Pt states that he became SOB today, has got worse over the days, pt speaking in short sentences, audible wheezing. Some CP, dry cough for the past week, dizziness
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
## 2019-10-30 DIAGNOSIS — J9691 Respiratory failure, unspecified with hypoxia: Secondary | ICD-10-CM

## 2019-10-30 DIAGNOSIS — J441 Chronic obstructive pulmonary disease with (acute) exacerbation: Secondary | ICD-10-CM

## 2019-10-30 LAB — CBC
HCT: 41.6 % (ref 39.0–52.0)
Hemoglobin: 13.7 g/dL (ref 13.0–17.0)
MCH: 28.5 pg (ref 26.0–34.0)
MCHC: 32.9 g/dL (ref 30.0–36.0)
MCV: 86.7 fL (ref 80.0–100.0)
Platelets: 233 10*3/uL (ref 150–400)
RBC: 4.8 MIL/uL (ref 4.22–5.81)
RDW: 13.9 % (ref 11.5–15.5)
WBC: 11.2 10*3/uL — ABNORMAL HIGH (ref 4.0–10.5)
nRBC: 0 % (ref 0.0–0.2)

## 2019-10-30 MED ORDER — IPRATROPIUM-ALBUTEROL 0.5-2.5 (3) MG/3ML IN SOLN
3.0000 mL | RESPIRATORY_TRACT | Status: DC
  Administered 2019-10-30 (×3): 3 mL via RESPIRATORY_TRACT
  Filled 2019-10-30 (×3): qty 3

## 2019-10-30 MED ORDER — PREDNISONE 20 MG PO TABS: 40.0000 mg | ORAL_TABLET | Freq: Every day | ORAL | 0 refills | Status: AC

## 2019-10-30 MED ORDER — GUAIFENESIN 100 MG/5ML PO SOLN: 5.0000 mL | ORAL | 0 refills | Status: DC | PRN

## 2019-10-30 MED FILL — SM TUSSIN MUCUS-CONG 200 MG: 100 | 8 days supply | Qty: 236 | Fill #0

## 2019-10-30 MED FILL — predniSONE 20 MG TABS: 20 | 3 days supply | Qty: 6 | Fill #0

## 2019-10-30 NOTE — Progress Notes (Signed)
Subjective: [name] Overnight, no acute events reported.   [name] was examined and evaluated at bedside this am. He provides additional history stating he had difficulty with 'catching his breath' with significant wheezing. He mentions that his symptoms have improved since admission. He is continuing to endorse some wheezing with cough but mentions significant improvement with his nebulizer treatments. Also discussed importance of avoiding smoking cigarettes. He mentions motivation to pursue tobacco cessation.  Objective:  Vital signs in last 24 hours: Vitals:   10/29/19 2354 10/30/19 0420 10/30/19 0748 10/30/19 0843  BP: 112/69 125/77 116/77   Pulse: 84 79 93   Resp: 16 18 19    Temp: 98.3 F (36.8 C) 98.6 F (37 C) 98.7 F (37.1 C)   TempSrc: Oral Oral Oral   SpO2: 98% 99% 97% 97%  Weight:  85 kg    Height:  5\' 11"  (1.803 m)     CBC Latest Ref Rng & Units 10/30/2019 10/29/2019 10/29/2019  WBC 4.0 - 10.5 K/uL 11.2(H) - 10.7(H)  Hemoglobin 13.0 - 17.0 g/dL 13.7 15.3 15.2  Hematocrit 39 - 52 % 41.6 45.0 47.2  Platelets 150 - 400 K/uL 233 - 252   BMP Latest Ref Rng & Units 10/29/2019 10/29/2019 07/03/2019  Glucose 70 - 99 mg/dL - 125(H) 95  BUN 8 - 23 mg/dL - 10 8  Creatinine 0.61 - 1.24 mg/dL - 1.09 1.17  Sodium 135 - 145 mmol/L 138 137 136  Potassium 3.5 - 5.1 mmol/L 4.1 4.2 3.8  Chloride 98 - 111 mmol/L - 106 106  CO2 22 - 32 mmol/L - 21(L) 21(L)  Calcium 8.9 - 10.3 mg/dL - 9.3 8.6(L)   Physical Exam  Constitutional: Appears well-developed and well-nourished. No distress.  HENT: Normocephalic and atraumatic. MMM, EOMI, conjunctivae nl Cardiovascular: Normal rate, regular rhythm, S1 and S2 present, no murmurs/rubs/gallops appreciated  Respiratory: No respiratory distress on room air; normal effort; expiratory wheezing more prominent at lung apices bilaterally, no rales or rhonchi appreciated GI: Soft. Bowel sounds are normal. No distension. There is no tenderness.   Musculoskeletal: No edema.  Neurological: Is alert and oriented x4, no apparent focal deficits  Skin: Warm and dry.   Assessment/Plan:  Active Problems:   Acute on chronic respiratory failure with hypoxia [facility]) Mr. Glenn Brandt is a 59 year old male with history of complete heart block s/p pacemaker in 06/2019, hypertension, PTSD, and hyperlipidemia admitted for acute hypoxic respiratory failure.  Acute hypoxic respiratory failure 2/2 COPD vs asthma exacerbation: Patient reports one week history of dry cough followed by progressively worsening shortness of breath. Initially requiring BiPAP. Patient improved with steroids and duonebs. CXR with some hyperinflation. Patient has prescription for albuterol from PCP but no formal PFTs. He notes using albuterol inhaler 1-2 times per month. Denies any recent sick contacts although he does have a 67 and 61 yr old at home, may have been triggered by respiratory virus. On examination, patient is back to room air; although does continue to endorse dry cough. Lung auscultation with diffuse expiratory wheezing, most significant at bilateral apices.  Patient advised to follow up with PCP for PFTs and further evaluation for possible COPD. Also advised for smoking cessation for which patient expresses understanding and motivation.  - Ambulatory pulse oximetry - Prednisone 40mg  day 2/5 - Duonebs prn - Continue albuterol prn at discharge - PFTs as outpatient   FEN/GI Diet:  Regular  Fluids: None Electrolytes: Monitor and replete prn  DVT prophx: Lovenox Code status:  FULL   Prior to Admission Living Arrangement: Home Anticipated Discharge Location: Home Barriers to Discharge: None  Dispo: Anticipated discharge today.   Harvie Heck, MD  Internal Medicine, PGY-1 10/30/2019, 9:16 AM Pager: [phone] After 5pm on weekdays and 1pm on weekends: On Call Pager: [phone]

## 2019-10-30 NOTE — Progress Notes (Signed)
Daris Shenk to be D/C'd Home per MD order.  Discussed with the patient and all questions fully answered.  VSS, Skin clean, dry and intact without evidence of skin break down, no evidence of skin tears noted. IV catheter discontinued intact. Site without signs and symptoms of complications. Dressing and pressure applied.  An After Visit Summary was printed and given to the patient. Patient received prescription.  D/c education completed with patient/family including follow up instructions, medication list, d/c activities limitations if indicated, with other d/c instructions as indicated by MD - patient able to verbalize understanding, all questions fully answered.   Patient instructed to return to ED, call 911, or call MD for any changes in condition.   Patient escorted via [place], and D/C home via private auto.  Luci Bank 10/30/2019 4:25 PM

## 2019-10-30 NOTE — Progress Notes (Signed)
SATURATION QUALIFICATIONS: (This note is used to comply with regulatory documentation for home oxygen)  Patient Saturations on Room Air at Rest = 94%  Patient Saturations on Room Air while Ambulating = 90%   Please briefly explain why patient needs home oxygen: Pt does not drop bellow 88% on room air while ambulating, therefore he does not qualify for home oxygen .

## 2019-10-30 NOTE — Discharge Summary (Addendum)
Name: Glenn Brandt MRN: [id number] DOB: 30-Dec-1960 59 y.o. PCP: [facility]  Date of Admission: 10/29/2019  2:50 AM Date of Discharge:  10/30/2019 Attending Physician: Glenn Groves, DO  Discharge Diagnosis: 1. Acute hypoxic respiratory failure 2/2 suspected COPD exacerbation 2. Complete heart block 3. Hypertension   Discharge Medications: Allergies as of 10/30/2019   No Known Allergies      Medication List     TAKE these medications    acetaminophen 500 MG tablet Commonly known as: TYLENOL Take 500-1,000 mg by mouth every 6 (six) hours as needed for mild pain or headache.   albuterol 108 (90 Base) MCG/ACT inhaler Commonly known as: VENTOLIN HFA Inhale 2 puffs into the lungs 4 (four) times daily as needed for wheezing or shortness of breath.   aspirin 81 MG chewable tablet Chew 81 mg by mouth daily.   cetirizine 10 MG tablet Commonly known as: ZYRTEC Take 10 mg by mouth daily as needed (for seasonal allergies).   cyclobenzaprine 10 MG tablet Commonly known as: FLEXERIL Take 10 mg by mouth at bedtime.   diclofenac sodium 1 % Gel Commonly known as: VOLTAREN Apply 4 g topically See admin instructions. Apply 4 grams to right knee, right hip, and/or lower back up to four times daily as needed for pain   guaiFENesin 100 MG/5ML Soln Commonly known as: ROBITUSSIN Take 5 mLs (100 mg total) by mouth every 4 (four) hours as needed for cough or to loosen phlegm.   HYDROcodone-acetaminophen 5-325 MG tablet Commonly known as: NORCO/VICODIN Take 1 tablet by mouth 3 (three) times daily as needed (for pain).   lisinopril 5 MG tablet Commonly known as: ZESTRIL Take 1 tablet (5 mg total) by mouth daily.   omeprazole 20 MG capsule Commonly known as: PRILOSEC Take 20 mg by mouth daily before breakfast.   PARoxetine 40 MG tablet Commonly known as: PAXIL Take 60 mg by mouth at bedtime.   predniSONE 20 MG tablet Commonly known as: DELTASONE Take 2  tablets (40 mg total) by mouth daily with breakfast for 3 days. Start taking on: October 31, 2019   simvastatin 20 MG tablet Commonly known as: ZOCOR Take 10 mg by mouth at bedtime.   tamsulosin 0.4 MG Caps capsule Commonly known as: FLOMAX Take 0.8 mg by mouth every morning.        Disposition and follow-up:   [name] was discharged from [facility] in Stable condition.  At the hospital follow up visit please address:  1.  Acute hypoxic respiratory failure: - Patient discharged on prednisone 40mg  daily (end date 11/02/2019) - Patient discharged with albuterol prn  - Patient will need f/u PFT tests to assess for COPD vs asthma  Complete heart block: Has pacemaker in place. Stable  Hypertension: Continue lisinopril 5mg  daily   2.  Labs / imaging needed at time of follow-up: None  3.  Pending labs/ test needing follow-up: None   Follow-up Appointments:  Follow-up [facility]. Schedule an appointment as soon as possible for a visit in 1 week(s).   Specialty: General Practice Contact information: [place] [id number] [place] Hospital Course by problem list: 1. Acute hypoxic respiratory failure 2/2 suspected COPD exacerbation Patient with one week history of dry cough followed by progressively worsening dyspnea. Initially required BiPAP on presentation but quickly improved with  steroids and duonebs. CXR with some hyperinflation noted; no formal PFTs in chart. Patient has prescription for albuterol from his PCP and notes using it about 1-2 times per month. Patient notes ongoing tobacco use. Suspect he may have component of underlying COPD vs asthma given diffuse expiratory wheezing on examination. Current episode of acute respiratory failure may have been triggered by a respiratory virus as he has a 61year old and 61 year old at home. Patient ambulated without any significant decrease in  oxygenation. Patient discharged home with prednisone 40mg  daily for 3 days to complete 5 day course and albuterol as needed. He is recommended for smoking cessation and would benefit from formal PFTs as outpatient.   2. Complete heart block Patient had pacemaker placed in February 2021. No abnormalities noted during hospitalization. Recommend routine follow up.   3. Hypertension Well controlled. Continue lisinopril 5mg  daily.   Discharge Vitals:   BP 116/77 (BP Location: Left Arm)   Pulse 93   Temp 98.7 F (37.1 C) (Oral)   Resp 19   Ht 5\' 11"  (1.803 m)   Wt 85 kg   SpO2 97%   BMI 26.14 kg/m   Pertinent Labs, Studies, and Procedures:  CBC Latest Ref Rng & Units 10/30/2019 10/29/2019 10/29/2019  WBC 4.0 - 10.5 K/uL 11.2(H) - 10.7(H)  Hemoglobin 13.0 - 17.0 g/dL 13.7 15.3 15.2  Hematocrit 39 - 52 % 41.6 45.0 47.2  Platelets 150 - 400 K/uL 233 - 252   BMP Latest Ref Rng & Units 10/29/2019 10/29/2019 07/03/2019  Glucose 70 - 99 mg/dL - 125(H) 95  BUN 8 - 23 mg/dL - 10 8  Creatinine 0.61 - 1.24 mg/dL - 1.09 1.17  Sodium 135 - 145 mmol/L 138 137 136  Potassium 3.5 - 5.1 mmol/L 4.1 4.2 3.8  Chloride 98 - 111 mmol/L - 106 106  CO2 22 - 32 mmol/L - 21(L) 21(L)  Calcium 8.9 - 10.3 mg/dL - 9.3 8.6(L)   B Natriuretic Peptide 0.0 - 100.0 pg/mL 192.3 High     Troponin I (High Sensitivity) <18 ng/L 10  8     Component     Latest Ref Rng & Units 10/29/2019         4:05 AM  pH, Arterial     7.35 - 7.45 7.413  pCO2 arterial     32 - 48 mmHg 36.3  pO2, Arterial     83 - 108 mmHg 205 (H)  Bicarbonate     20.0 - 28.0 mmol/L 23.3  TCO2     22 - 32 mmol/L 24  O2 Saturation     % 100.0  Acid-base deficit     0.0 - 2.0 mmol/L 1.0  Sodium     135 - 145 mmol/L 138  Potassium     3.5 - 5.1 mmol/L 4.1  Calcium Ionized     1.15 - 1.40 mmol/L 1.21  HCT     39 - 52 % 45.0  Hemoglobin     13.0 - 17.0 g/dL 15.3  Patient temperature      97.4 F  Sample type      ARTERIAL   CXR  10/29/2019: FINDINGS: The heart size and mediastinal contours are within normal limits. A left-sided pacemaker seen with the lead tips at the right atrium and right ventricle. Both lungs are clear. The visualized skeletal structures are unremarkable.  Discharge Instructions: Discharge Instructions     Call MD for:  difficulty breathing, headache or visual disturbances  Complete by: As directed    Call MD for:  extreme fatigue   Complete by: As directed    Call MD for:  persistant dizziness or light-headedness   Complete by: As directed    Call MD for:  persistant nausea and vomiting   Complete by: As directed    Call MD for:  severe uncontrolled pain   Complete by: As directed    Call MD for:  temperature >100.4   Complete by: As directed    Diet - low sodium heart healthy   Complete by: As directed    Discharge instructions   Complete by: As directed    Mr. Misty, Rago were admitted to the hospital with shortness of breath and improved with BiPAP, steroids and nebulizer treatments. On discharge, please continue to take prednisone 40mg  daily for 3 days (to complete 5 day course). You may continue to use your albuterol inhaler as needed (up to 4 times per day) for shortness of breath. You may also take guaifenesin as needed for your cough. Please follow up with your primary care doctor at discharge. You will need pulmonary function tests for possible COPD vs asthma.   If your shortness of breath does not improve or gets worse over the next few days, please seek urgent medical care.  Thank you!   Increase activity slowly   Complete by: As directed        Signed: Harvie Heck, MD  [facility] PGY-1 10/30/2019, 9:58 AM   Pager: [phone]

## 2019-11-04 ENCOUNTER — Emergency Department (HOSPITAL_COMMUNITY)
Admission: EM | Admit: 2019-11-04 | Discharge: 2019-11-04 | Disposition: A | Discharge status: Home / Self Care | Payer: No Typology Code available for payment source | Source: Home / Self Care | Attending: Emergency Medicine | Admitting: Emergency Medicine

## 2019-11-04 ENCOUNTER — Emergency Department (HOSPITAL_COMMUNITY): Payer: No Typology Code available for payment source

## 2019-11-04 ENCOUNTER — Encounter (HOSPITAL_COMMUNITY): Payer: Self-pay | Admitting: Emergency Medicine

## 2019-11-04 ENCOUNTER — Other Ambulatory Visit: Payer: Self-pay

## 2019-11-04 DIAGNOSIS — J441 Chronic obstructive pulmonary disease with (acute) exacerbation: Secondary | ICD-10-CM

## 2019-11-04 DIAGNOSIS — Z7982 Long term (current) use of aspirin: Secondary | ICD-10-CM | POA: Insufficient documentation

## 2019-11-04 DIAGNOSIS — Z8546 Personal history of malignant neoplasm of prostate: Secondary | ICD-10-CM | POA: Insufficient documentation

## 2019-11-04 DIAGNOSIS — I10 Essential (primary) hypertension: Secondary | ICD-10-CM | POA: Insufficient documentation

## 2019-11-04 DIAGNOSIS — J069 Acute upper respiratory infection, unspecified: Secondary | ICD-10-CM

## 2019-11-04 DIAGNOSIS — I2510 Atherosclerotic heart disease of native coronary artery without angina pectoris: Secondary | ICD-10-CM | POA: Insufficient documentation

## 2019-11-04 DIAGNOSIS — R05 Cough: Secondary | ICD-10-CM | POA: Insufficient documentation

## 2019-11-04 DIAGNOSIS — Z950 Presence of cardiac pacemaker: Secondary | ICD-10-CM | POA: Insufficient documentation

## 2019-11-04 DIAGNOSIS — Z79899 Other long term (current) drug therapy: Secondary | ICD-10-CM | POA: Insufficient documentation

## 2019-11-04 DIAGNOSIS — Z87891 Personal history of nicotine dependence: Secondary | ICD-10-CM | POA: Insufficient documentation

## 2019-11-04 LAB — CBC
HCT: 43.5 % (ref 39.0–52.0)
Hemoglobin: 13.9 g/dL (ref 13.0–17.0)
MCH: 27.9 pg (ref 26.0–34.0)
MCHC: 32 g/dL (ref 30.0–36.0)
MCV: 87.3 fL (ref 80.0–100.0)
Platelets: 266 10*3/uL (ref 150–400)
RBC: 4.98 MIL/uL (ref 4.22–5.81)
RDW: 13.7 % (ref 11.5–15.5)
WBC: 7.2 10*3/uL (ref 4.0–10.5)
nRBC: 0 % (ref 0.0–0.2)

## 2019-11-04 LAB — BASIC METABOLIC PANEL
Anion gap: 8 (ref 5–15)
BUN: 9 mg/dL (ref 8–23)
CO2: 27 mmol/L (ref 22–32)
Calcium: 9.3 mg/dL (ref 8.9–10.3)
Chloride: 104 mmol/L (ref 98–111)
Creatinine, Ser: 0.97 mg/dL (ref 0.61–1.24)
GFR calc Af Amer: >60 mL/min (ref >60)
GFR calc non Af Amer: >60 mL/min (ref >60)
Glucose, Bld: 116 mg/dL — ABNORMAL HIGH (ref 70–99)
Potassium: 3.9 mmol/L (ref 3.5–5.1)
Sodium: 139 mmol/L (ref 135–145)

## 2019-11-04 MED ORDER — SODIUM CHLORIDE 0.9% FLUSH: 3.0000 mL | Freq: Once | INTRAVENOUS | Status: DC

## 2019-11-04 MED ORDER — DOXYCYCLINE HYCLATE 100 MG PO CAPS
100.0000 mg | ORAL_CAPSULE | Freq: Two times a day (BID) | ORAL | 0 refills | Status: DC
Start: 2019-11-04 — End: 2019-11-08

## 2019-11-04 MED ORDER — BENZONATATE 100 MG PO CAPS
100.0000 mg | ORAL_CAPSULE | Freq: Three times a day (TID) | ORAL | 0 refills | Status: DC
Start: 2019-11-04 — End: 2019-12-14

## 2019-11-04 MED ORDER — ALBUTEROL (5 MG/ML) CONTINUOUS INHALATION SOLN
10.0000 mg | INHALATION_SOLUTION | RESPIRATORY_TRACT | Status: AC
  Administered 2019-11-04: 10 mg via RESPIRATORY_TRACT
  Filled 2019-11-04: qty 20

## 2019-11-04 NOTE — ED Triage Notes (Addendum)
C/o SOB and cough since 6/19 that is getting worse.  States he was seen here on 6/21 for same.  States he is now having syncopal episodes caused by coughing.  Using inhaler at home without relief.  Reports headache and pain across chest with coughing.

## 2019-11-04 NOTE — Discharge Instructions (Signed)
Doxycycline 100mg  by mouth twice daily until the medicine is completely finished - this medicine is a strong antibiotic that treats certain infections.   ER for worsening symptoms Continue taking your albuterol - 2 puffs every 4 hours for 24 hours, Tessalon every 8 hours as needed for cough No tobacco use  You may continue to cough for a couple of weeks - sometimes it takes a long time for this to improved - be patient and get plenty or rest.  If you feel that you are having passing out spells, then don't drive until you have completely healed.  ER for worsening symptoms.  Keep your appointment with your doctor tomorrow.
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
## 2019-11-05 ENCOUNTER — Other Ambulatory Visit: Payer: Self-pay

## 2019-11-05 ENCOUNTER — Encounter (HOSPITAL_COMMUNITY): Payer: Self-pay | Admitting: Pharmacy Technician

## 2019-11-05 ENCOUNTER — Emergency Department (HOSPITAL_COMMUNITY): Payer: No Typology Code available for payment source

## 2019-11-05 ENCOUNTER — Inpatient Hospital Stay (HOSPITAL_COMMUNITY)
Admission: EM | Admit: 2019-11-05 | Discharge: 2019-11-08 | DRG: 190 | Disposition: A | Discharge status: Home / Self Care | Payer: No Typology Code available for payment source | Attending: Internal Medicine | Admitting: Internal Medicine

## 2019-11-05 DIAGNOSIS — Z950 Presence of cardiac pacemaker: Secondary | ICD-10-CM | POA: Diagnosis not present

## 2019-11-05 DIAGNOSIS — Z8546 Personal history of malignant neoplasm of prostate: Secondary | ICD-10-CM

## 2019-11-05 DIAGNOSIS — J441 Chronic obstructive pulmonary disease with (acute) exacerbation: Secondary | ICD-10-CM

## 2019-11-05 DIAGNOSIS — I442 Atrioventricular block, complete: Secondary | ICD-10-CM | POA: Diagnosis present

## 2019-11-05 DIAGNOSIS — I10 Essential (primary) hypertension: Secondary | ICD-10-CM | POA: Diagnosis present

## 2019-11-05 DIAGNOSIS — R55 Syncope and collapse: Secondary | ICD-10-CM | POA: Diagnosis not present

## 2019-11-05 DIAGNOSIS — J9621 Acute and chronic respiratory failure with hypoxia: Secondary | ICD-10-CM | POA: Diagnosis present

## 2019-11-05 DIAGNOSIS — K219 Gastro-esophageal reflux disease without esophagitis: Secondary | ICD-10-CM | POA: Diagnosis present

## 2019-11-05 DIAGNOSIS — J9601 Acute respiratory failure with hypoxia: Secondary | ICD-10-CM

## 2019-11-05 DIAGNOSIS — E785 Hyperlipidemia, unspecified: Secondary | ICD-10-CM | POA: Diagnosis not present

## 2019-11-05 DIAGNOSIS — F4310 Post-traumatic stress disorder, unspecified: Secondary | ICD-10-CM | POA: Diagnosis present

## 2019-11-05 DIAGNOSIS — R911 Solitary pulmonary nodule: Secondary | ICD-10-CM | POA: Diagnosis present

## 2019-11-05 DIAGNOSIS — F17210 Nicotine dependence, cigarettes, uncomplicated: Secondary | ICD-10-CM | POA: Diagnosis present

## 2019-11-05 DIAGNOSIS — Z7982 Long term (current) use of aspirin: Secondary | ICD-10-CM

## 2019-11-05 DIAGNOSIS — Z79899 Other long term (current) drug therapy: Secondary | ICD-10-CM

## 2019-11-05 DIAGNOSIS — I2510 Atherosclerotic heart disease of native coronary artery without angina pectoris: Secondary | ICD-10-CM | POA: Diagnosis present

## 2019-11-05 DIAGNOSIS — J438 Other emphysema: Principal | ICD-10-CM | POA: Diagnosis present

## 2019-11-05 DIAGNOSIS — Z20822 Contact with and (suspected) exposure to covid-19: Secondary | ICD-10-CM | POA: Diagnosis present

## 2019-11-05 DIAGNOSIS — G8929 Other chronic pain: Secondary | ICD-10-CM | POA: Diagnosis present

## 2019-11-05 LAB — HEPATIC FUNCTION PANEL
ALT: 18 U/L (ref 0–44)
AST: 26 U/L (ref 15–41)
Albumin: 3.8 g/dL (ref 3.5–5.0)
Alkaline Phosphatase: 35 U/L — ABNORMAL LOW (ref 38–126)
Bilirubin, Direct: 0.1 mg/dL (ref 0.0–0.2)
Indirect Bilirubin: 0.4 mg/dL (ref 0.3–0.9)
Total Bilirubin: 0.5 mg/dL (ref 0.3–1.2)
Total Protein: 6.8 g/dL (ref 6.5–8.1)

## 2019-11-05 LAB — CBC WITH DIFFERENTIAL/PLATELET
Abs Immature Granulocytes: 0.04 10*3/uL (ref 0.00–0.07)
Abs Immature Granulocytes: 0.02 10*3/uL (ref 0.00–0.07)
Basophils Absolute: 0 10*3/uL (ref 0.0–0.1)
Basophils Absolute: 0 10*3/uL (ref 0.0–0.1)
Basophils Relative: 0 %
Basophils Relative: 0 %
Eosinophils Absolute: 0.6 10*3/uL — ABNORMAL HIGH (ref 0.0–0.5)
Eosinophils Absolute: 0 10*3/uL (ref 0.0–0.5)
Eosinophils Relative: 5 %
Eosinophils Relative: 0 %
HCT: 44.1 % (ref 39.0–52.0)
HCT: 41 % (ref 39.0–52.0)
Hemoglobin: 13.9 g/dL (ref 13.0–17.0)
Hemoglobin: 13.3 g/dL (ref 13.0–17.0)
Immature Granulocytes: 0 %
Immature Granulocytes: 0 %
Lymphocytes Relative: 7 %
Lymphocytes Relative: 3 %
Lymphs Abs: 0.9 10*3/uL (ref 0.7–4.0)
Lymphs Abs: 0.2 10*3/uL — ABNORMAL LOW (ref 0.7–4.0)
MCH: 28 pg (ref 26.0–34.0)
MCH: 28.2 pg (ref 26.0–34.0)
MCHC: 31.5 g/dL (ref 30.0–36.0)
MCHC: 32.4 g/dL (ref 30.0–36.0)
MCV: 88.7 fL (ref 80.0–100.0)
MCV: 87 fL (ref 80.0–100.0)
Monocytes Absolute: 1.1 10*3/uL — ABNORMAL HIGH (ref 0.1–1.0)
Monocytes Absolute: 0.1 10*3/uL (ref 0.1–1.0)
Monocytes Relative: 9 %
Monocytes Relative: 1 %
Neutro Abs: 10.1 10*3/uL — ABNORMAL HIGH (ref 1.7–7.7)
Neutro Abs: 7.4 10*3/uL (ref 1.7–7.7)
Neutrophils Relative %: 79 %
Neutrophils Relative %: 96 %
Platelets: 269 10*3/uL (ref 150–400)
Platelets: 271 10*3/uL (ref 150–400)
RBC: 4.97 MIL/uL (ref 4.22–5.81)
RBC: 4.71 MIL/uL (ref 4.22–5.81)
RDW: 13.5 % (ref 11.5–15.5)
RDW: 13.5 % (ref 11.5–15.5)
WBC: 12.7 10*3/uL — ABNORMAL HIGH (ref 4.0–10.5)
WBC: 7.8 10*3/uL (ref 4.0–10.5)
nRBC: 0 % (ref 0.0–0.2)
nRBC: 0 % (ref 0.0–0.2)

## 2019-11-05 LAB — BASIC METABOLIC PANEL
Anion gap: 10 (ref 5–15)
BUN: 12 mg/dL (ref 8–23)
CO2: 24 mmol/L (ref 22–32)
Calcium: 9 mg/dL (ref 8.9–10.3)
Chloride: 106 mmol/L (ref 98–111)
Creatinine, Ser: 0.92 mg/dL (ref 0.61–1.24)
GFR calc Af Amer: >60 mL/min (ref >60)
GFR calc non Af Amer: >60 mL/min (ref >60)
Glucose, Bld: 140 mg/dL — ABNORMAL HIGH (ref 70–99)
Potassium: 4.2 mmol/L (ref 3.5–5.1)
Sodium: 140 mmol/L (ref 135–145)

## 2019-11-05 LAB — SARS CORONAVIRUS 2 BY RT PCR (HOSPITAL ORDER, PERFORMED IN ~~LOC~~ HOSPITAL LAB): SARS Coronavirus 2: NEGATIVE

## 2019-11-05 LAB — BRAIN NATRIURETIC PEPTIDE: B Natriuretic Peptide: 140.5 pg/mL — ABNORMAL HIGH (ref 0.0–100.0)

## 2019-11-05 LAB — TROPONIN I (HIGH SENSITIVITY)
Troponin I (High Sensitivity): 13 ng/L (ref <18)
Troponin I (High Sensitivity): 29 ng/L — ABNORMAL HIGH (ref <18)

## 2019-11-05 MED ORDER — LISINOPRIL 5 MG PO TABS
5.0000 mg | ORAL_TABLET | Freq: Every day | ORAL | Status: DC
  Administered 2019-11-06 – 2019-11-07 (×2): 5 mg via ORAL
  Filled 2019-11-05 (×2): qty 1

## 2019-11-05 MED ORDER — DICLOFENAC SODIUM 1 % TD GEL
4.0000 g | Freq: Four times a day (QID) | TRANSDERMAL | Status: DC | PRN
  Filled 2019-11-05: qty 100

## 2019-11-05 MED ORDER — TAMSULOSIN HCL 0.4 MG PO CAPS
0.8000 mg | ORAL_CAPSULE | ORAL | Status: DC
  Administered 2019-11-06 – 2019-11-08 (×3): 0.8 mg via ORAL
  Filled 2019-11-05 (×3): qty 2

## 2019-11-05 MED ORDER — IPRATROPIUM-ALBUTEROL 0.5-2.5 (3) MG/3ML IN SOLN
3.0000 mL | Freq: Three times a day (TID) | RESPIRATORY_TRACT | Status: DC
  Administered 2019-11-06 – 2019-11-07 (×4): 3 mL via RESPIRATORY_TRACT
  Filled 2019-11-05 (×4): qty 3

## 2019-11-05 MED ORDER — DOXYCYCLINE HYCLATE 100 MG PO TABS
100.0000 mg | ORAL_TABLET | Freq: Two times a day (BID) | ORAL | Status: DC
  Administered 2019-11-05 – 2019-11-08 (×6): 100 mg via ORAL
  Filled 2019-11-05 (×6): qty 1

## 2019-11-05 MED ORDER — PAROXETINE HCL 30 MG PO TABS
60.0000 mg | ORAL_TABLET | Freq: Every day | ORAL | Status: DC
  Administered 2019-11-05 – 2019-11-07 (×3): 60 mg via ORAL
  Filled 2019-11-05 (×5): qty 2

## 2019-11-05 MED ORDER — PREDNISONE 20 MG PO TABS
40.0000 mg | ORAL_TABLET | Freq: Every day | ORAL | Status: DC
  Administered 2019-11-06 – 2019-11-08 (×3): 40 mg via ORAL
  Filled 2019-11-05 (×3): qty 2

## 2019-11-05 MED ORDER — IPRATROPIUM-ALBUTEROL 0.5-2.5 (3) MG/3ML IN SOLN
3.0000 mL | Freq: Four times a day (QID) | RESPIRATORY_TRACT | Status: DC
  Administered 2019-11-05: 3 mL via RESPIRATORY_TRACT
  Filled 2019-11-05: qty 3

## 2019-11-05 MED ORDER — ALBUTEROL SULFATE (2.5 MG/3ML) 0.083% IN NEBU: 2.5000 mg | INHALATION_SOLUTION | RESPIRATORY_TRACT | Status: DC | PRN

## 2019-11-05 MED ORDER — SODIUM CHLORIDE 0.9% FLUSH
3.0000 mL | Freq: Two times a day (BID) | INTRAVENOUS | Status: DC
  Administered 2019-11-05 – 2019-11-08 (×7): 3 mL via INTRAVENOUS

## 2019-11-05 MED ORDER — GUAIFENESIN 100 MG/5ML PO SOLN
5.0000 mL | ORAL | Status: DC | PRN
  Administered 2019-11-06: 100 mg via ORAL
  Filled 2019-11-05 (×2): qty 5

## 2019-11-05 MED ORDER — PRAZOSIN HCL 1 MG PO CAPS
3.0000 mg | ORAL_CAPSULE | Freq: Every day | ORAL | Status: DC
  Filled 2019-11-05: qty 3

## 2019-11-05 MED ORDER — IPRATROPIUM-ALBUTEROL 0.5-2.5 (3) MG/3ML IN SOLN: 3.0000 mL | RESPIRATORY_TRACT | Status: DC | PRN

## 2019-11-05 MED ORDER — ENOXAPARIN SODIUM 40 MG/0.4ML ~~LOC~~ SOLN
40.0000 mg | SUBCUTANEOUS | Status: DC
  Administered 2019-11-05 – 2019-11-08 (×4): 40 mg via SUBCUTANEOUS
  Filled 2019-11-05 (×4): qty 0.4

## 2019-11-05 MED ORDER — ALBUTEROL (5 MG/ML) CONTINUOUS INHALATION SOLN
10.0000 mg/h | INHALATION_SOLUTION | RESPIRATORY_TRACT | Status: DC
  Administered 2019-11-05: 10 mg/h via RESPIRATORY_TRACT
  Filled 2019-11-05: qty 20

## 2019-11-05 MED ORDER — IPRATROPIUM-ALBUTEROL 0.5-2.5 (3) MG/3ML IN SOLN: 3.0000 mL | RESPIRATORY_TRACT | Status: DC

## 2019-11-05 MED ORDER — HYDROCODONE-ACETAMINOPHEN 5-325 MG PO TABS: 1.0000 | ORAL_TABLET | Freq: Three times a day (TID) | ORAL | Status: DC | PRN

## 2019-11-05 MED ORDER — PANTOPRAZOLE SODIUM 40 MG PO TBEC
40.0000 mg | DELAYED_RELEASE_TABLET | Freq: Every day | ORAL | Status: DC
  Administered 2019-11-06 – 2019-11-08 (×3): 40 mg via ORAL
  Filled 2019-11-05 (×3): qty 1

## 2019-11-05 MED ORDER — CYCLOBENZAPRINE HCL 10 MG PO TABS
10.0000 mg | ORAL_TABLET | Freq: Every day | ORAL | Status: DC
  Administered 2019-11-05 – 2019-11-07 (×3): 10 mg via ORAL
  Filled 2019-11-05 (×3): qty 1

## 2019-11-05 MED ORDER — ALBUTEROL SULFATE HFA 108 (90 BASE) MCG/ACT IN AERS: 2.0000 | INHALATION_SPRAY | Freq: Four times a day (QID) | RESPIRATORY_TRACT | Status: DC | PRN

## 2019-11-05 MED ORDER — SIMVASTATIN 20 MG PO TABS
10.0000 mg | ORAL_TABLET | Freq: Every day | ORAL | Status: DC
  Administered 2019-11-05 – 2019-11-07 (×3): 10 mg via ORAL
  Filled 2019-11-05 (×3): qty 1

## 2019-11-05 MED ORDER — PRAZOSIN HCL 2 MG PO CAPS
3.0000 mg | ORAL_CAPSULE | Freq: Every day | ORAL | Status: DC
  Administered 2019-11-06 – 2019-11-08 (×3): 3 mg via ORAL
  Filled 2019-11-05 (×3): qty 1

## 2019-11-05 NOTE — ED Notes (Signed)
Pt taken off of Bipap and placed on 4L [place]

## 2019-11-05 NOTE — H&P (Addendum)
Date: 11/05/2019               Patient Name:  Glenn Brandt MRN: [id number]  DOB: 1961-04-11 Age / Sex: 59 y.o., male   PCP: [facility] Service: Internal Medicine Teaching Service         Attending Physician: Dr. Heber Callaway, Rachel Moulds, DO    First Contact: Dr. Marva Panda Pager: [phone]  Second Contact: Dr. Truman Hayward Pager: [phone]       After Hours (After 5p/  First Contact Pager: [phone]  weekends / holidays): Second Contact Pager: [phone]   Chief Complaint: Dyspnea, Chest Pain  History of Present Illness: Glenn Brandt is a 59 yo M with a Hx of Complete Heart Block (s/p pacemaker in Feb 2021), HTN, Prostate Cancer, and ?CAD (recent cath looked okay per chart review) who presents with 2 days of SOB. He was recently admitted 6/21-6/22 for similar presentation, believed to be undiagnosed COPD. He did well initially after discharge until yesterday when He was seen in the ED for coughing spells and arm tingling, he was saturating well on room air at that time and responded well to a continuous albuterol neb. He was discharged with Doxycycline and Tessalon pearls. He states (through BiPAP machine), that he became significantly SOB this morning after his wife left for work. He has his daughter call 911 and was brought to the ED. EMS noted patient to be hypoxic to 60s% on room air and patient was placed on CPAP and given albuterol, Atrovent, Mg, and Solumedrol with improvement. Saturating well on BiPAP in ED.  As noted previously; He does not have a history of COPD or Asthma but was started on an inhaler by his doctor at the [facility] after wheezing was noted during an exam. He has never had PFTs done. He has about a 5 year smoking history, he had quit, but restarted during the pandemic and quit again after recent admission. He has been vaccinated for COVID-19 and denies sick contacts. He also denies fevers, nausea, diarrhea, constipation, chest pain, palpitations, head ache, or stomach  pain.  ED work up showed mild leukocytosis at 12, Negativet hsTrop, and other wise normal labs. BNP pending. CXR WNL. EKG showed A-V paced rhythm with normal rate. He was on BiPAP when seen, suspect he should be able to come off soon.  Meds: Current Meds  Medication Sig   acetaminophen (TYLENOL) 500 MG tablet Take 500-1,000 mg by mouth every 6 (six) hours as needed for mild pain or headache.   albuterol (PROVENTIL HFA;VENTOLIN HFA) 108 (90 BASE) MCG/ACT inhaler Inhale 2 puffs into the lungs 4 (four) times daily as needed for wheezing or shortness of breath.    benzonatate (TESSALON) 100 MG capsule Take 1 capsule (100 mg total) by mouth every 8 (eight) hours.   cetirizine (ZYRTEC) 10 MG tablet Take 10 mg by mouth daily as needed (for seasonal allergies).   cyclobenzaprine (FLEXERIL) 10 MG tablet Take 10 mg by mouth at bedtime.   diclofenac sodium (VOLTAREN) 1 % GEL Apply 4 g topically See admin instructions. Apply 4 grams to right knee, right hip, and/or lower back up to four times daily as needed for pain   doxycycline (VIBRAMYCIN) 100 MG capsule Take 1 capsule (100 mg total) by mouth 2 (two) times daily for 7 days.   HYDROcodone-acetaminophen (NORCO/VICODIN) 5-325 MG tablet Take 1 tablet by mouth 3 (three) times daily as needed (for pain).  lisinopril (ZESTRIL) 5 MG tablet Take 1 tablet (5 mg total) by mouth daily.   omeprazole (PRILOSEC) 20 MG capsule Take 20 mg by mouth daily before breakfast.   PARoxetine (PAXIL) 40 MG tablet Take 60 mg by mouth at bedtime.    prazosin (MINIPRESS) 1 MG capsule Take 3 mg by mouth at bedtime.   predniSONE (DELTASONE) 20 MG tablet Take 40 mg by mouth daily with breakfast.   ranitidine (ZANTAC) 150 MG capsule Take 150 mg by mouth 2 (two) times daily.   simvastatin (ZOCOR) 20 MG tablet Take 10 mg by mouth at bedtime.   tamsulosin (FLOMAX) 0.4 MG CAPS capsule Take 0.8 mg by mouth every morning.   Allergies: Allergies as of 11/05/2019   (No  Known Allergies)   Past Medical History:  Diagnosis Date   Coronary artery disease    High cholesterol    Hypertension    Myocardial disease ([place])    Question of CAD   Prostate cancer ([place])    PTSD (post-traumatic stress disorder)    Family History:  Family History  Problem Relation Age of Onset   Prostate cancer Neg Hx    Social History:  Social History   Tobacco Use   Smoking status: Former Smoker    Types: Cigarettes   Smokeless tobacco: Never Used  Scientific laboratory technician Use: Never used  Substance Use Topics   Alcohol use: Yes    Comment: socially   Drug use: No   Review of Systems: A complete ROS was negative except as per HPI.  Physical Exam: Blood pressure 134/69, pulse 62, temperature 97.7 F (36.5 C), temperature source Axillary, resp. rate (!) 25, height 5\' 11"  (1.803 m), weight 86 kg, SpO2 100 %. Physical Exam Constitutional:      General: He is not in acute distress.    Appearance: Normal appearance.  Cardiovascular:     Rate and Rhythm: Normal rate and regular rhythm.     Pulses: Normal pulses.     Heart sounds: Normal heart sounds.  Pulmonary:     Effort: Pulmonary effort is normal. No respiratory distress.     Breath sounds: Wheezing (Mild bibasilar) present.  Abdominal:     General: Bowel sounds are normal. There is no distension.     Palpations: Abdomen is soft.     Tenderness: There is no abdominal tenderness.  Musculoskeletal:        General: No swelling or deformity.  Skin:    General: Skin is warm and dry.  Neurological:     General: No focal deficit present.     Mental Status: Mental status is at baseline.    EKG: personally reviewed my interpretation is  A-V paced rhythm with normal rate  CXR: personally reviewed my interpretation is good rotation, inhalation and penetration. No acute disease.   Assessment & Plan by Problem: Active Problems:   Acute on chronic respiratory failure with hypoxia (HCC)  Acute on chronic  respiratory Failure with hypoxia ?COPD vs Asthma Exacerbation: Presents after recent admission for the same and ED visit the day prior. Was due for hospital follow up today. [place], BNP not back (previously borderline, but euvolemic), CXR clear. On BiPAP when seen with good air movement, should be able to come off soon. He reports being on inhaler due to wheezing at PCP visit, no diagnosis, no PFTs. ~ 5 Pack year smoking history. S/P albuterol, Atrovent, Mg, and Solumedrol by EMS and Albuterol neb in ED. - Supplemental O2 (  Maintain sats 88-92%) - PRN BiPAP - Continue Doxycylcine - Prednisone 40mg  Daily - Duonebs q4h while awake for 1 day, then PRN - Albuterol PRN - Will need PFTs Outpatient and controller medication given recurrent episodes - F/U BNP and repeat hsTrop  Complete Heart Block: s/p Pacemaker in Feb 2021. Will monitor.  HTN: Home Lisinopril   PTSD: Continue Paxil Hx Prostate Cancer: Continue Tamsulosin and prazosin Chronic Pain: Continue home Norco (PDMP Appropriate) and Volatren HLD: Continue Statin GERD: Continue PPI  FEN: Regular diet VTE ppx: Lovenox Code Status: FULL   Dispo: Admit patient to Observation with expected length of stay less than 2 midnights.  Signed: Neva Seat, MD 11/05/2019, 10:42 AM  After 5pm on weekdays and 1pm on weekends: On Call pager: [phone]

## 2019-11-05 NOTE — ED Notes (Signed)
Pt resting in bed, family at the bedside. Respirations even and unlabored.

## 2019-11-05 NOTE — ED Triage Notes (Signed)
Pt arrives via ems from home in respiratory distress. En route pt given 15mg  albuterol, 1mg  atrovent, 125 solumedrol, 2g magnesium, arrives on CPAP at 98%. Denies CP, NV. Endorses both covid vaccines. Hx copd, dx with bronchitis yesterday. Compliant with meds. States he woke up, rolled over and became suddenly short of breath. Room air oxygen saturations were in the 60's.  147/76 HR 60's RR 40

## 2019-11-05 NOTE — ED Notes (Signed)
Pt currently n NAD.

## 2019-11-05 NOTE — Progress Notes (Signed)
10 mg CAT started through BiPAP at this time. Patient seems to be doing a bit better, still anxious. RT to monitor.
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
## 2019-11-06 ENCOUNTER — Encounter (HOSPITAL_COMMUNITY): Payer: Self-pay | Admitting: Internal Medicine

## 2019-11-06 ENCOUNTER — Observation Stay (HOSPITAL_COMMUNITY): Payer: No Typology Code available for payment source

## 2019-11-06 DIAGNOSIS — R55 Syncope and collapse: Secondary | ICD-10-CM | POA: Diagnosis not present

## 2019-11-06 DIAGNOSIS — J9601 Acute respiratory failure with hypoxia: Secondary | ICD-10-CM | POA: Diagnosis present

## 2019-11-06 DIAGNOSIS — I10 Essential (primary) hypertension: Secondary | ICD-10-CM | POA: Diagnosis not present

## 2019-11-06 DIAGNOSIS — E785 Hyperlipidemia, unspecified: Secondary | ICD-10-CM | POA: Diagnosis not present

## 2019-11-06 DIAGNOSIS — I442 Atrioventricular block, complete: Secondary | ICD-10-CM | POA: Diagnosis not present

## 2019-11-06 DIAGNOSIS — J441 Chronic obstructive pulmonary disease with (acute) exacerbation: Secondary | ICD-10-CM | POA: Diagnosis present

## 2019-11-06 DIAGNOSIS — F4310 Post-traumatic stress disorder, unspecified: Secondary | ICD-10-CM | POA: Diagnosis not present

## 2019-11-06 DIAGNOSIS — Z8546 Personal history of malignant neoplasm of prostate: Secondary | ICD-10-CM | POA: Diagnosis not present

## 2019-11-06 DIAGNOSIS — I2510 Atherosclerotic heart disease of native coronary artery without angina pectoris: Secondary | ICD-10-CM | POA: Diagnosis not present

## 2019-11-06 DIAGNOSIS — Z7982 Long term (current) use of aspirin: Secondary | ICD-10-CM | POA: Diagnosis not present

## 2019-11-06 DIAGNOSIS — R911 Solitary pulmonary nodule: Secondary | ICD-10-CM | POA: Diagnosis present

## 2019-11-06 DIAGNOSIS — J9621 Acute and chronic respiratory failure with hypoxia: Secondary | ICD-10-CM | POA: Diagnosis not present

## 2019-11-06 DIAGNOSIS — G8929 Other chronic pain: Secondary | ICD-10-CM | POA: Diagnosis not present

## 2019-11-06 DIAGNOSIS — Z20822 Contact with and (suspected) exposure to covid-19: Secondary | ICD-10-CM | POA: Diagnosis not present

## 2019-11-06 DIAGNOSIS — F17210 Nicotine dependence, cigarettes, uncomplicated: Secondary | ICD-10-CM | POA: Diagnosis not present

## 2019-11-06 DIAGNOSIS — R05 Cough: Secondary | ICD-10-CM

## 2019-11-06 DIAGNOSIS — Z79899 Other long term (current) drug therapy: Secondary | ICD-10-CM | POA: Diagnosis not present

## 2019-11-06 DIAGNOSIS — K219 Gastro-esophageal reflux disease without esophagitis: Secondary | ICD-10-CM | POA: Diagnosis present

## 2019-11-06 DIAGNOSIS — J438 Other emphysema: Secondary | ICD-10-CM | POA: Diagnosis not present

## 2019-11-06 DIAGNOSIS — Z950 Presence of cardiac pacemaker: Secondary | ICD-10-CM | POA: Diagnosis not present

## 2019-11-06 LAB — BASIC METABOLIC PANEL
Anion gap: 10 (ref 5–15)
BUN: 14 mg/dL (ref 8–23)
CO2: 24 mmol/L (ref 22–32)
Calcium: 9.1 mg/dL (ref 8.9–10.3)
Chloride: 103 mmol/L (ref 98–111)
Creatinine, Ser: 0.89 mg/dL (ref 0.61–1.24)
GFR calc Af Amer: >60 mL/min (ref >60)
GFR calc non Af Amer: >60 mL/min (ref >60)
Glucose, Bld: 151 mg/dL — ABNORMAL HIGH (ref 70–99)
Potassium: 4.4 mmol/L (ref 3.5–5.1)
Sodium: 137 mmol/L (ref 135–145)

## 2019-11-06 LAB — TROPONIN I (HIGH SENSITIVITY): Troponin I (High Sensitivity): 9 ng/L (ref <18)

## 2019-11-06 MED ORDER — PHENOL 1.4 % MT LIQD
1.0000 | OROMUCOSAL | Status: DC | PRN
  Administered 2019-11-06: 1 via OROMUCOSAL
  Filled 2019-11-06: qty 177

## 2019-11-06 MED ORDER — MENTHOL 3 MG MT LOZG
1.0000 | LOZENGE | OROMUCOSAL | Status: DC | PRN
  Administered 2019-11-06: 3 mg via ORAL
  Filled 2019-11-06: qty 9

## 2019-11-06 MED ORDER — UMECLIDINIUM BROMIDE 62.5 MCG/INH IN AEPB
1.0000 | INHALATION_SPRAY | Freq: Every day | RESPIRATORY_TRACT | Status: DC
  Administered 2019-11-07 – 2019-11-08 (×2): 1 via RESPIRATORY_TRACT
  Filled 2019-11-06: qty 7

## 2019-11-06 MED ORDER — CEPASTAT 14.5 MG MT LOZG: 1.0000 | LOZENGE | OROMUCOSAL | Status: DC | PRN

## 2019-11-06 NOTE — Progress Notes (Signed)
SATURATION QUALIFICATIONS: (This note is used to comply with regulatory documentation for home oxygen)  Patient Saturations on Room Air at Rest = 96%  Patient Saturations on Room Air while Ambulating = 95%  Patient Saturations on 2 Liters of oxygen while Ambulating = 98%  Please briefly explain why patient needs home oxygen: No issues while walking. Pt reports most challenging symptom currently is when he begins to cough & cannot stop. RN will continue to monitor.

## 2019-11-06 NOTE — Progress Notes (Signed)
Reevaluate patient this afternoon after CT scan.  Patient now hoarse from coughing.  Back on supplemental oxygen via nasal cannula reports that his oxygen dropped during coughing spell.  Peak flow was less than 200 with respiratory therapy.  Pulmonary has evaluated him agrees with likely COPD, CT scan also consistent with emphysema without other acute abnormalities.  Given that he is back on supplemental oxygen I will keep him overnight continue steroids.  I will add long-acting muscarinic agonist.  Continue doxycycline.  I had throat losange and spray.  Anticipate discharge tomorrow.
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
## 2019-11-06 NOTE — Plan of Care (Signed)
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
## 2019-11-06 NOTE — Progress Notes (Signed)
Peak flow before treatment was 190 and after treatment Peak flow went up to 235.

## 2019-11-06 NOTE — Discharge Summary (Signed)
Name: Glenn Brandt MRN: [id number] DOB: 01-Feb-1961 59 y.o. PCP: [facility]  Date of Admission: 11/05/2019  7:32 AM Date of Discharge: 11/08/2019 Attending Physician: Velna Ochs R  Discharge Diagnosis: 1. COPD exacerbation 2. Complete heart block s/p pace maker 3. HTN 4. GERD  Discharge Medications: Allergies as of 11/08/2019   No Known Allergies     Medication List    STOP taking these medications   doxycycline 100 MG capsule Commonly known as: VIBRAMYCIN Replaced by: doxycycline 100 MG tablet   lisinopril 5 MG tablet Commonly known as: ZESTRIL   omeprazole 20 MG capsule Commonly known as: PRILOSEC Replaced by: pantoprazole 40 MG tablet     TAKE these medications   acetaminophen 500 MG tablet Commonly known as: TYLENOL Take 500-1,000 mg by mouth every 6 (six) hours as needed for mild pain or headache.   albuterol 108 (90 Base) MCG/ACT inhaler Commonly known as: VENTOLIN HFA Inhale 2 puffs into the lungs 4 (four) times daily as needed for wheezing or shortness of breath.   aspirin 81 MG chewable tablet Chew 81 mg by mouth daily.   benzonatate 100 MG capsule Commonly known as: TESSALON Take 1 capsule (100 mg total) by mouth every 8 (eight) hours.   cetirizine 10 MG tablet Commonly known as: ZYRTEC Take 10 mg by mouth daily as needed (for seasonal allergies).   cyclobenzaprine 10 MG tablet Commonly known as: FLEXERIL Take 10 mg by mouth at bedtime.   diclofenac sodium 1 % Gel Commonly known as: VOLTAREN Apply 4 g topically See admin instructions. Apply 4 grams to right knee, right hip, and/or lower back up to four times daily as needed for pain   doxycycline 100 MG tablet Commonly known as: VIBRA-TABS Take 1 tablet (100 mg total) by mouth 2 (two) times daily for 2 days. Replaces: doxycycline 100 MG capsule   guaiFENesin 100 MG/5ML Soln Commonly known as: ROBITUSSIN Take 5 mLs (100 mg total) by mouth every 4 (four) hours as needed  for cough or to loosen phlegm.   HYDROcodone-acetaminophen 5-325 MG tablet Commonly known as: NORCO/VICODIN Take 1 tablet by mouth 3 (three) times daily as needed (for pain).   menthol-cetylpyridinium 3 MG lozenge Commonly known as: CEPACOL Take 1 lozenge (3 mg total) by mouth as needed for sore throat.   pantoprazole 40 MG tablet Commonly known as: PROTONIX Take 1 tablet (40 mg total) by mouth 2 (two) times daily. Replaces: omeprazole 20 MG capsule   PARoxetine 40 MG tablet Commonly known as: PAXIL Take 60 mg by mouth at bedtime.   phenol 1.4 % Liqd Commonly known as: CHLORASEPTIC Use as directed 1 spray in the mouth or throat as needed for throat irritation / pain.   prazosin 1 MG capsule Commonly known as: MINIPRESS Take 3 mg by mouth in the morning.   predniSONE 20 MG tablet Commonly known as: DELTASONE Take 2 tablets (40 mg total) by mouth daily with breakfast.   ranitidine 150 MG capsule Commonly known as: ZANTAC Take 150 mg by mouth 2 (two) times daily.   simvastatin 20 MG tablet Commonly known as: ZOCOR Take 10 mg by mouth at bedtime.   tamsulosin 0.4 MG Caps capsule Commonly known as: FLOMAX Take 0.8 mg by mouth every morning.   umeclidinium bromide 62.5 MCG/INH Aepb Commonly known as: INCRUSE ELLIPTA Inhale 1 puff into the lungs daily.       Disposition and follow-up:   Glenn Brandt was discharged from [facility] in Stable condition.  At the hospital follow up visit please address:  1.  COPD exacerbation: Discharged on prednisone, doxycycline, incruse, and duonebs.  - Please ensure he finishes his steroid taper as prescribed - Please make sure he gets outpatient PFTs performed - Please make sure he follow up with Pulmonology  Chronic cough with syncopal episodes: Likely vasovagal. However also adjusted protonix and stopped his lisinopril (was on 5 mg daily) to assess other causes.  2.  Labs / imaging needed at time of  follow-up: bmp  3.  Pending labs/ test needing follow-up: N/A  Follow-up Appointments:  Follow-up [place]. Schedule an appointment as soon as possible for a visit.   Specialty: General Practice Why: Please contact to make appointment in 1 week Contact information: [address] [place] [address] [phone]               Hospital Course by problem list: 1. COPD exacerbation: Mr.Sides is a 59 yo M w/ PMH of complete heart block s/p pacemaker, HTN and presumed COPD vs asthma presenting to [facility] w/ complaint of dyspnea and wheezing without obvious inciting events. He was examined in the ED and found to be in acute hypoxic respiratory failure. He was placed on Bipap and started on IV steroids and nebulizer treatments. He was also continued on doxycycline which was prescribed for him at prior ED visit. On hospital day 1, he had significant improvement after COPD treatment. Was started on prednisone, duonebs, and doxycyline. Symptom severity and oxygenation both improved by 6/29 as he transitioned to 4L of oxygen on nasal cannula. Respiratory therapy performed peak flow testing which was low at 200. A CT chest was obtained and was consistent with emphysema. Pulmonology consultation on 6/29 agreed that most likely diagnosis was COPD. A long-acting muscarinic antagonist (Incruse) was added for further symptom control. By 6/30 patient initially reported feeling much improved and had transitioned to oxygenating well on room air at 97%. However, by the afternoon reported additional coughing spells with episodes of "blacking out". Pulmonology evaluated and felt patient needed additional inpatient treatment and monitoring. Respiratory status continued to improve on 7/1 and coughing spells decreased in frequency. Pulmonology and Internal Medicine were in agreement that he was stable for discharge and counseled him to follow-up with his outpatient pulmonologist for formal  pulmonary function testing.   Chronic cough w/ dizziness and syncopal episodes: Patient has a chronic cough with associated dizziness and syncopal episodes following prolonged coughing spells. Cough previously had not improved with tessalon perles or robitussin in the outpatient setting. Coughing spells and syncopal episodes decreased in frequency throughout admission with use of phenol spray reportedly helping according to patient. These events did not fully subside however, and by 7/1 reported one episode of prolonged cough followed by syncope. His lisinopril was stopped to rule-out an ACE inhibitor cough and his Protonix was increased upon discharged to rule-out a GERD-induced cough. Cardiology was also consulted on 7/1 to interrogate his dual-pacemaker. No pacer events occurred over the interrogated time. His presentation was most consistent with vasovagal syncope and patient was educated on syncope precautions prior to discharge.   Complete Heart Block: s/p dual-Pacemaker in Feb 2021. Cariology was consulted on 7/1 to interrogate pacemaker for syncope workup. No pacer events occurred over the interrogated time.  CAD: Initially presented with left arm numbness on admission. Denied chest pain, diaphoresis, nausea vomiting. Negative EKG and had a slightly elevated troponin of 29 which improved  to 9 on repeat check. Arm numbness subsided by 6/29. Patient continued on home medications with aspirin, simvastatin, and lisinopril.  Discharge Vitals:   BP 123/79 (BP Location: Left Arm)   Pulse 100   Temp 98.2 F (36.8 C)   Resp 18   Ht 5\' 11"  (1.803 m)   Wt 86.6 kg   SpO2 93%   BMI 26.63 kg/m   Pertinent Labs, Studies, and Procedures:  CBC Latest Ref Rng & Units 11/05/2019 11/05/2019 11/04/2019  WBC 4.0 - 10.5 K/uL 7.8 12.7(H) 7.2  Hemoglobin 13.0 - 17.0 g/dL 13.3 13.9 13.9  Hematocrit 39 - 52 % 41.0 44.1 43.5  Platelets 150 - 400 K/uL 271 269 266   BMP Latest Ref Rng & Units 11/06/2019 11/05/2019  11/04/2019  Glucose 70 - 99 mg/dL 151(H) 140(H) 116(H)  BUN 8 - 23 mg/dL 14 12 9   Creatinine 0.61 - 1.24 mg/dL 0.89 0.92 0.97  Sodium 135 - 145 mmol/L 137 140 139  Potassium 3.5 - 5.1 mmol/L 4.4 4.2 3.9  Chloride 98 - 111 mmol/L 103 106 104  CO2 22 - 32 mmol/L 24 24 27   Calcium 8.9 - 10.3 mg/dL 9.1 9.0 9.3   PORTABLE CHEST 1 VIEW  FINDINGS: The heart size and mediastinal contours are within normal limits. Both lungs are clear. No pneumothorax or pleural effusion is noted. Left-sided pacemaker is unchanged in position. The visualized skeletal structures are unremarkable.  IMPRESSION: No active disease.  11/06/19 CT chest: IMPRESSION: 1.  No acute intrathoracic process. 2. COPD.  Emphysema (ICD10-J43.9).   Discharge Instructions: Discharge Instructions    Call MD for:  difficulty breathing, headache or visual disturbances   Complete by: As directed    Call MD for:  extreme fatigue   Complete by: As directed    Call MD for:  hives   Complete by: As directed    Call MD for:  persistant dizziness or light-headedness   Complete by: As directed    Call MD for:  persistant nausea and vomiting   Complete by: As directed    Call MD for:  redness, tenderness, or signs of infection (pain, swelling, redness, odor or green/yellow discharge around incision site)   Complete by: As directed    Call MD for:  severe uncontrolled pain   Complete by: As directed    Call MD for:  temperature >100.4   Complete by: As directed    Diet - low sodium heart healthy   Complete by: As directed    Increase activity slowly   Complete by: As directed       Signed: Asencion Noble, MD 11/09/2019, 1:55 PM   Pager: @[phone] @

## 2019-11-07 MED ORDER — BENZONATATE 100 MG PO CAPS: 200.0000 mg | ORAL_CAPSULE | Freq: Three times a day (TID) | ORAL | Status: DC

## 2019-11-07 MED ORDER — ALBUTEROL SULFATE (2.5 MG/3ML) 0.083% IN NEBU
2.5000 mg | INHALATION_SOLUTION | Freq: Two times a day (BID) | RESPIRATORY_TRACT | Status: DC
  Administered 2019-11-07 – 2019-11-08 (×2): 2.5 mg via RESPIRATORY_TRACT
  Filled 2019-11-07 (×2): qty 3

## 2019-11-07 NOTE — Progress Notes (Addendum)
Subjective: Glenn Brandt is a 59 yo male with a history of complete heart block (s/p pacemaker in Feb 2021), HTN, prostate cancer, and mild CAD who presented to [facility] with 2 days of SOB.   Patient seen at bedside this morning. States he is feeling much better compared to yesterday and if encouraged that he has been weaned of oxygen. Notes he had one coughing spell that lasted for a few minutes, but otherwise has felt improved with cough spray and inhaler regimen. Patient states that he is feeling well enough to go home and hopes that a new medication regimen will keep him from returning to the hospital with shortness of breath. Voices understanding that he will follow up with an outpatient pulmonologist for pulmonary function testing.   Interval history: Patient was reevaluated in the afternoon after reporting to Pulmonology of continued coughing spells causing him to feel dizzy and "black out".   Objective:  Vital signs in last 24 hours: Vitals:   11/06/19 2323 11/07/19 0500 11/07/19 0725 11/07/19 0954  BP: 132/87   136/79  Pulse: 90   86  Resp:    19  Temp: 98.1 F (36.7 C)   98.5 F (36.9 C)  TempSrc: Oral     SpO2: 98%  97% 97%  Weight:  86.9 kg    Height:       Weight change: 0.9 kg No intake or output data in the 24 hours ending 11/07/19 1003  Physical exam Constitutional: Sitting up in bed in no acute distress.  Cardiovascular: RRR, no gallops, rubs, murmurs. No carotid bruits. Normal capillary refill ~2 seconds. Respiratory: Breathing comfortably on room air at 97% oxygenation. Breath sounds CTA B. GI/Abdomen: Bowel sounds are normal. No distension. Abdomen is soft and nontender Skin: Skin is warm and dry Neurologic: No focal deficits. Mental status is at baseline.     Assessment/Plan:  Active Problems:   Acute on chronic respiratory failure with hypoxia (HCC)   Acute respiratory failure with hypoxia [facility])  Glenn Brandt is a 59 yo male with a history of complete heart  block (s/p pacemaker in Feb 2021), HTN, prostate cancer, and mild CAD who presented to [facility] with recurring SOB following a brief course of steroids, with presentation most concerning for COPD versus asthma exacerbation.   Acute on chronic obstructive pulmonary disease: While acute symptom onset is atypical, leading diagnosis remains COPD with an acute exacerbation. Peak flow testing as low as 200 hundred yesterday, concerning for an obstructive process; now improved to 350 today. CT chest c/w emphysema also supportive for COPD. Pulmonology agree with this diagnosis and current treatment plan of steroids, LAMA, duonebs, and doxycyline. Overall, respiratory status has improved during admission, successfully transitioning from BiPAP to nasal cannula to room air with appropriate oxygenation, however he continues to have intermittent coughing spells with dyspnea though they seem to be decreasing in frequency. Will continue current treatment plan and monitor for symptomatic improvement.  - Appreciate additional pulmonology recommendations - COPD inpatient treatment plan:    - Continue Prednisone 40mg  daily    - Continue Umeclidinium bromide once daily    - Continue doxycycline course    - Duonebs PRN     - Start tessalon perles PRN for cough, per Pulmonology - Will need PFTs outpatient for formal diagnosis and continued observation  CAD: Troponin now improved down to 9 from 29. Does not endorse any chest pain and left arm numbness has improved.   Complete Heart Block: s/p Pacemaker in  Feb 2021.   HTN: Continue home Lisinopril   PTSD: Continue Paxil Hx Prostate Cancer: Continue Tamsulosin and prazosin Chronic Pain: Continue home Norco (PDMP Appropriate) and Volatren HLD: Continue Statin GERD: Continue PPI  FEN: Regular diet VTE ppx: Lovenox Code Status: FULL   Dispo: Progressively improving however not yet stable for discharge. Will continue treatment plan and estimate discharge in 1 day  pending continued symptomatic improvement and Pulmonology recommendations.    LOS: 1 day   Trina Ao, Medical Student 11/07/2019, 10:03 AM  Pager: [phone]

## 2019-11-07 NOTE — Progress Notes (Signed)
Brief Progress Note  Seen in follow up for AE obstructive lung disease, chart review suggests patient to be discharged today 11/07/19. In conversation with patient 11/07/19 11:45, he describes 2 events on 6/30 during which his coughing has caused him to feel progressively dizzy and to "black out." He tells me he is unsure if he is ready to go home today.  I have paged primary team to discuss with them.   Full prog note to follow    Eliseo Gum MSN, AGACNP-BC [place] [phone] If no answer, [phone] 11/07/2019, 12:01 PM

## 2019-11-07 NOTE — Progress Notes (Addendum)
NAME:  Glenn Brandt, MRN:  [id number], DOB:  1960/06/28, LOS: 1 ADMISSION DATE:  11/05/2019, CONSULTATION DATE: 11/06/2019 REFERRING MD: Dr. Heber [place], CHIEF COMPLAINT: Cough, shortness of breath  Brief History   4 weeks of cough, shortness of breath No preceding bronchitic infection No diagnosed underlying lung disease  History of present illness   Patient noticed a cough about 4 weeks ago which progressively got worse Associated shortness of breath Sometimes bringing up grayish phlegm Did not feel acutely ill He had presented to the hospital 10/29/2019 treated for lower respiratory tract infection with Tessalon and antibiotics, came back to the hospital with nonresolution of symptoms No exposure to anybody with a febrile illness No diagnosed underlying lung disease Social smoker about a cigarette or 2 a day 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 Coronary artery disease   . High cholesterol   . Hypertension   . 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 Prostate cancer ([place])   . PTSD (post-traumatic stress disorder)      Significant Hospital Events   6/29 PCCM consulted  Consults:  pccm  Procedures:  None  Significant Diagnostic Tests:  Chest x-ray shows no acute infiltrate, flattening of the diaphragm, prominent bronchovascular markings on the right  CT chest noncon> L chest wall pacemaker. Mild peribronchial thickening. Mild paraseptal emphysema. RUL LLL scarring.  No focal consolidation, pleural effusion, ptx. Some areas of possible mucus impaction  Micro Data:    Antimicrobials:  Doxycycline 6/28>>  Interim history/subjective:  Multiple episodes today of severe coughing, causing the patient to feel dizzy and "blacking out."  Feels like "the inhalers are the helpful thing"  Objective   Blood pressure 136/79, pulse 86, temperature 98.5 F (36.9 C), resp. rate 19, height 5\' 11"  (1.803 m), weight 86.9 kg, SpO2 97 %.       No intake or output data in the 24 hours ending 11/07/19 1213 Filed Weights   11/05/19 0736 11/07/19 0500  Weight: 86 kg 86.9 kg    Examination: General: WDWN middle aged M, reclined in bed NAD on RA  HENT: NCAT pink mmm trachea midline patent nares  Lungs: LLL wheeze. Symmetrical chest expansion, no accessory use on RA Cardiovascular: RRR s1s2 no rgm  Cap refill brisk Abdomen: soft round ndnt   Resolved Hospital Problem list     Assessment & Plan:   Acute respiratory failure with hypoxia, improved Acute exacerbation of obstructive lung disease -Hypoxia has improved, symptoms are largely improving however pt having severe coughing episodes with near syncope  -Likely has underlying obstructive lung disease with his peak flows only in the 200s. Pt endorses work exposure to various ammunitions chemicals in [employer] -would recommend continuing inpatient care until above episodes have improved  -Continue doxy, continue prednisone and protonix -continue incruse ellipta, albuterol  -Robitussin, chloreseptic spray, mentol lozenges have been ordered for cough -- He does not seem to have a sore throat. I wonder if he might have more relief from tessalon  pearls.  -IS, mobility  -Will need to see pulm at [facility] for PFTs and further management   History of heart block - cardiac monitoring History of prostate cancer - flomax for supportive care PTSD - minipress, paxil  History of chronic pain - flexeril, diclofenac gel  PRN    Eliseo Gum MSN, AGACNP-BC [place] [phone] If no answer, [phone] 11/07/2019, 12:25 PM  Patient seen, independently examined Plan of care was formulated and discussed with Eliseo Gum as per notes  Continues to cough, nearly passing out twice this morning He stated that benzonatate was prescribed in the past and this appeared to make the cough worse  History of heart block History of prostate cancer PTSD History of chronic pain  CT scan reviewed by myself showing paraseptal emphysema, some groundglass change in the right upper lobe  Will suggest to continue bronchodilator treatments Continue steroids  I do not believe he is ready for discharge at present  Continue lines of care   Sherrilyn Rist, MD [place] PCCM Pager: [phone]

## 2019-11-07 NOTE — Plan of Care (Signed)

## 2019-11-07 NOTE — Hospital Course (Addendum)
Glenn Brandt is a 59 yo male with a history of complete heart block (s/p pacemaker in Feb 2021), HTN, prostate cancer, and mild CAD who presented to [facility] on 6/28 with recurrent SOB.    Acute on chronic obstructive pulmonary disease: Had recently been admitted from 6/21-22 for shortness of breath and coughing spells which were controlled with a short course of prednisone. Symptoms returned after finishing steroid course, and patient returned to the ED on 6/28 complaining of a 2 day history of shortness of breath and additional coughing spells for which he was admitted for symptom management and diagnostic workup. Was initially placed on BiPAP in the ED for hypoxia in the 60s%. Was started on prednisone, duonebs, and doxycyline. Symptom severity and oxygenation both improved by 6/29 as he transitioned to 4L of oxygen on nasal cannula. Respiratory therapy performed peak flow testing which was low at 200. A CT chest was obtained and was consistent with emphysema. Pulmonology consultation on 6/29 agreed that most likely diagnosis was COPD and approved his treatment plan. A long-acting muscarinic antagonist (Incruse) was added for further symptom control. By 6/30 patient initially reported feeling much improved and had transitioned to oxygenating well on room air at 97%. However, by the afternoon reported additional coughing spells. Pulmonology evaluated and felt patient needed additional inpatient treatment and monitoring. Respiratory status continued to improve on 7/1 and coughing spells decreased in frequency. Pulmonology and Internal Medicine were in agreement that he was stable for discharge and counseled him to follow-up with his outpatient pulmonologist for formal pulmonary function testing.   Chronic cough w/ dizziness and syncopal episodes: Patient has a chronic cough with associated dizziness and syncopal episodes following prolonged coughing spells. Cough previously had not improved with tessalon perles or  robitussin in the outpatient setting. Coughing spells and syncopal episodes decreased in frequency throughout admission with use of phenol spray reportedly helping according to patient. These events did not fully subside however, and by 7/1 reported one episode of prolonged cough followed by syncope. His lisinopril was stopped to rule-out an ACE inhibitor cough and his Protonix was increased upon discharged to rule-out a GERD-induced cough. Cardiology was also consulted on 7/1 to interrogate his dual-pacemaker. No pacer events occurred over the interrogated time. His presentation was most consistent with vasovagal syncope and patient was educated on syncope precautions prior to discharge.   Complete Heart Block: s/p dual-Pacemaker in Feb 2021. Cariology was consulted on 7/1 to interrogate pacemaker for syncope workup. No pacer events occurred over the interrogated time.  CAD: Initially presented with left arm numbness on admission. Denied chest pain, diaphoresis, nausea vomiting. Negative EKG and had a slightly elevated troponin of 29 which improved to 9 on repeat check. Arm numbness subsided by 6/29. Patient continued on home medications with aspirin, simvastatin, and lisinopril.

## 2019-11-08 DIAGNOSIS — J9621 Acute and chronic respiratory failure with hypoxia: Secondary | ICD-10-CM

## 2019-11-08 DIAGNOSIS — J441 Chronic obstructive pulmonary disease with (acute) exacerbation: Secondary | ICD-10-CM

## 2019-11-08 MED ORDER — MENTHOL 3 MG MT LOZG: 1.0000 | LOZENGE | OROMUCOSAL | 12 refills | Status: DC | PRN

## 2019-11-08 MED ORDER — UMECLIDINIUM BROMIDE 62.5 MCG/INH IN AEPB: 1.0000 | INHALATION_SPRAY | Freq: Every day | RESPIRATORY_TRACT | 0 refills | Status: DC

## 2019-11-08 MED ORDER — PREDNISONE 20 MG PO TABS: 40.0000 mg | ORAL_TABLET | Freq: Every day | ORAL | 0 refills | Status: DC

## 2019-11-08 MED ORDER — DOXYCYCLINE HYCLATE 100 MG PO TABS: 100.0000 mg | ORAL_TABLET | Freq: Two times a day (BID) | ORAL | 0 refills | Status: AC

## 2019-11-08 MED ORDER — PANTOPRAZOLE SODIUM 40 MG PO TBEC: 40.0000 mg | DELAYED_RELEASE_TABLET | Freq: Two times a day (BID) | ORAL | Status: DC

## 2019-11-08 MED ORDER — PANTOPRAZOLE SODIUM 40 MG PO TBEC: 40.0000 mg | DELAYED_RELEASE_TABLET | Freq: Two times a day (BID) | ORAL | 0 refills | Status: DC

## 2019-11-08 MED ORDER — PHENOL 1.4 % MT LIQD: 1.0000 | OROMUCOSAL | 0 refills | Status: DC | PRN

## 2019-11-08 NOTE — Progress Notes (Addendum)
Subjective: Glenn Brandt is a 59 yo male with a history of complete heart block (s/p dual-pacemaker in Feb 2021), HTN, prostate cancer, and mild CAD who presented with SOB likely secondary to a COPD exacerbation, as well as chronic cough with associated dizziness and syncope.  Patient seen at bedside this morning. States he is continuing to feel progressively better, reporting no dyspnea and only 1 coughing spell since yesterday. He remains on room air and has not required his rescue inhaler. His coughing spell with associated "blackouts" was consistent with prior episodes, which he describes occur after of a prolonged cough, followed by an "odd" sensation in his head, before "seeing black" for 2-3 seconds. He is unsure if he loses consciousness, but denies shaking movements, incontinence, or post-episode confusion. The "blackout" spells self-resolve and patient states he feels completely normal afterwards.   Overall he states he feels safe enough to go home but states he worries about his shortness of breath returning and having to come back to the hospital. Patient voiced understanding about the chronic process of his lung disease and the likelihood that his respiratory symptoms would not completely resolve. He also voiced understanding about syncope precautions, stating that he plans to avoid driving as much as possible, and will pull over if he is driving and starts to experience a coughing spell or oncoming syncope.  Objective:  Vital signs in last 24 hours: Vitals:   11/08/19 0500 11/08/19 0742 11/08/19 0806 11/08/19 0807  BP:  125/89    Pulse:  80    Resp:  18    Temp:  98.6 F (37 C)    TempSrc:      SpO2:  96% 95% 95%  Weight: 86.6 kg     Height:       Weight change: -0.3 kg  Intake/Output Summary (Last 24 hours) at 11/08/2019 1326 Last data filed at 11/07/2019 2100 Gross per 24 hour  Intake 240 ml  Output --  Net 240 ml    Physical exam Constitutional: Sitting up in bed in no  acute distress.  Cardiovascular: RRR, no gallops, rubs, murmurs. No carotid bruits. Normal capillary refill ~2 seconds. Respiratory: Breathing comfortably on room air at 97% oxygenation. Breath sounds CTA B. GI/Abdomen: Bowel sounds are normal. No distension. Abdomen is soft and nontender Skin: Skin is warm and dry Neurologic: No focal deficits. Mental status is at baseline.     Assessment/Plan:  Active Problems:   Acute on chronic respiratory failure with hypoxia (HCC)   Acute respiratory failure with hypoxia [facility])  Glenn Brandt is a 59 yo male with a history of complete heart block (s/p dual-pacemaker in Feb 2021), HTN, prostate cancer, and mild CAD who presented with progressively worsening SOB most concerning for a COPD exacerbation, as well as chronic cough with associated spells of dizziness and syncope.  Acute on chronic obstructive pulmonary disease: While acute symptom onset is atypical (no prior dyspnea until a month ago), leading diagnosis remains COPD with an acute exacerbation. CT chest c/w emphysema and Pulmonology agree with this diagnosis and current treatment plan of steroids, LAMA, duonebs, and doxycyline. Overall, respiratory status has improved throughout admission, successfully transitioning from BiPAP to nasal cannula to room air, and he continues to oxygenate appropriately. His chronic cough is still present but spells are decreasing in frequency. Overall, Pulmonology agree that he is stable for discharge with close outpatient follow-up for formal PFTs and medication management.   - COPD outpatient treatment plan:    -  Complete course of Prednisone 40mg  daily    - Complete course of doxycycline    - Umeclidinium bromide once daily    - Duonebs PRN     - Throat spray for cough PRN - Discharge with close outpatient Pulmonology f/u for PFTs and medication optimization.   Chronic cough w/ dizziness and syncopal episodes: Etiology of cough most likely secondary to COPD versus  a viral bronchitis that is slowly resolving. His low-dose lisinopril was also discontinued and we will increase his home Protonix for possible GERD-induced cough. Cough frequency has improved during admission with PRN throat spray, however have not completely subsided and he continues to experience syncope after prolonged coughing spells. There appears to be an aura prior to syncope which is most consistent with vasovagal episodes, however given his history of heart block, Cardiology has been consulted to interrogate his dual-pacemaker.  - Increase home Protonix to 40 mg, per Pulmonology recommendations - Awaiting pacemaker interrogation by Cardiology, if normal can discharge today. - Provided safety precautions related to syncope and patient voiced understanding.  Complete Heart Block: s/p dual- Pacemaker in Feb 2021. Given syncopal episodes Cardiology was consulted to interrogate pacemaker before proceeding with discharge. - Awaiting pacemaker interrogation as above.   HTN: Normotensive throughout admission.  - Stopped Lisinopril 5 mg to r/o ACEi-induced cough.  GERD: Denies reflux, but endorses a chronic cough.  - Increase Protonix as above.  CAD: Troponin improved down to 9 from 29 on 6/30. Continues to deny chest pain or diaphoresis, and left arm numbness remains resolved.  PTSD: Continue Paxil Hx Prostate Cancer: Continue Tamsulosin and prazosin Chronic Pain: Continue home Norco (PDMP Appropriate) and Volatren HLD: Continue Statin  FEN: Regular diet VTE ppx: Lovenox Code Status: FULL   Dispo: Can discharge home today after respiratory status has progressively improved and coughing spells decreased in frequency.    LOS: 2 days   Trina Ao, Medical Student 11/08/2019, 1:26 PM  Pager: [phone]

## 2019-11-08 NOTE — Progress Notes (Addendum)
NAME:  Glenn Brandt, MRN:  [id number], DOB:  Nov 26, 1960, LOS: 2 ADMISSION DATE:  11/05/2019, CONSULTATION DATE: 11/06/2019 REFERRING MD: Dr. Heber , CHIEF COMPLAINT: Cough, shortness of breath  Brief History   4 weeks of cough, shortness of breath No preceding bronchitic infection No diagnosed underlying lung disease  History of present illness   Patient noticed a cough about 4 weeks ago which progressively got worse Associated shortness of breath Sometimes bringing up grayish phlegm Did not feel acutely ill He had presented to the hospital 10/29/2019 treated for lower respiratory tract infection with Tessalon and antibiotics, came back to the hospital with nonresolution of symptoms No exposure to anybody with a febrile illness No diagnosed underlying lung disease Social smoker about a cigarette or 2 a day 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 [employer]  He had had a CT scan performed a couple years ago, showed a small lung nodule-does not recollect being described as something to be worried about He also did have some wheezing in the past for which he was started on an inhaler  History of hypertension, prostate cancer, coronary artery disease-recent cath did not reveal significant abnormalities, history of complete heart block s/p pacemaker placement February 2021  Past Medical History   Past Medical History:  Diagnosis Date   Coronary artery disease    High cholesterol    Hypertension    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 Mild peribronchial thickening. Mild  paraseptal emphysema. RUL LLL scarring. No focal consolidation, pleural effusion, ptx. Some areas of possible mucus impaction   11/08/2019 consideration for pacemaker interrogation Micro Data:    Antimicrobials:  Doxycycline 6/28>>  Interim history/subjective:  Reports one episode of blacking out following coughing Objective   Blood pressure 125/89, pulse 80, temperature 98.6 F (37 C), resp. rate 18, height 5\' 11"  (1.803 m), weight 86.6 kg, SpO2 95 %.        Intake/Output Summary (Last 24 hours) at 11/08/2019 1046 Last data filed at 11/07/2019 2100 Gross per 24 hour  Intake 240 ml  Output --  Net 240 ml   Filed Weights   11/05/19 0736 11/07/19 0500 11/08/19 0500  Weight: 86 kg 86.9 kg 86.6 kg    Examination: Well-nourished well-developed male no acute distress at rest.  Reports have an episode of coughing leading to syncope he is aware of these episodes consciousness returns with tingling in the arm and neck and incomplete recovery HEENT no JVD or lymphadenopathy is appreciated Cardiac heart sounds are regular noted to be atrially paced at times Coarse rhonchi bilaterally no expiratory wheezes noted Abdomen soft nontender positive bowel sounds Extremities warm and dry Neurologically grossly intact without focal defect  Resolved Hospital Problem list     Assessment & Plan:   Acute respiratory failure with hypoxia, improved Acute exacerbation of obstructive lung disease -Hypoxia has improved, symptoms are largely improving however pt having severe coughing episodes with near syncope  -Likely has underlying obstructive lung disease with his peak flows only in the 200s. Pt endorses work exposure to various ammunitions chemicals in [employer] service  New cough syncope in  individual with dual chambered pacemaker P Continue pulmonary treatments Cough suppressants not very effective. continue doxy/prednisone/ppi/incruse/ellipta/albuter  Consider having pacemaker interrogated.  Followed by [place] EP. May have a cardiac rvent leading reflex coughing to raise bp. May need neuro eval at later date.. All other issues can be worked up at [facility].   All other issues per primary      Richardson Landry Auriah Hollings ACNP Acute Care Nurse Practitioner [place] Please consult Amion 11/08/2019, 10:47 AM

## 2019-11-08 NOTE — Progress Notes (Signed)
New order to discharge patient home.  PIV and cardiac monitoring discontinued.  AVS printed and reviewed with patient.  All questions answered.  Prescriptions sent to [place] by provider.  Wife will transport patient home in personal vehicle.  Patient in agreement with discharge.

## 2019-11-08 NOTE — Discharge Instructions (Signed)
Glenn Brandt, you were admitted for your shortness of breath and coughing spells which lowered your oxygen intake. We treated your breathing symptoms with steroids, an antibiotic, and inhalers which allowed you to transition from requiring additional oxygen support to being able to breath on your own without assistance. The throat spray also helped in decreasing your coughing spells. Your symptoms and what we can see on the CT scan of your chest are most consistent with the diagnosis of chronic obstructive pulmonary disease (COPD), however you will need formal pulmonary function testing to confirm this diagnosis. Please reach out to your pulmonologist at the [facility] for a follow-up appointment to complete this testing and to review your medications. In the meantime, continue to take the Incruse daily and finish your doxycycline antibiotic course. We will also start you on another short course of steroids with Prednisone. You can still use your Albuterol if you have any additional coughing spells that lead to difficulty breathing.   Please hold your lisinopril for now, this is another possible cause of your cough and your blood pressure has been stable while you have been here. Follow up with your primary care provider to see if this needs to be restarted.   We started you on a higher dose of the protonix in case you are having uncontrolled GERD.   It was a pleasure getting to work with you and I wish you the very best in the future. Thank you for allowing the Internal Medicine Teaching Service to provide your medical care during this hospital stay.

## 2019-11-16 ENCOUNTER — Emergency Department (HOSPITAL_COMMUNITY)
Admission: EM | Admit: 2019-11-16 | Discharge: 2019-11-16 | Disposition: A | Discharge status: Home / Self Care | Payer: No Typology Code available for payment source | Attending: Emergency Medicine | Admitting: Emergency Medicine

## 2019-11-16 ENCOUNTER — Emergency Department (HOSPITAL_COMMUNITY): Payer: No Typology Code available for payment source

## 2019-11-16 ENCOUNTER — Encounter (HOSPITAL_COMMUNITY): Payer: Self-pay | Admitting: Emergency Medicine

## 2019-11-16 ENCOUNTER — Other Ambulatory Visit: Payer: Self-pay

## 2019-11-16 DIAGNOSIS — I10 Essential (primary) hypertension: Secondary | ICD-10-CM | POA: Diagnosis not present

## 2019-11-16 DIAGNOSIS — R0789 Other chest pain: Secondary | ICD-10-CM | POA: Diagnosis not present

## 2019-11-16 DIAGNOSIS — I252 Old myocardial infarction: Secondary | ICD-10-CM | POA: Diagnosis not present

## 2019-11-16 DIAGNOSIS — Z7982 Long term (current) use of aspirin: Secondary | ICD-10-CM | POA: Diagnosis not present

## 2019-11-16 DIAGNOSIS — Z79899 Other long term (current) drug therapy: Secondary | ICD-10-CM | POA: Diagnosis not present

## 2019-11-16 DIAGNOSIS — R079 Chest pain, unspecified: Secondary | ICD-10-CM

## 2019-11-16 DIAGNOSIS — Z87891 Personal history of nicotine dependence: Secondary | ICD-10-CM | POA: Diagnosis not present

## 2019-11-16 DIAGNOSIS — I2510 Atherosclerotic heart disease of native coronary artery without angina pectoris: Secondary | ICD-10-CM | POA: Insufficient documentation

## 2019-11-16 DIAGNOSIS — R0602 Shortness of breath: Secondary | ICD-10-CM | POA: Insufficient documentation

## 2019-11-16 DIAGNOSIS — Z950 Presence of cardiac pacemaker: Secondary | ICD-10-CM | POA: Insufficient documentation

## 2019-11-16 LAB — CBC
HCT: 42.1 % (ref 39.0–52.0)
Hemoglobin: 13.7 g/dL (ref 13.0–17.0)
MCH: 28.2 pg (ref 26.0–34.0)
MCHC: 32.5 g/dL (ref 30.0–36.0)
MCV: 86.6 fL (ref 80.0–100.0)
Platelets: 226 10*3/uL (ref 150–400)
RBC: 4.86 MIL/uL (ref 4.22–5.81)
RDW: 13.7 % (ref 11.5–15.5)
WBC: 11.1 10*3/uL — ABNORMAL HIGH (ref 4.0–10.5)
nRBC: 0 % (ref 0.0–0.2)

## 2019-11-16 LAB — BASIC METABOLIC PANEL
Anion gap: 8 (ref 5–15)
BUN: 8 mg/dL (ref 8–23)
CO2: 25 mmol/L (ref 22–32)
Calcium: 9 mg/dL (ref 8.9–10.3)
Chloride: 105 mmol/L (ref 98–111)
Creatinine, Ser: 1.03 mg/dL (ref 0.61–1.24)
GFR calc Af Amer: >60 mL/min (ref >60)
GFR calc non Af Amer: >60 mL/min (ref >60)
Glucose, Bld: 157 mg/dL — ABNORMAL HIGH (ref 70–99)
Potassium: 4.3 mmol/L (ref 3.5–5.1)
Sodium: 138 mmol/L (ref 135–145)

## 2019-11-16 LAB — BRAIN NATRIURETIC PEPTIDE: B Natriuretic Peptide: 173.6 pg/mL — ABNORMAL HIGH (ref 0.0–100.0)

## 2019-11-16 LAB — PROTIME-INR
INR: 1 (ref 0.8–1.2)
Prothrombin Time: 12.3 seconds (ref 11.4–15.2)

## 2019-11-16 LAB — TROPONIN I (HIGH SENSITIVITY)
Troponin I (High Sensitivity): 9 ng/L (ref <18)
Troponin I (High Sensitivity): 10 ng/L (ref <18)
Troponin I (High Sensitivity): 12 ng/L (ref <18)

## 2019-11-16 MED ORDER — KETOROLAC TROMETHAMINE 30 MG/ML IJ SOLN
15.0000 mg | Freq: Once | INTRAMUSCULAR | Status: AC
  Administered 2019-11-16: 15 mg via INTRAVENOUS
  Filled 2019-11-16: qty 1

## 2019-11-16 MED ORDER — FENTANYL CITRATE (PF) 100 MCG/2ML IJ SOLN
50.0000 ug | Freq: Once | INTRAMUSCULAR | Status: AC
  Administered 2019-11-16: 50 ug via INTRAVENOUS
  Filled 2019-11-16: qty 2

## 2019-11-16 MED ORDER — HYDROMORPHONE HCL 1 MG/ML IJ SOLN
1.0000 mg | Freq: Once | INTRAMUSCULAR | Status: AC
  Administered 2019-11-16: 1 mg via INTRAVENOUS
  Filled 2019-11-16: qty 1

## 2019-11-16 MED ORDER — IOHEXOL 350 MG/ML SOLN
100.0000 mL | Freq: Once | INTRAVENOUS | Status: AC | PRN
  Administered 2019-11-16: 100 mL via INTRAVENOUS

## 2019-11-16 MED ORDER — SODIUM CHLORIDE 0.9% FLUSH
3.0000 mL | Freq: Once | INTRAVENOUS | Status: AC
  Administered 2019-11-16: 3 mL via INTRAVENOUS

## 2019-11-16 NOTE — ED Notes (Signed)
Pt pacemaker was interrogated

## 2019-11-16 NOTE — Discharge Instructions (Addendum)
As discussed, all of your labs were reassuring today. You cardiac markers were normal. Your CT scan of your chest was normal except for mild enlargement of your heart. Please call your cardiologist today to schedule an appointment for further evaluation of your chest pain. You may take tylenol or ibuprofen as needed for pain. Return to the ER for new or worsening symptoms.

## 2019-11-16 NOTE — ED Triage Notes (Signed)
Patient reports central chest pain with SOB this evening , denies emesis or diaphoresis .
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
## 2019-11-16 NOTE — ED Notes (Signed)
Pt was taken to CT

## 2019-11-23 ENCOUNTER — Encounter (HOSPITAL_COMMUNITY): Payer: Self-pay

## 2019-11-23 ENCOUNTER — Emergency Department (HOSPITAL_COMMUNITY): Payer: No Typology Code available for payment source

## 2019-11-23 ENCOUNTER — Inpatient Hospital Stay (HOSPITAL_COMMUNITY)
Admission: EM | Admit: 2019-11-23 | Discharge: 2019-11-29 | DRG: 286 | Disposition: A | Discharge status: Home / Self Care | Payer: No Typology Code available for payment source | Attending: Cardiovascular Disease | Admitting: Cardiovascular Disease

## 2019-11-23 ENCOUNTER — Other Ambulatory Visit: Payer: Self-pay

## 2019-11-23 ENCOUNTER — Inpatient Hospital Stay (HOSPITAL_COMMUNITY): Payer: No Typology Code available for payment source

## 2019-11-23 DIAGNOSIS — Z79899 Other long term (current) drug therapy: Secondary | ICD-10-CM

## 2019-11-23 DIAGNOSIS — Z950 Presence of cardiac pacemaker: Secondary | ICD-10-CM

## 2019-11-23 DIAGNOSIS — I110 Hypertensive heart disease with heart failure: Secondary | ICD-10-CM | POA: Diagnosis present

## 2019-11-23 DIAGNOSIS — I3139 Other pericardial effusion (noninflammatory): Secondary | ICD-10-CM

## 2019-11-23 DIAGNOSIS — I509 Heart failure, unspecified: Secondary | ICD-10-CM | POA: Diagnosis not present

## 2019-11-23 DIAGNOSIS — I309 Acute pericarditis, unspecified: Secondary | ICD-10-CM | POA: Diagnosis present

## 2019-11-23 DIAGNOSIS — E785 Hyperlipidemia, unspecified: Secondary | ICD-10-CM | POA: Diagnosis present

## 2019-11-23 DIAGNOSIS — I313 Pericardial effusion (noninflammatory): Secondary | ICD-10-CM

## 2019-11-23 DIAGNOSIS — J441 Chronic obstructive pulmonary disease with (acute) exacerbation: Secondary | ICD-10-CM | POA: Diagnosis present

## 2019-11-23 DIAGNOSIS — Z7982 Long term (current) use of aspirin: Secondary | ICD-10-CM | POA: Diagnosis not present

## 2019-11-23 DIAGNOSIS — I428 Other cardiomyopathies: Secondary | ICD-10-CM | POA: Diagnosis present

## 2019-11-23 DIAGNOSIS — I429 Cardiomyopathy, unspecified: Secondary | ICD-10-CM | POA: Diagnosis not present

## 2019-11-23 DIAGNOSIS — F4310 Post-traumatic stress disorder, unspecified: Secondary | ICD-10-CM | POA: Diagnosis present

## 2019-11-23 DIAGNOSIS — I319 Disease of pericardium, unspecified: Secondary | ICD-10-CM | POA: Diagnosis present

## 2019-11-23 DIAGNOSIS — I5043 Acute on chronic combined systolic (congestive) and diastolic (congestive) heart failure: Secondary | ICD-10-CM | POA: Diagnosis not present

## 2019-11-23 DIAGNOSIS — Z20822 Contact with and (suspected) exposure to covid-19: Secondary | ICD-10-CM | POA: Diagnosis present

## 2019-11-23 DIAGNOSIS — I5023 Acute on chronic systolic (congestive) heart failure: Secondary | ICD-10-CM | POA: Diagnosis present

## 2019-11-23 DIAGNOSIS — I442 Atrioventricular block, complete: Secondary | ICD-10-CM | POA: Diagnosis present

## 2019-11-23 DIAGNOSIS — I25110 Atherosclerotic heart disease of native coronary artery with unstable angina pectoris: Secondary | ICD-10-CM | POA: Diagnosis present

## 2019-11-23 DIAGNOSIS — J9601 Acute respiratory failure with hypoxia: Secondary | ICD-10-CM | POA: Diagnosis not present

## 2019-11-23 DIAGNOSIS — B3323 Viral pericarditis: Secondary | ICD-10-CM | POA: Diagnosis not present

## 2019-11-23 DIAGNOSIS — Z8546 Personal history of malignant neoplasm of prostate: Secondary | ICD-10-CM

## 2019-11-23 DIAGNOSIS — Z87891 Personal history of nicotine dependence: Secondary | ICD-10-CM | POA: Diagnosis not present

## 2019-11-23 DIAGNOSIS — R238 Other skin changes: Secondary | ICD-10-CM | POA: Diagnosis present

## 2019-11-23 DIAGNOSIS — I5021 Acute systolic (congestive) heart failure: Secondary | ICD-10-CM | POA: Diagnosis not present

## 2019-11-23 LAB — CBC
HCT: 37.5 % — ABNORMAL LOW (ref 39.0–52.0)
Hemoglobin: 12.3 g/dL — ABNORMAL LOW (ref 13.0–17.0)
MCH: 27.6 pg (ref 26.0–34.0)
MCHC: 32.8 g/dL (ref 30.0–36.0)
MCV: 84.3 fL (ref 80.0–100.0)
Platelets: 335 10*3/uL (ref 150–400)
RBC: 4.45 MIL/uL (ref 4.22–5.81)
RDW: 13.2 % (ref 11.5–15.5)
WBC: 9.8 10*3/uL (ref 4.0–10.5)
nRBC: 0 % (ref 0.0–0.2)

## 2019-11-23 LAB — I-STAT VENOUS BLOOD GAS, ED
Acid-base deficit: 1 mmol/L (ref 0.0–2.0)
Bicarbonate: 25.4 mmol/L (ref 20.0–28.0)
Calcium, Ion: 1.13 mmol/L — ABNORMAL LOW (ref 1.15–1.40)
HCT: 37 % — ABNORMAL LOW (ref 39.0–52.0)
Hemoglobin: 12.6 g/dL — ABNORMAL LOW (ref 13.0–17.0)
O2 Saturation: 64 %
Potassium: 4.7 mmol/L (ref 3.5–5.1)
Sodium: 135 mmol/L (ref 135–145)
TCO2: 27 mmol/L (ref 22–32)
pCO2, Ven: 46.6 mmHg (ref 44.0–60.0)
pH, Ven: 7.345 (ref 7.250–7.430)
pO2, Ven: 35 mmHg (ref 32.0–45.0)

## 2019-11-23 LAB — BASIC METABOLIC PANEL
Anion gap: 12 (ref 5–15)
BUN: 8 mg/dL (ref 8–23)
CO2: 20 mmol/L — ABNORMAL LOW (ref 22–32)
Calcium: 8.9 mg/dL (ref 8.9–10.3)
Chloride: 100 mmol/L (ref 98–111)
Creatinine, Ser: 0.93 mg/dL (ref 0.61–1.24)
GFR calc Af Amer: >60 mL/min (ref >60)
GFR calc non Af Amer: >60 mL/min (ref >60)
Glucose, Bld: 155 mg/dL — ABNORMAL HIGH (ref 70–99)
Potassium: 4 mmol/L (ref 3.5–5.1)
Sodium: 132 mmol/L — ABNORMAL LOW (ref 135–145)

## 2019-11-23 LAB — TROPONIN I (HIGH SENSITIVITY)
Troponin I (High Sensitivity): 19 ng/L — ABNORMAL HIGH (ref <18)
Troponin I (High Sensitivity): 19 ng/L — ABNORMAL HIGH (ref <18)

## 2019-11-23 LAB — SARS CORONAVIRUS 2 BY RT PCR (HOSPITAL ORDER, PERFORMED IN ~~LOC~~ HOSPITAL LAB): SARS Coronavirus 2: NEGATIVE

## 2019-11-23 LAB — C-REACTIVE PROTEIN: CRP: 20.3 mg/dL — ABNORMAL HIGH (ref <1.0)

## 2019-11-23 LAB — SEDIMENTATION RATE: Sed Rate: 53 mm/hr — ABNORMAL HIGH (ref 0–16)

## 2019-11-23 LAB — BRAIN NATRIURETIC PEPTIDE: B Natriuretic Peptide: 342 pg/mL — ABNORMAL HIGH (ref 0.0–100.0)

## 2019-11-23 MED ORDER — NITROGLYCERIN 0.4 MG SL SUBL
SUBLINGUAL_TABLET | SUBLINGUAL | Status: AC
  Administered 2019-11-23: 0.4 mg
  Administered 2019-11-23: 0.4 mg via ORAL
  Filled 2019-11-23: qty 1

## 2019-11-23 MED ORDER — HYDROMORPHONE HCL 1 MG/ML IJ SOLN
1.0000 mg | INTRAMUSCULAR | Status: AC | PRN
  Administered 2019-11-23 – 2019-11-24 (×3): 1 mg via INTRAVENOUS
  Filled 2019-11-23 (×4): qty 1

## 2019-11-23 MED ORDER — SODIUM CHLORIDE 0.9 % IV BOLUS
1000.0000 mL | Freq: Once | INTRAVENOUS | Status: AC
  Administered 2019-11-23: 1000 mL via INTRAVENOUS

## 2019-11-23 MED ORDER — HYDROMORPHONE HCL 1 MG/ML IJ SOLN
1.0000 mg | Freq: Once | INTRAMUSCULAR | Status: AC
  Administered 2019-11-23: 1 mg via INTRAVENOUS
  Filled 2019-11-23: qty 1

## 2019-11-23 MED ORDER — MENTHOL 3 MG MT LOZG: 1.0000 | LOZENGE | OROMUCOSAL | Status: DC | PRN

## 2019-11-23 MED ORDER — PANTOPRAZOLE SODIUM 40 MG PO TBEC
40.0000 mg | DELAYED_RELEASE_TABLET | Freq: Two times a day (BID) | ORAL | Status: DC
  Administered 2019-11-23 – 2019-11-29 (×12): 40 mg via ORAL
  Filled 2019-11-23 (×12): qty 1

## 2019-11-23 MED ORDER — SODIUM CHLORIDE 0.9 % IV SOLN: 250.0000 mL | INTRAVENOUS | Status: DC | PRN

## 2019-11-23 MED ORDER — MORPHINE SULFATE (PF) 4 MG/ML IV SOLN
4.0000 mg | Freq: Once | INTRAVENOUS | Status: AC
  Administered 2019-11-23: 4 mg via INTRAVENOUS
  Filled 2019-11-23: qty 1

## 2019-11-23 MED ORDER — COLCHICINE 0.6 MG PO TABS
0.6000 mg | ORAL_TABLET | Freq: Two times a day (BID) | ORAL | Status: DC
  Administered 2019-11-24 – 2019-11-29 (×12): 0.6 mg via ORAL
  Filled 2019-11-23 (×12): qty 1

## 2019-11-23 MED ORDER — UMECLIDINIUM BROMIDE 62.5 MCG/INH IN AEPB
1.0000 | INHALATION_SPRAY | Freq: Every day | RESPIRATORY_TRACT | Status: DC
  Administered 2019-11-24 – 2019-11-29 (×6): 1 via RESPIRATORY_TRACT
  Filled 2019-11-23 (×2): qty 7

## 2019-11-23 MED ORDER — ALBUTEROL SULFATE (2.5 MG/3ML) 0.083% IN NEBU: 2.5000 mg | INHALATION_SOLUTION | Freq: Four times a day (QID) | RESPIRATORY_TRACT | Status: DC | PRN

## 2019-11-23 MED ORDER — SIMVASTATIN 20 MG PO TABS
10.0000 mg | ORAL_TABLET | Freq: Every day | ORAL | Status: DC
  Administered 2019-11-23 – 2019-11-28 (×6): 10 mg via ORAL
  Filled 2019-11-23 (×6): qty 1

## 2019-11-23 MED ORDER — HEPARIN SODIUM (PORCINE) 5000 UNIT/ML IJ SOLN
5000.0000 [IU] | Freq: Three times a day (TID) | INTRAMUSCULAR | Status: DC
  Administered 2019-11-23: 5000 [IU] via SUBCUTANEOUS
  Filled 2019-11-23: qty 1

## 2019-11-23 MED ORDER — IOHEXOL 350 MG/ML SOLN
100.0000 mL | Freq: Once | INTRAVENOUS | Status: AC | PRN
  Administered 2019-11-23: 100 mL via INTRAVENOUS

## 2019-11-23 MED ORDER — COLCHICINE 0.6 MG PO TABS: 0.6000 mg | ORAL_TABLET | Freq: Two times a day (BID) | ORAL | Status: DC

## 2019-11-23 MED ORDER — FENTANYL CITRATE (PF) 100 MCG/2ML IJ SOLN
25.0000 ug | Freq: Once | INTRAMUSCULAR | Status: AC
  Administered 2019-11-23: 25 ug via INTRAVENOUS
  Filled 2019-11-23: qty 2

## 2019-11-23 MED ORDER — COLCHICINE 0.3 MG HALF TABLET
0.3000 mg | ORAL_TABLET | Freq: Once | ORAL | Status: DC
  Filled 2019-11-23: qty 1

## 2019-11-23 MED ORDER — ACETAMINOPHEN 500 MG PO TABS: 500.0000 mg | ORAL_TABLET | Freq: Four times a day (QID) | ORAL | Status: DC | PRN

## 2019-11-23 MED ORDER — ONDANSETRON HCL 4 MG/2ML IJ SOLN: 4.0000 mg | Freq: Four times a day (QID) | INTRAMUSCULAR | Status: DC | PRN

## 2019-11-23 MED ORDER — SODIUM CHLORIDE 0.9% FLUSH: 3.0000 mL | INTRAVENOUS | Status: DC | PRN

## 2019-11-23 MED ORDER — SODIUM CHLORIDE 0.9% FLUSH
3.0000 mL | Freq: Once | INTRAVENOUS | Status: AC
  Administered 2019-11-23: 3 mL via INTRAVENOUS

## 2019-11-23 MED ORDER — ALBUTEROL SULFATE HFA 108 (90 BASE) MCG/ACT IN AERS
2.0000 | INHALATION_SPRAY | Freq: Once | RESPIRATORY_TRACT | Status: AC
  Administered 2019-11-23: 2 via RESPIRATORY_TRACT
  Filled 2019-11-23: qty 6.7

## 2019-11-23 MED ORDER — IBUPROFEN 400 MG PO TABS: 600.0000 mg | ORAL_TABLET | Freq: Three times a day (TID) | ORAL | Status: DC

## 2019-11-23 MED ORDER — ACETAMINOPHEN 325 MG PO TABS
650.0000 mg | ORAL_TABLET | ORAL | Status: DC | PRN
  Administered 2019-11-24: 650 mg via ORAL
  Filled 2019-11-23: qty 2

## 2019-11-23 MED ORDER — SODIUM CHLORIDE 0.9% FLUSH
3.0000 mL | Freq: Two times a day (BID) | INTRAVENOUS | Status: DC
  Administered 2019-11-23 – 2019-11-25 (×3): 3 mL via INTRAVENOUS

## 2019-11-23 MED ORDER — IPRATROPIUM-ALBUTEROL 0.5-2.5 (3) MG/3ML IN SOLN: 3.0000 mL | RESPIRATORY_TRACT | Status: DC | PRN

## 2019-11-23 MED ORDER — METHYLPREDNISOLONE SODIUM SUCC 125 MG IJ SOLR
125.0000 mg | Freq: Once | INTRAMUSCULAR | Status: AC
  Administered 2019-11-23: 125 mg via INTRAVENOUS
  Filled 2019-11-23: qty 2

## 2019-11-23 MED ORDER — PAROXETINE HCL 20 MG PO TABS
60.0000 mg | ORAL_TABLET | Freq: Every day | ORAL | Status: DC
  Administered 2019-11-23 – 2019-11-28 (×6): 60 mg via ORAL
  Filled 2019-11-23 (×6): qty 3

## 2019-11-23 MED ORDER — PREDNISONE 20 MG PO TABS
20.0000 mg | ORAL_TABLET | Freq: Two times a day (BID) | ORAL | Status: AC
  Administered 2019-11-24 – 2019-11-27 (×9): 20 mg via ORAL
  Filled 2019-11-23 (×9): qty 1

## 2019-11-23 MED ORDER — NITROGLYCERIN IN D5W 200-5 MCG/ML-% IV SOLN
0.0000 ug/min | INTRAVENOUS | Status: DC
  Filled 2019-11-23: qty 250
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
## 2019-11-23 NOTE — ED Triage Notes (Signed)
Pt arrives to ED w/ c/o 10/10 centrally located chest pain radiating to left arm and jaw and sob. Pt states he has a pacemaker and hx of MI. Pt denies n/v. Pt tachycardic in triage.

## 2019-11-24 DIAGNOSIS — I442 Atrioventricular block, complete: Secondary | ICD-10-CM

## 2019-11-24 DIAGNOSIS — I5021 Acute systolic (congestive) heart failure: Secondary | ICD-10-CM

## 2019-11-24 DIAGNOSIS — J9601 Acute respiratory failure with hypoxia: Secondary | ICD-10-CM

## 2019-11-24 DIAGNOSIS — I313 Pericardial effusion (noninflammatory): Secondary | ICD-10-CM

## 2019-11-24 DIAGNOSIS — E785 Hyperlipidemia, unspecified: Secondary | ICD-10-CM

## 2019-11-24 DIAGNOSIS — I309 Acute pericarditis, unspecified: Principal | ICD-10-CM

## 2019-11-24 LAB — CBC WITH DIFFERENTIAL/PLATELET
Abs Immature Granulocytes: 0.1 10*3/uL — ABNORMAL HIGH (ref 0.00–0.07)
Basophils Absolute: 0 10*3/uL (ref 0.0–0.1)
Basophils Relative: 0 %
Eosinophils Absolute: 0 10*3/uL (ref 0.0–0.5)
Eosinophils Relative: 0 %
HCT: 39 % (ref 39.0–52.0)
Hemoglobin: 12.3 g/dL — ABNORMAL LOW (ref 13.0–17.0)
Immature Granulocytes: 1 %
Lymphocytes Relative: 3 %
Lymphs Abs: 0.4 10*3/uL — ABNORMAL LOW (ref 0.7–4.0)
MCH: 27.3 pg (ref 26.0–34.0)
MCHC: 31.5 g/dL (ref 30.0–36.0)
MCV: 86.7 fL (ref 80.0–100.0)
Monocytes Absolute: 0.5 10*3/uL (ref 0.1–1.0)
Monocytes Relative: 5 %
Neutro Abs: 10.5 10*3/uL — ABNORMAL HIGH (ref 1.7–7.7)
Neutrophils Relative %: 91 %
Platelets: 338 10*3/uL (ref 150–400)
RBC: 4.5 MIL/uL (ref 4.22–5.81)
RDW: 13.2 % (ref 11.5–15.5)
WBC: 11.6 10*3/uL — ABNORMAL HIGH (ref 4.0–10.5)
nRBC: 0 % (ref 0.0–0.2)

## 2019-11-24 LAB — RESP PANEL BY RT PCR (RSV, FLU A&B, COVID)
Influenza A by PCR: NEGATIVE
Influenza B by PCR: NEGATIVE
Respiratory Syncytial Virus by PCR: NEGATIVE
SARS Coronavirus 2 by RT PCR: NEGATIVE

## 2019-11-24 LAB — BLOOD GAS, VENOUS
Acid-base deficit: 1.9 mmol/L (ref 0.0–2.0)
Bicarbonate: 24.4 mmol/L (ref 20.0–28.0)
FIO2: 28
O2 Saturation: 42.2 %
Patient temperature: 37
pCO2, Ven: 57.4 mmHg (ref 44.0–60.0)
pH, Ven: 7.251 (ref 7.250–7.430)
pO2, Ven: <31 mmHg — CL (ref 32.0–45.0)

## 2019-11-24 LAB — URINALYSIS, ROUTINE W REFLEX MICROSCOPIC
Bilirubin Urine: NEGATIVE
Glucose, UA: NEGATIVE mg/dL
Ketones, ur: NEGATIVE mg/dL
Leukocytes,Ua: NEGATIVE
Nitrite: NEGATIVE
Protein, ur: 30 mg/dL — AB
Specific Gravity, Urine: >1.046 — ABNORMAL HIGH (ref 1.005–1.030)
pH: 6 (ref 5.0–8.0)

## 2019-11-24 LAB — HEPATIC FUNCTION PANEL
ALT: 14 U/L (ref 0–44)
AST: 18 U/L (ref 15–41)
Albumin: 3 g/dL — ABNORMAL LOW (ref 3.5–5.0)
Alkaline Phosphatase: 34 U/L — ABNORMAL LOW (ref 38–126)
Bilirubin, Direct: 0.1 mg/dL (ref 0.0–0.2)
Indirect Bilirubin: 0.2 mg/dL — ABNORMAL LOW (ref 0.3–0.9)
Total Bilirubin: 0.3 mg/dL (ref 0.3–1.2)
Total Protein: 7 g/dL (ref 6.5–8.1)

## 2019-11-24 LAB — PROTIME-INR
INR: 1.2 (ref 0.8–1.2)
Prothrombin Time: 14.8 seconds (ref 11.4–15.2)

## 2019-11-24 LAB — BASIC METABOLIC PANEL
Anion gap: 10 (ref 5–15)
BUN: 10 mg/dL (ref 8–23)
CO2: 22 mmol/L (ref 22–32)
Calcium: 8.9 mg/dL (ref 8.9–10.3)
Chloride: 101 mmol/L (ref 98–111)
Creatinine, Ser: 1.07 mg/dL (ref 0.61–1.24)
GFR calc Af Amer: >60 mL/min (ref >60)
GFR calc non Af Amer: >60 mL/min (ref >60)
Glucose, Bld: 179 mg/dL — ABNORMAL HIGH (ref 70–99)
Potassium: 5.3 mmol/L — ABNORMAL HIGH (ref 3.5–5.1)
Sodium: 133 mmol/L — ABNORMAL LOW (ref 135–145)

## 2019-11-24 LAB — LACTIC ACID, PLASMA
Lactic Acid, Venous: 2 mmol/L (ref 0.5–1.9)
Lactic Acid, Venous: 0.9 mmol/L (ref 0.5–1.9)

## 2019-11-24 LAB — HEMOGLOBIN A1C
Hgb A1c MFr Bld: 6.5 % — ABNORMAL HIGH (ref 4.8–5.6)
Mean Plasma Glucose: 139.85 mg/dL

## 2019-11-24 LAB — APTT: aPTT: 37 seconds — ABNORMAL HIGH (ref 24–36)

## 2019-11-24 LAB — C-REACTIVE PROTEIN: CRP: 26.6 mg/dL — ABNORMAL HIGH (ref <1.0)

## 2019-11-24 LAB — PROCALCITONIN: Procalcitonin: 0.33 ng/mL

## 2019-11-24 MED ORDER — SODIUM CHLORIDE 0.9 % IV SOLN
2.0000 g | INTRAVENOUS | Status: AC
  Administered 2019-11-24 – 2019-11-28 (×5): 2 g via INTRAVENOUS
  Filled 2019-11-24: qty 2
  Filled 2019-11-24 (×4): qty 20
  Filled 2019-11-24: qty 2

## 2019-11-24 MED ORDER — LOSARTAN POTASSIUM 25 MG PO TABS
12.5000 mg | ORAL_TABLET | Freq: Every day | ORAL | Status: DC
  Administered 2019-11-24 – 2019-11-25 (×2): 12.5 mg via ORAL
  Filled 2019-11-24 (×2): qty 1

## 2019-11-24 MED ORDER — IPRATROPIUM-ALBUTEROL 0.5-2.5 (3) MG/3ML IN SOLN
3.0000 mL | Freq: Four times a day (QID) | RESPIRATORY_TRACT | Status: DC
  Administered 2019-11-24: 3 mL via RESPIRATORY_TRACT
  Filled 2019-11-24: qty 3

## 2019-11-24 MED ORDER — HYDROMORPHONE HCL 1 MG/ML IJ SOLN
1.0000 mg | Freq: Once | INTRAMUSCULAR | Status: AC
  Administered 2019-11-24: 1 mg via INTRAVENOUS
  Filled 2019-11-24: qty 1

## 2019-11-24 MED ORDER — IPRATROPIUM-ALBUTEROL 0.5-2.5 (3) MG/3ML IN SOLN
3.0000 mL | Freq: Three times a day (TID) | RESPIRATORY_TRACT | Status: DC
  Administered 2019-11-24 – 2019-11-26 (×6): 3 mL via RESPIRATORY_TRACT
  Filled 2019-11-24 (×5): qty 3

## 2019-11-24 MED ORDER — AZITHROMYCIN 250 MG PO TABS
250.0000 mg | ORAL_TABLET | Freq: Every day | ORAL | Status: AC
  Administered 2019-11-25 – 2019-11-28 (×4): 250 mg via ORAL
  Filled 2019-11-24 (×4): qty 1

## 2019-11-24 MED ORDER — AZITHROMYCIN 250 MG PO TABS
500.0000 mg | ORAL_TABLET | Freq: Every day | ORAL | Status: AC
  Administered 2019-11-24: 500 mg via ORAL
  Filled 2019-11-24: qty 2

## 2019-11-24 MED ORDER — CEFTRIAXONE SODIUM 2 G IJ SOLR
2.0000 g | Freq: Every day | INTRAMUSCULAR | Status: DC
  Filled 2019-11-24: qty 20
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
## 2019-11-24 NOTE — Progress Notes (Signed)
STAT echo images reviewed. Clinical history reviewed and discussed with Dr. Marletta Lor.  Unfortunately, the echo reporting system is locked and I am unable to input the study results.  - mildly dilated left ventricle with severe global hypokinesis (EF 20-25%) and RV apical-pacing related paradoxical septal motion/dyssynchrony. The reduced EF is a striking new finding compared to February 2021. - the mitral inflow is pseudonormal, consistent with high left atrial pressure - the right ventricle is normal in size and has preserved systolic function. Pacemaker lead seen - there are no major valvular abnormalities - the aorta is fairly well visualized and is normal in caliber, without echo evidence of dissection - no major valve pathology is identified - there is a moderate circumferential pericardial effusion - the inferior vena cava is plethoric, consistent with increased right atrial pressure - there are frequent PACs, which make interpretation of the Doppler signal more challenging, but there is no clear evidence of enhanced respiratory mitral or tricuspid flow variation to support tamponade. There is no diastolic RA or RV chamber collapse.  Although the echo findings are not conclusive, tamponade appears unlikely. Still need to monitor clinical status closely for development of tamponade. Rather, the major abnormality is newly decreased LV systolic function. Markedly abnormal inflammatory markers support concern for myopericarditis. Pacemaker wires implanted about 5 months ago, making perforation less likely.  Currently normotensive, not tachycardic. Dr. Aubery Lapping clinical findings and labs do not suggest impaired cardiac output. Agree with current care plan, with a low threshold for ICU care should his hemodynamic status deteriorate.

## 2019-11-24 NOTE — Progress Notes (Signed)
Attempted to administer IV Dilaudid to pt via IV push @ 0414; plunger of the syringe detached and medication spilled onto the bed. Witnessed by charge nurse Crisoforo Oxford, RN. Georga Hacking MD paged for replacement dose. Administered replacement dose @ 0438.   Elaina Hoops, RN

## 2019-11-24 NOTE — Progress Notes (Signed)
CRITICAL VALUE ALERT  Critical Value:  Lactic acid: 2.0, pCO2: >31  Date & Time Notied:  11/24/19 @ 0300  Provider Notified: Georga Hacking, MD  Orders Received/Actions taken: Awaiting orders  Elaina Hoops, RN

## 2019-11-24 NOTE — Progress Notes (Signed)
Progress Note  Patient Name: Glenn Brandt Date of Encounter: 11/24/2019  [facility] Cardiologist: New  Subjective   Ongoing cough  Inpatient Medications    Scheduled Meds: . [START ON 11/25/2019] azithromycin  250 mg Oral Daily  . colchicine  0.6 mg Oral BID  . pantoprazole  40 mg Oral BID  . PARoxetine  60 mg Oral QHS  . predniSONE  20 mg Oral BID WC  . simvastatin  10 mg Oral QHS  . sodium chloride flush  3 mL Intravenous Q12H  . umeclidinium bromide  1 puff Inhalation Daily   Continuous Infusions: . sodium chloride    . cefTRIAXone (ROCEPHIN)  IV 2 g (11/24/19 0539)   PRN Meds: sodium chloride, acetaminophen, albuterol, HYDROmorphone (DILAUDID) injection, ipratropium-albuterol, menthol-cetylpyridinium, ondansetron (ZOFRAN) IV, sodium chloride flush   Vital Signs    Vitals:   11/24/19 0242 11/24/19 0431 11/24/19 0540 11/24/19 0816  BP: 122/74 136/85 (!) 128/96 128/85  Pulse: 82 (!) 102 97 92  Resp: (!) '22 16 16 16  '$ Temp: 98.9 F (37.2 C) 98.6 F (37 C) 98.4 F (36.9 C) 98.2 F (36.8 C)  TempSrc: Oral Oral Oral Oral  SpO2: 94% 96% 95% 95%  Weight:      Height:        Intake/Output Summary (Last 24 hours) at 11/24/2019 1022 Last data filed at 11/24/2019 0800 Gross per 24 hour  Intake 1300 ml  Output 770 ml  Net 530 ml   Last 3 Weights 11/24/2019 11/16/2019 11/08/2019  Weight (lbs) 195 lb 1.7 oz 198 lb 6.6 oz 190 lb 14.7 oz  Weight (kg) 88.5 kg 90 kg 86.6 kg      Telemetry    A sensed, V paced - Personally Reviewed  ECG    n/a - Personally Reviewed  Physical Exam   GEN: No acute distress.   Neck: No JVD Cardiac: RRR, no murmurs, rubs, or gallops.  Respiratory: bilateral wheezing GI: Soft, nontender, non-distended  MS: No edema; No deformity. Neuro:  Nonfocal  Psych: Normal affect   Labs    High Sensitivity Troponin:   Recent Labs  Lab 11/16/19 0604 11/16/19 0852 11/16/19 1236 11/23/19 1721 11/23/19 1919  TROPONINIHS '9 10 12 '$ 19*  19*      Chemistry Recent Labs  Lab 11/23/19 1721 11/23/19 1946 11/24/19 0109 11/24/19 0112  NA 132* 135 133*  --   K 4.0 4.7 5.3*  --   CL 100  --  101  --   CO2 20*  --  22  --   GLUCOSE 155*  --  179*  --   BUN 8  --  10  --   CREATININE 0.93  --  1.07  --   CALCIUM 8.9  --  8.9  --   PROT  --   --   --  7.0  ALBUMIN  --   --   --  3.0*  AST  --   --   --  18  ALT  --   --   --  14  ALKPHOS  --   --   --  34*  BILITOT  --   --   --  0.3  GFRNONAA >60  --  >60  --   GFRAA >60  --  >60  --   ANIONGAP 12  --  10  --      Hematology Recent Labs  Lab 11/23/19 1721 11/23/19 1946 11/24/19 0112  WBC 9.8  --  11.6*  RBC 4.45  --  4.50  HGB 12.3* 12.6* 12.3*  HCT 37.5* 37.0* 39.0  MCV 84.3  --  86.7  MCH 27.6  --  27.3  MCHC 32.8  --  31.5  RDW 13.2  --  13.2  PLT 335  --  338    BNP Recent Labs  Lab 11/23/19 1736  BNP 342.0*     DDimer No results for input(s): DDIMER in the last 168 hours.   Radiology    CT Angio Chest PE W and/or Wo Contrast  Result Date: 11/23/2019 CLINICAL DATA:  Dyspnea, chest pain EXAM: CT ANGIOGRAPHY CHEST WITH CONTRAST TECHNIQUE: Multidetector CT imaging of the chest was performed using the standard protocol during bolus administration of intravenous contrast. Multiplanar CT image reconstructions and MIPs were obtained to evaluate the vascular anatomy. CONTRAST:  142m OMNIPAQUE IOHEXOL 350 MG/ML SOLN COMPARISON:  11/16/2019 FINDINGS: Cardiovascular: Satisfactory opacification of the pulmonary arteries to the segmental level. No evidence of pulmonary embolism. Since the prior examination, there has developed a moderate pericardial effusion. The effusion demonstrates slightly hyperdense fluid suggesting a a complex effusion with hemorrhagic or proteinaceous components. There is no CT evidence of cardiac tamponade. Global cardiac size is within normal limits and unchanged from prior examination, though left ventricular dilation is again  noted. Pacemaker leads are seen within the right atrium and ventricle from a left subclavian dual lead pacemaker. The thoracic aorta is poorly opacified, but is unremarkable. Mediastinum/Nodes: There has developed progressive shoddy adenopathy within the a prevascular and right paratracheal lymph node groups without frank pathologic enlargement. These may be reactive or inflammatory in nature. Lungs/Pleura: Mild motion artifact. There is increasing collapse and consolidation of the left lower lobe small left pleural effusion has developed. Upper Abdomen: No acute abnormality. Musculoskeletal: No acute bone abnormality. Review of the MIP images confirms the above findings. IMPRESSION: Interval development of a moderate pericardial effusion. Pericardial fluid appears slightly complex suggesting a proteinaceous or hemorrhagic component and suggesting that this may be infectious or inflammatory in nature. No CT evidence of cardiac tamponade. Interval development of small left pleural effusion with increasing collapse of the left lower lobe. No pulmonary embolism. Electronically Signed   By: AFidela SalisburyMD   On: 11/23/2019 21:27   DG Chest Port 1 View  Result Date: 11/23/2019 CLINICAL DATA:  Shortness of breath. EXAM: PORTABLE CHEST 1 VIEW COMPARISON:  November 16, 2019. FINDINGS: Stable cardiomegaly. Left-sided pacemaker is unchanged in position. No pneumothorax or pleural effusion is noted. Mild bibasilar subsegmental atelectasis is noted. The visualized skeletal structures are unremarkable. IMPRESSION: Mild bibasilar subsegmental atelectasis. Electronically Signed   By: JMarijo ConceptionM.D.   On: 11/23/2019 18:22    Cardiac Studies    Patient Profile     Glenn Brandt a 59y.o. male with CHB s/p dual chamber MDT PPM 06/2019, HTN, and ?COPD who presents with chest pain.   Assessment & Plan    1. Pericardial effusion/Pericarditis - CT PE moderate effusion, Pericardial fluid appears slightly complex  suggesting a proteinaceous or hemorrhagic component and suggesting that this may be infectious or inflammatory in nature.  - ESR 53, CRP 20. Trop 19 and flat. BNP 342  - COVID neg, flu neg, RSV neg,  - started on colchcine, prednisone. Avoiding NSAIDs given systolic HF.   - from echo read last night Dr CSallyanne Kuster LVEF 20-25%, grade II DDx, mod effusion. PACs complicate evaluation. No clear evidence of respiratory variation, no RA/RV collapse -  repeat limited echo tomorrow AM to eval effusion - mild flat trop would argue against an aggressive myocarditis  - hemodynamically no signs of tamponadoe, sats good on 1L [place]. Continue to monitor - would plan for LHC/RHC given acute systolic dysfunction, evaluate LV/RV pressures at that time.    2. Acute systolic HF - echo LVEF 75-10%, new finding - BNP 343, chest imaging without significant edema - mild flat trop, no significant ventricular arrhyhmias would argue againt a severe aggressive myocarditis at this time  - does not appear significantly volume overloaded by exam. Avoid aggreisvley dropping filling pressures with his pericardial effusion.  - with active wheezign on hold on beta blocker - start losartan 12.'5mg'$  daily  3. Cough/SOB/COPD exacerbation - elevated procalcitonin,  - CT PE left pleural effusion, some LLL atelectasis - WBC 9.8 on admission, uptrend today unclear if steroids.  - on azithro and ceftriaxone for possible pneumonia - wheezing on exam. On prednisone, nebs  4. Rash - continue to monitor, perhaps part of a potential viral syndrome.   For questions or updates, please contact [place] Please consult [email or web address] for contact info under        Signed, Carlyle Dolly, MD  11/24/2019, 10:22 AM

## 2019-11-24 NOTE — Progress Notes (Signed)
   11/24/19 0044  Assess: MEWS Score  Temp 99.1 F (37.3 C)  BP 135/78  Pulse Rate 66  ECG Heart Rate 66  Resp (!) 25  Level of Consciousness Alert  SpO2 97 %  O2 Device Nasal Cannula  O2 Flow Rate (L/min) 2 L/min  Assess: MEWS Score  MEWS Temp 0  MEWS Systolic 0  MEWS Pulse 0  MEWS RR 1  MEWS LOC 0  MEWS Score 1  MEWS Score Color Green  Assess: if the MEWS score is Yellow or Red  Were vital signs taken at a resting state? Yes  Focused Assessment Documented focused assessment  Early Detection of Sepsis Score *See Row Information* Low  MEWS guidelines implemented *See Row Information* Yes  Treat  MEWS Interventions Administered prn meds/treatments  Take Vital Signs  Increase Vital Sign Frequency  Yellow: Q 2hr X 2 then Q 4hr X 2, if remains yellow, continue Q 4hrs  Escalate  MEWS: Escalate Yellow: discuss with charge nurse/RN and consider discussing with provider and RRT  Notify: Charge Nurse/RN  Name of Charge Nurse/RN Notified [place], RN  Date Charge Nurse/RN Notified 11/24/19  Time Charge Nurse/RN Notified 0050  Document  Progress note created (see row info) Yes  Elaina Hoops, RN

## 2019-11-24 NOTE — Progress Notes (Signed)
  Echocardiogram 2D Echocardiogram has been performed.  [place] Dorvil G Carlei Huang 11/24/2019, 12:17 AM

## 2019-11-25 ENCOUNTER — Inpatient Hospital Stay (HOSPITAL_COMMUNITY): Payer: No Typology Code available for payment source

## 2019-11-25 DIAGNOSIS — I313 Pericardial effusion (noninflammatory): Secondary | ICD-10-CM

## 2019-11-25 LAB — ECHOCARDIOGRAM LIMITED
Height: 71 in
S' Lateral: 5.1 cm
Weight: 3125.24 oz

## 2019-11-25 LAB — BASIC METABOLIC PANEL
Anion gap: 7 (ref 5–15)
BUN: 14 mg/dL (ref 8–23)
CO2: 26 mmol/L (ref 22–32)
Calcium: 8.7 mg/dL — ABNORMAL LOW (ref 8.9–10.3)
Chloride: 101 mmol/L (ref 98–111)
Creatinine, Ser: 0.85 mg/dL (ref 0.61–1.24)
GFR calc Af Amer: >60 mL/min (ref >60)
GFR calc non Af Amer: >60 mL/min (ref >60)
Glucose, Bld: 171 mg/dL — ABNORMAL HIGH (ref 70–99)
Potassium: 4.6 mmol/L (ref 3.5–5.1)
Sodium: 134 mmol/L — ABNORMAL LOW (ref 135–145)

## 2019-11-25 MED ORDER — SODIUM CHLORIDE 0.9% FLUSH
3.0000 mL | Freq: Two times a day (BID) | INTRAVENOUS | Status: DC
  Administered 2019-11-27: 3 mL via INTRAVENOUS

## 2019-11-25 MED ORDER — HYDROMORPHONE HCL 1 MG/ML IJ SOLN
1.0000 mg | INTRAMUSCULAR | Status: AC | PRN
  Administered 2019-11-25 (×2): 1 mg via INTRAVENOUS
  Filled 2019-11-25 (×2): qty 1

## 2019-11-25 MED ORDER — ASPIRIN 81 MG PO CHEW
81.0000 mg | CHEWABLE_TABLET | ORAL | Status: AC
  Administered 2019-11-26: 81 mg via ORAL
  Filled 2019-11-25: qty 1

## 2019-11-25 MED ORDER — IPRATROPIUM-ALBUTEROL 0.5-2.5 (3) MG/3ML IN SOLN: 3.0000 mL | Freq: Four times a day (QID) | RESPIRATORY_TRACT | Status: DC | PRN

## 2019-11-25 MED ORDER — SODIUM CHLORIDE 0.9% FLUSH: 3.0000 mL | INTRAVENOUS | Status: DC | PRN

## 2019-11-25 MED ORDER — SPIRONOLACTONE 12.5 MG HALF TABLET
12.5000 mg | ORAL_TABLET | Freq: Every day | ORAL | Status: DC
  Administered 2019-11-25 – 2019-11-27 (×3): 12.5 mg via ORAL
  Filled 2019-11-25 (×3): qty 1

## 2019-11-25 MED ORDER — SODIUM CHLORIDE 0.9 % IV SOLN: 250.0000 mL | INTRAVENOUS | Status: DC | PRN

## 2019-11-25 MED ORDER — SODIUM CHLORIDE 0.9 % IV SOLN: INTRAVENOUS | Status: DC

## 2019-11-25 NOTE — Progress Notes (Signed)
  Echocardiogram 2D Echocardiogram has been performed.  Glenn Brandt 11/25/2019, 2:33 PM

## 2019-11-25 NOTE — Progress Notes (Signed)
Patient having 9/10 chest pain, stated it woke him up. Pt describes the pain as a burning sensation. Paged Cardiology, got orders for 1mg  iv dilaudid. EKG obtained  BP 132/89 HR 106 V paced, 98% 02 RA. Will continue to monitor patient.

## 2019-11-25 NOTE — Progress Notes (Signed)
Patient is still complaining of pain 7 out of 10, burning sensation, patient's chest was assessed to have warmth in the area that he is having pain. Jeannette Corpus, MD paged. Will continue to monitor.   Elaina Hoops, RN

## 2019-11-25 NOTE — H&P (View-Only) (Signed)
Progress Note  Patient Name: Glenn Brandt Date of Encounter: 11/25/2019  [facility] Cardiologist: New  Subjective   Some burning chest pain overnight now resolved  Inpatient Medications    Scheduled Meds: . azithromycin  250 mg Oral Daily  . colchicine  0.6 mg Oral BID  . ipratropium-albuterol  3 mL Nebulization TID  . losartan  12.5 mg Oral Daily  . pantoprazole  40 mg Oral BID  . PARoxetine  60 mg Oral QHS  . predniSONE  20 mg Oral BID WC  . simvastatin  10 mg Oral QHS  . sodium chloride flush  3 mL Intravenous Q12H  . umeclidinium bromide  1 puff Inhalation Daily   Continuous Infusions: . sodium chloride    . cefTRIAXone (ROCEPHIN)  IV 2 g (11/25/19 0442)   PRN Meds: sodium chloride, acetaminophen, albuterol, HYDROmorphone (DILAUDID) injection, menthol-cetylpyridinium, ondansetron (ZOFRAN) IV, sodium chloride flush   Vital Signs    Vitals:   11/25/19 0107 11/25/19 0124 11/25/19 0435 11/25/19 0741  BP: (!) 133/91 (!) 142/88 (!) 116/52 124/84  Pulse: (!) 101  88 88  Resp:    18  Temp:   98 F (36.7 C) 98 F (36.7 C)  TempSrc:   Oral Oral  SpO2: 100%  98% 98%  Weight:   88.6 kg   Height:        Intake/Output Summary (Last 24 hours) at 11/25/2019 0825 Last data filed at 11/25/2019 0800 Gross per 24 hour  Intake 700 ml  Output 650 ml  Net 50 ml   Last 3 Weights 11/25/2019 11/24/2019 11/16/2019  Weight (lbs) 195 lb 5.2 oz 195 lb 1.7 oz 198 lb 6.6 oz  Weight (kg) 88.6 kg 88.5 kg 90 kg      Telemetry    A sensed V paced - Personally Reviewed  ECG    n/a - Personally Reviewed  Physical Exam   GEN: No acute distress.   Neck: No JVD Cardiac: RRR, no murmurs, rubs, or gallops.  Respiratory: bilateral wheezing GI: Soft, nontender, non-distended  MS: No edema; No deformity. Neuro:  Nonfocal  Psych: Normal affect   Labs    High Sensitivity Troponin:   Recent Labs  Lab 11/16/19 0604 11/16/19 0852 11/16/19 1236 11/23/19 1721 11/23/19 1919    TROPONINIHS '9 10 12 '$ 19* 19*      Chemistry Recent Labs  Lab 11/23/19 1721 11/23/19 1721 11/23/19 1946 11/24/19 0109 11/24/19 0112 11/25/19 0537  NA 132*   < > 135 133*  --  134*  K 4.0   < > 4.7 5.3*  --  4.6  CL 100  --   --  101  --  101  CO2 20*  --   --  22  --  26  GLUCOSE 155*  --   --  179*  --  171*  BUN 8  --   --  10  --  14  CREATININE 0.93  --   --  1.07  --  0.85  CALCIUM 8.9  --   --  8.9  --  8.7*  PROT  --   --   --   --  7.0  --   ALBUMIN  --   --   --   --  3.0*  --   AST  --   --   --   --  18  --   ALT  --   --   --   --  14  --  ALKPHOS  --   --   --   --  34*  --   BILITOT  --   --   --   --  0.3  --   GFRNONAA >60  --   --  >60  --  >60  GFRAA >60  --   --  >60  --  >60  ANIONGAP 12  --   --  10  --  7   < > = values in this interval not displayed.     Hematology Recent Labs  Lab 11/23/19 1721 11/23/19 1946 11/24/19 0112  WBC 9.8  --  11.6*  RBC 4.45  --  4.50  HGB 12.3* 12.6* 12.3*  HCT 37.5* 37.0* 39.0  MCV 84.3  --  86.7  MCH 27.6  --  27.3  MCHC 32.8  --  31.5  RDW 13.2  --  13.2  PLT 335  --  338    BNP Recent Labs  Lab 11/23/19 1736  BNP 342.0*     DDimer No results for input(s): DDIMER in the last 168 hours.   Radiology    CT Angio Chest PE W and/or Wo Contrast  Result Date: 11/23/2019 CLINICAL DATA:  Dyspnea, chest pain EXAM: CT ANGIOGRAPHY CHEST WITH CONTRAST TECHNIQUE: Multidetector CT imaging of the chest was performed using the standard protocol during bolus administration of intravenous contrast. Multiplanar CT image reconstructions and MIPs were obtained to evaluate the vascular anatomy. CONTRAST:  134m OMNIPAQUE IOHEXOL 350 MG/ML SOLN COMPARISON:  11/16/2019 FINDINGS: Cardiovascular: Satisfactory opacification of the pulmonary arteries to the segmental level. No evidence of pulmonary embolism. Since the prior examination, there has developed a moderate pericardial effusion. The effusion demonstrates slightly  hyperdense fluid suggesting a a complex effusion with hemorrhagic or proteinaceous components. There is no CT evidence of cardiac tamponade. Global cardiac size is within normal limits and unchanged from prior examination, though left ventricular dilation is again noted. Pacemaker leads are seen within the right atrium and ventricle from a left subclavian dual lead pacemaker. The thoracic aorta is poorly opacified, but is unremarkable. Mediastinum/Nodes: There has developed progressive shoddy adenopathy within the a prevascular and right paratracheal lymph node groups without frank pathologic enlargement. These may be reactive or inflammatory in nature. Lungs/Pleura: Mild motion artifact. There is increasing collapse and consolidation of the left lower lobe small left pleural effusion has developed. Upper Abdomen: No acute abnormality. Musculoskeletal: No acute bone abnormality. Review of the MIP images confirms the above findings. IMPRESSION: Interval development of a moderate pericardial effusion. Pericardial fluid appears slightly complex suggesting a proteinaceous or hemorrhagic component and suggesting that this may be infectious or inflammatory in nature. No CT evidence of cardiac tamponade. Interval development of small left pleural effusion with increasing collapse of the left lower lobe. No pulmonary embolism. Electronically Signed   By: AFidela SalisburyMD   On: 11/23/2019 21:27   DG Chest Port 1 View  Result Date: 11/23/2019 CLINICAL DATA:  Shortness of breath. EXAM: PORTABLE CHEST 1 VIEW COMPARISON:  November 16, 2019. FINDINGS: Stable cardiomegaly. Left-sided pacemaker is unchanged in position. No pneumothorax or pleural effusion is noted. Mild bibasilar subsegmental atelectasis is noted. The visualized skeletal structures are unremarkable. IMPRESSION: Mild bibasilar subsegmental atelectasis. Electronically Signed   By: JMarijo ConceptionM.D.   On: 11/23/2019 18:22    Cardiac Studies     Patient  Profile      RMakya Brandt 59y.o.malewith CHB s/p dual chamber MDT  PPM 06/2019, HTN, and COPD who presents with chest pain.  Assessment & Plan    1. Pericardial effusion/Pericarditis - CT PE moderate effusion, Pericardial fluid appears slightly complex suggesting a proteinaceous or hemorrhagic component and suggesting that this may be infectious or inflammatory in nature.  - ESR 53, CRP 20. Trop 19 and flat. BNP 342   - started on colchcine, prednisone. Avoiding NSAIDs given systolic HF.   - from echo read last night Dr Sallyanne Kuster: LVEF 20-25%, grade II DDx, mod effusion. PACs complicate evaluation. No clear evidence of respiratory variation, no RA/RV collapse - no clinical signs of tamponade. Normal bp's, sats high 90s on 1 L [place]  - repeat limited echo today - mild flat trop would argue against an aggressive myocarditis  - with indication for LHC/RHC given his acute systolic HF, would also ask for evaluation for equalization of pressures given his efffusion. - if benign cath would plan for cardiac MRI   2. Acute systolic HF - echo LVEF 29-52%, new finding - BNP 343, chest imaging without significant edema - mild flat trop, no significant ventricular arrhyhmias would argue againt a severe aggressive myocarditis at this time. Initial concern given he presented with viral like symptoms and pericardial effusion with new onset HF.  - does not appear significantly volume overloaded by exam. Avoid aggreisvley dropping filling pressures with his pericardial effusion. Can follow up [place] pressures for guidance on diuresis.  - with active wheezing have not starte beta blocker. Started low dose losartan, titrate as tolerated. If tolerates can transition to entresto. Start aldaconte 12.'5mg'$  daily  - plan for [facility] tomorrow - if benign cath would plan for cardiac MRI  3. Cough/SOB/COPD exacerbation - elevated procalcitonin,  - COVID neg, flu neg, RSV neg,  - CT PE left pleural  effusion, some LLL atelectasis - WBC 9.8 on admission, uptrend in WBC after starting steroids - on azithro and ceftriaxone for possible pneumonia - wheezing on exam. On prednisone, nebs  - continue nebs, steroids, abx  4. Rash - continue to monitor, perhaps part of a potential viral syndrome.   For [facility] tomorrow, please also evaluate for equalization of pressures given his pericardial effusion   I have reviewed the risks, indications, and alternatives to cardiac catheterization, possible angioplasty, and stenting with the patient and his today. Risks include but are not limited to bleeding, infection, vascular injury, stroke, myocardial infection, arrhythmia, kidney injury, radiation-related injury in the case of prolonged fluoroscopy use, emergency cardiac surgery, and death. The patient understands the risks of serious complication is 1-2 in 8413 with diagnostic cardiac cath and 1-2% or less with angioplasty/stenting.    For questions or updates, please contact [place] Please consult [email or web address] for contact info under        Signed, Carlyle Dolly, MD  11/25/2019, 8:25 AM

## 2019-11-25 NOTE — Progress Notes (Signed)
Progress Note  Patient Name: Glenn Brandt Date of Encounter: 11/25/2019  [facility] Cardiologist: New  Subjective   Some burning chest pain overnight now resolved  Inpatient Medications    Scheduled Meds: . azithromycin  250 mg Oral Daily  . colchicine  0.6 mg Oral BID  . ipratropium-albuterol  3 mL Nebulization TID  . losartan  12.5 mg Oral Daily  . pantoprazole  40 mg Oral BID  . PARoxetine  60 mg Oral QHS  . predniSONE  20 mg Oral BID WC  . simvastatin  10 mg Oral QHS  . sodium chloride flush  3 mL Intravenous Q12H  . umeclidinium bromide  1 puff Inhalation Daily   Continuous Infusions: . sodium chloride    . cefTRIAXone (ROCEPHIN)  IV 2 g (11/25/19 0442)   PRN Meds: sodium chloride, acetaminophen, albuterol, HYDROmorphone (DILAUDID) injection, menthol-cetylpyridinium, ondansetron (ZOFRAN) IV, sodium chloride flush   Vital Signs    Vitals:   11/25/19 0107 11/25/19 0124 11/25/19 0435 11/25/19 0741  BP: (!) 133/91 (!) 142/88 (!) 116/52 124/84  Pulse: (!) 101  88 88  Resp:    18  Temp:   98 F (36.7 C) 98 F (36.7 C)  TempSrc:   Oral Oral  SpO2: 100%  98% 98%  Weight:   88.6 kg   Height:        Intake/Output Summary (Last 24 hours) at 11/25/2019 0825 Last data filed at 11/25/2019 0800 Gross per 24 hour  Intake 700 ml  Output 650 ml  Net 50 ml   Last 3 Weights 11/25/2019 11/24/2019 11/16/2019  Weight (lbs) 195 lb 5.2 oz 195 lb 1.7 oz 198 lb 6.6 oz  Weight (kg) 88.6 kg 88.5 kg 90 kg      Telemetry    A sensed V paced - Personally Reviewed  ECG    n/a - Personally Reviewed  Physical Exam   GEN: No acute distress.   Neck: No JVD Cardiac: RRR, no murmurs, rubs, or gallops.  Respiratory: bilateral wheezing GI: Soft, nontender, non-distended  MS: No edema; No deformity. Neuro:  Nonfocal  Psych: Normal affect   Labs    High Sensitivity Troponin:   Recent Labs  Lab 11/16/19 0604 11/16/19 0852 11/16/19 1236 11/23/19 1721 11/23/19 1919   TROPONINIHS '9 10 12 '$ 19* 19*      Chemistry Recent Labs  Lab 11/23/19 1721 11/23/19 1721 11/23/19 1946 11/24/19 0109 11/24/19 0112 11/25/19 0537  NA 132*   < > 135 133*  --  134*  K 4.0   < > 4.7 5.3*  --  4.6  CL 100  --   --  101  --  101  CO2 20*  --   --  22  --  26  GLUCOSE 155*  --   --  179*  --  171*  BUN 8  --   --  10  --  14  CREATININE 0.93  --   --  1.07  --  0.85  CALCIUM 8.9  --   --  8.9  --  8.7*  PROT  --   --   --   --  7.0  --   ALBUMIN  --   --   --   --  3.0*  --   AST  --   --   --   --  18  --   ALT  --   --   --   --  14  --  ALKPHOS  --   --   --   --  34*  --   BILITOT  --   --   --   --  0.3  --   GFRNONAA >60  --   --  >60  --  >60  GFRAA >60  --   --  >60  --  >60  ANIONGAP 12  --   --  10  --  7   < > = values in this interval not displayed.     Hematology Recent Labs  Lab 11/23/19 1721 11/23/19 1946 11/24/19 0112  WBC 9.8  --  11.6*  RBC 4.45  --  4.50  HGB 12.3* 12.6* 12.3*  HCT 37.5* 37.0* 39.0  MCV 84.3  --  86.7  MCH 27.6  --  27.3  MCHC 32.8  --  31.5  RDW 13.2  --  13.2  PLT 335  --  338    BNP Recent Labs  Lab 11/23/19 1736  BNP 342.0*     DDimer No results for input(s): DDIMER in the last 168 hours.   Radiology    CT Angio Chest PE W and/or Wo Contrast  Result Date: 11/23/2019 CLINICAL DATA:  Dyspnea, chest pain EXAM: CT ANGIOGRAPHY CHEST WITH CONTRAST TECHNIQUE: Multidetector CT imaging of the chest was performed using the standard protocol during bolus administration of intravenous contrast. Multiplanar CT image reconstructions and MIPs were obtained to evaluate the vascular anatomy. CONTRAST:  113m OMNIPAQUE IOHEXOL 350 MG/ML SOLN COMPARISON:  11/16/2019 FINDINGS: Cardiovascular: Satisfactory opacification of the pulmonary arteries to the segmental level. No evidence of pulmonary embolism. Since the prior examination, there has developed a moderate pericardial effusion. The effusion demonstrates slightly  hyperdense fluid suggesting a a complex effusion with hemorrhagic or proteinaceous components. There is no CT evidence of cardiac tamponade. Global cardiac size is within normal limits and unchanged from prior examination, though left ventricular dilation is again noted. Pacemaker leads are seen within the right atrium and ventricle from a left subclavian dual lead pacemaker. The thoracic aorta is poorly opacified, but is unremarkable. Mediastinum/Nodes: There has developed progressive shoddy adenopathy within the a prevascular and right paratracheal lymph node groups without frank pathologic enlargement. These may be reactive or inflammatory in nature. Lungs/Pleura: Mild motion artifact. There is increasing collapse and consolidation of the left lower lobe small left pleural effusion has developed. Upper Abdomen: No acute abnormality. Musculoskeletal: No acute bone abnormality. Review of the MIP images confirms the above findings. IMPRESSION: Interval development of a moderate pericardial effusion. Pericardial fluid appears slightly complex suggesting a proteinaceous or hemorrhagic component and suggesting that this may be infectious or inflammatory in nature. No CT evidence of cardiac tamponade. Interval development of small left pleural effusion with increasing collapse of the left lower lobe. No pulmonary embolism. Electronically Signed   By: AFidela SalisburyMD   On: 11/23/2019 21:27   DG Chest Port 1 View  Result Date: 11/23/2019 CLINICAL DATA:  Shortness of breath. EXAM: PORTABLE CHEST 1 VIEW COMPARISON:  November 16, 2019. FINDINGS: Stable cardiomegaly. Left-sided pacemaker is unchanged in position. No pneumothorax or pleural effusion is noted. Mild bibasilar subsegmental atelectasis is noted. The visualized skeletal structures are unremarkable. IMPRESSION: Mild bibasilar subsegmental atelectasis. Electronically Signed   By: JMarijo ConceptionM.D.   On: 11/23/2019 18:22    Cardiac Studies     Patient  Profile      REwart Brandt 59y.o.malewith CHB s/p dual chamber MDT  PPM 06/2019, HTN, and COPD who presents with chest pain.  Assessment & Plan    1. Pericardial effusion/Pericarditis - CT PE moderate effusion, Pericardial fluid appears slightly complex suggesting a proteinaceous or hemorrhagic component and suggesting that this may be infectious or inflammatory in nature.  - ESR 53, CRP 20. Trop 19 and flat. BNP 342   - started on colchcine, prednisone. Avoiding NSAIDs given systolic HF.   - from echo read last night Dr Sallyanne Kuster: LVEF 20-25%, grade II DDx, mod effusion. PACs complicate evaluation. No clear evidence of respiratory variation, no RA/RV collapse - no clinical signs of tamponade. Normal bp's, sats high 90s on 1 L [place]  - repeat limited echo today - mild flat trop would argue against an aggressive myocarditis  - with indication for LHC/RHC given his acute systolic HF, would also ask for evaluation for equalization of pressures given his efffusion. - if benign cath would plan for cardiac MRI   2. Acute systolic HF - echo LVEF 62-37%, new finding - BNP 343, chest imaging without significant edema - mild flat trop, no significant ventricular arrhyhmias would argue againt a severe aggressive myocarditis at this time. Initial concern given he presented with viral like symptoms and pericardial effusion with new onset HF.  - does not appear significantly volume overloaded by exam. Avoid aggreisvley dropping filling pressures with his pericardial effusion. Can follow up [place] pressures for guidance on diuresis.  - with active wheezing have not starte beta blocker. Started low dose losartan, titrate as tolerated. If tolerates can transition to entresto. Start aldaconte 12.'5mg'$  daily  - plan for [facility] tomorrow - if benign cath would plan for cardiac MRI  3. Cough/SOB/COPD exacerbation - elevated procalcitonin,  - COVID neg, flu neg, RSV neg,  - CT PE left pleural  effusion, some LLL atelectasis - WBC 9.8 on admission, uptrend in WBC after starting steroids - on azithro and ceftriaxone for possible pneumonia - wheezing on exam. On prednisone, nebs  - continue nebs, steroids, abx  4. Rash - continue to monitor, perhaps part of a potential viral syndrome.   For [facility] tomorrow, please also evaluate for equalization of pressures given his pericardial effusion   I have reviewed the risks, indications, and alternatives to cardiac catheterization, possible angioplasty, and stenting with the patient and his today. Risks include but are not limited to bleeding, infection, vascular injury, stroke, myocardial infection, arrhythmia, kidney injury, radiation-related injury in the case of prolonged fluoroscopy use, emergency cardiac surgery, and death. The patient understands the risks of serious complication is 1-2 in [id number] with diagnostic cardiac cath and 1-2% or less with angioplasty/stenting.    For questions or updates, please contact [place] Please consult [email or web address] for contact info under        Signed, Carlyle Dolly, MD  11/25/2019, 8:25 AM

## 2019-11-25 NOTE — Plan of Care (Signed)
  Problem: Education: Goal: Knowledge of General Education information will improve Description: Including pain rating scale, medication(s)/side effects and non-pharmacologic comfort measures Outcome: Progressing   Problem: Health Behavior/Discharge Planning: Goal: Ability to manage health-related needs will improve Outcome: Progressing   Problem: Clinical Measurements: Goal: Ability to maintain clinical measurements within normal limits will improve Outcome: Progressing Goal: Will remain free from infection Outcome: Progressing Goal: Diagnostic test results will improve Outcome: Progressing Goal: Respiratory complications will improve Outcome: Not Progressing  Pt has coughing spells & cough is productive at times. Goal: Cardiovascular complication will be avoided Outcome: Progressing   Problem: Activity: Goal: Risk for activity intolerance will decrease Outcome: Progressing   Problem: Nutrition: Goal: Adequate nutrition will be maintained Outcome: Progressing   Problem: Coping: Goal: Level of anxiety will decrease Outcome: Progressing   Problem: Elimination: Goal: Will not experience complications related to bowel motility Outcome: Progressing Goal: Will not experience complications related to urinary retention Outcome: Progressing   Problem: Pain Managment: Goal: General experience of comfort will improve Outcome: Progressing   Problem: Safety: Goal: Ability to remain free from injury will improve Outcome: Progressing

## 2019-11-25 NOTE — Progress Notes (Signed)
Cardiologist on call notified of pt having 8/10 non-radiating mid sternal cp similar to last night. Pt given PRN hydromorphone. Will continue to monitor the pt. Hoover Brunette, RN

## 2019-11-26 ENCOUNTER — Encounter (HOSPITAL_COMMUNITY): Payer: Self-pay | Admitting: Cardiology

## 2019-11-26 ENCOUNTER — Other Ambulatory Visit: Payer: Self-pay

## 2019-11-26 ENCOUNTER — Encounter (HOSPITAL_COMMUNITY)
Admission: EM | Disposition: A | Discharge status: Home / Self Care | Payer: Self-pay | Source: Home / Self Care | Attending: Cardiovascular Disease

## 2019-11-26 DIAGNOSIS — I5043 Acute on chronic combined systolic (congestive) and diastolic (congestive) heart failure: Secondary | ICD-10-CM

## 2019-11-26 DIAGNOSIS — I509 Heart failure, unspecified: Secondary | ICD-10-CM

## 2019-11-26 DIAGNOSIS — I429 Cardiomyopathy, unspecified: Secondary | ICD-10-CM

## 2019-11-26 HISTORY — PX: RIGHT/LEFT HEART CATH AND CORONARY ANGIOGRAPHY: CATH118266

## 2019-11-26 LAB — CBC
HCT: 34.9 % — ABNORMAL LOW (ref 39.0–52.0)
Hemoglobin: 11.4 g/dL — ABNORMAL LOW (ref 13.0–17.0)
MCH: 28.4 pg (ref 26.0–34.0)
MCHC: 32.7 g/dL (ref 30.0–36.0)
MCV: 86.8 fL (ref 80.0–100.0)
Platelets: 443 10*3/uL — ABNORMAL HIGH (ref 150–400)
RBC: 4.02 MIL/uL — ABNORMAL LOW (ref 4.22–5.81)
RDW: 13.5 % (ref 11.5–15.5)
WBC: 9.8 10*3/uL (ref 4.0–10.5)
nRBC: 0 % (ref 0.0–0.2)

## 2019-11-26 LAB — POCT I-STAT EG7
Acid-Base Excess: 3 mmol/L — ABNORMAL HIGH (ref 0.0–2.0)
Acid-Base Excess: 4 mmol/L — ABNORMAL HIGH (ref 0.0–2.0)
Bicarbonate: 29.6 mmol/L — ABNORMAL HIGH (ref 20.0–28.0)
Bicarbonate: 30.1 mmol/L — ABNORMAL HIGH (ref 20.0–28.0)
Calcium, Ion: 1.24 mmol/L (ref 1.15–1.40)
Calcium, Ion: 1.24 mmol/L (ref 1.15–1.40)
HCT: 34 % — ABNORMAL LOW (ref 39.0–52.0)
HCT: 34 % — ABNORMAL LOW (ref 39.0–52.0)
Hemoglobin: 11.6 g/dL — ABNORMAL LOW (ref 13.0–17.0)
Hemoglobin: 11.6 g/dL — ABNORMAL LOW (ref 13.0–17.0)
O2 Saturation: 75 %
O2 Saturation: 76 %
Potassium: 4.1 mmol/L (ref 3.5–5.1)
Potassium: 4.2 mmol/L (ref 3.5–5.1)
Sodium: 141 mmol/L (ref 135–145)
Sodium: 141 mmol/L (ref 135–145)
TCO2: 31 mmol/L (ref 22–32)
TCO2: 32 mmol/L (ref 22–32)
pCO2, Ven: 51.3 mmHg (ref 44.0–60.0)
pCO2, Ven: 51.8 mmHg (ref 44.0–60.0)
pH, Ven: 7.369 (ref 7.250–7.430)
pH, Ven: 7.372 (ref 7.250–7.430)
pO2, Ven: 42 mmHg (ref 32.0–45.0)
pO2, Ven: 43 mmHg (ref 32.0–45.0)

## 2019-11-26 LAB — BASIC METABOLIC PANEL
Anion gap: 7 (ref 5–15)
BUN: 10 mg/dL (ref 8–23)
CO2: 26 mmol/L (ref 22–32)
Calcium: 8.6 mg/dL — ABNORMAL LOW (ref 8.9–10.3)
Chloride: 103 mmol/L (ref 98–111)
Creatinine, Ser: 0.82 mg/dL (ref 0.61–1.24)
GFR calc Af Amer: >60 mL/min (ref >60)
GFR calc non Af Amer: >60 mL/min (ref >60)
Glucose, Bld: 141 mg/dL — ABNORMAL HIGH (ref 70–99)
Potassium: 4.4 mmol/L (ref 3.5–5.1)
Sodium: 136 mmol/L (ref 135–145)

## 2019-11-26 LAB — LIPID PANEL
Cholesterol: 157 mg/dL (ref 0–200)
HDL: 28 mg/dL — ABNORMAL LOW (ref >40)
LDL Cholesterol: 111 mg/dL — ABNORMAL HIGH (ref 0–99)
Total CHOL/HDL Ratio: 5.6 RATIO
Triglycerides: 92 mg/dL (ref <150)
VLDL: 18 mg/dL (ref 0–40)

## 2019-11-26 LAB — TSH: TSH: 0.334 u[IU]/mL — ABNORMAL LOW (ref 0.350–4.500)

## 2019-11-26 LAB — ECHOCARDIOGRAM COMPLETE
Area-P 1/2: 2.6 cm2
S' Lateral: 5.4 cm

## 2019-11-26 LAB — T4, FREE: Free T4: 1.05 ng/dL (ref 0.61–1.12)

## 2019-11-26 SURGERY — RIGHT/LEFT HEART CATH AND CORONARY ANGIOGRAPHY
Anesthesia: LOCAL

## 2019-11-26 MED ORDER — LIDOCAINE HCL (PF) 1 % IJ SOLN
INTRAMUSCULAR | Status: DC | PRN
  Administered 2019-11-26: 2 mL
  Administered 2019-11-26: 1 mL

## 2019-11-26 MED ORDER — IOHEXOL 350 MG/ML SOLN
INTRAVENOUS | Status: DC | PRN
  Administered 2019-11-26: 50 mL

## 2019-11-26 MED ORDER — FENTANYL CITRATE (PF) 100 MCG/2ML IJ SOLN
INTRAMUSCULAR | Status: AC
  Filled 2019-11-26: qty 2

## 2019-11-26 MED ORDER — LIDOCAINE HCL (PF) 1 % IJ SOLN
INTRAMUSCULAR | Status: AC
  Filled 2019-11-26: qty 30

## 2019-11-26 MED ORDER — SODIUM CHLORIDE 0.9 % IV SOLN: INTRAVENOUS | Status: AC

## 2019-11-26 MED ORDER — SODIUM CHLORIDE 0.9% FLUSH
3.0000 mL | Freq: Two times a day (BID) | INTRAVENOUS | Status: DC
  Administered 2019-11-26 – 2019-11-29 (×3): 3 mL via INTRAVENOUS

## 2019-11-26 MED ORDER — HYDRALAZINE HCL 20 MG/ML IJ SOLN: 10.0000 mg | INTRAMUSCULAR | Status: AC | PRN

## 2019-11-26 MED ORDER — HEPARIN (PORCINE) IN NACL 1000-0.9 UT/500ML-% IV SOLN
INTRAVENOUS | Status: AC
  Filled 2019-11-26: qty 1000

## 2019-11-26 MED ORDER — LABETALOL HCL 5 MG/ML IV SOLN: 10.0000 mg | INTRAVENOUS | Status: AC | PRN

## 2019-11-26 MED ORDER — SODIUM CHLORIDE 0.9 % IV SOLN: 250.0000 mL | INTRAVENOUS | Status: DC | PRN

## 2019-11-26 MED ORDER — MIDAZOLAM HCL 2 MG/2ML IJ SOLN
INTRAMUSCULAR | Status: DC | PRN
  Administered 2019-11-26: 1 mg via INTRAVENOUS

## 2019-11-26 MED ORDER — SACUBITRIL-VALSARTAN 24-26 MG PO TABS
1.0000 | ORAL_TABLET | Freq: Two times a day (BID) | ORAL | Status: DC
  Administered 2019-11-26 – 2019-11-29 (×7): 1 via ORAL
  Filled 2019-11-26 (×7): qty 1

## 2019-11-26 MED ORDER — HEPARIN SODIUM (PORCINE) 1000 UNIT/ML IJ SOLN
INTRAMUSCULAR | Status: AC
  Filled 2019-11-26: qty 1

## 2019-11-26 MED ORDER — VERAPAMIL HCL 2.5 MG/ML IV SOLN
INTRAVENOUS | Status: AC
  Filled 2019-11-26: qty 2

## 2019-11-26 MED ORDER — ENOXAPARIN SODIUM 40 MG/0.4ML ~~LOC~~ SOLN
40.0000 mg | SUBCUTANEOUS | Status: DC
  Administered 2019-11-27 – 2019-11-29 (×3): 40 mg via SUBCUTANEOUS
  Filled 2019-11-26 (×3): qty 0.4

## 2019-11-26 MED ORDER — HEPARIN SODIUM (PORCINE) 1000 UNIT/ML IJ SOLN
INTRAMUSCULAR | Status: DC | PRN
  Administered 2019-11-26: 4000 [IU] via INTRAVENOUS

## 2019-11-26 MED ORDER — ONDANSETRON HCL 4 MG/2ML IJ SOLN: 4.0000 mg | Freq: Four times a day (QID) | INTRAMUSCULAR | Status: DC | PRN

## 2019-11-26 MED ORDER — ACETAMINOPHEN 325 MG PO TABS
650.0000 mg | ORAL_TABLET | ORAL | Status: DC | PRN
  Administered 2019-11-27: 650 mg via ORAL
  Filled 2019-11-26: qty 2

## 2019-11-26 MED ORDER — HEPARIN (PORCINE) IN NACL 1000-0.9 UT/500ML-% IV SOLN
INTRAVENOUS | Status: DC | PRN
  Administered 2019-11-26 (×2): 500 mL

## 2019-11-26 MED ORDER — FENTANYL CITRATE (PF) 100 MCG/2ML IJ SOLN
INTRAMUSCULAR | Status: DC | PRN
  Administered 2019-11-26 (×2): 25 ug via INTRAVENOUS

## 2019-11-26 MED ORDER — SODIUM CHLORIDE 0.9% FLUSH: 3.0000 mL | INTRAVENOUS | Status: DC | PRN

## 2019-11-26 MED ORDER — VERAPAMIL HCL 2.5 MG/ML IV SOLN
INTRAVENOUS | Status: DC | PRN
  Administered 2019-11-26: 10 mL via INTRA_ARTERIAL

## 2019-11-26 MED ORDER — MIDAZOLAM HCL 2 MG/2ML IJ SOLN
INTRAMUSCULAR | Status: AC
  Filled 2019-11-26: qty 2

## 2019-11-26 SURGICAL SUPPLY — 14 items
CATH 5FR JL3.5 JR4 ANG PIG MP (CATHETERS) ×1 IMPLANT
CATH SWAN GANZ 7F STRAIGHT (CATHETERS) ×1 IMPLANT
DEVICE RAD COMP TR BAND LRG (VASCULAR PRODUCTS) ×1 IMPLANT
GLIDESHEATH SLEND SS 6F .021 (SHEATH) ×1 IMPLANT
GLIDESHEATH SLENDER 7FR .021G (SHEATH) ×2 IMPLANT
GUIDEWIRE .025 260CM (WIRE) ×1 IMPLANT
GUIDEWIRE INQWIRE 1.5J.035X260 (WIRE) IMPLANT
INQWIRE 1.5J .035X260CM (WIRE) ×2
KIT HEART LEFT (KITS) ×2 IMPLANT
PACK CARDIAC CATHETERIZATION (CUSTOM PROCEDURE TRAY) ×2 IMPLANT
SHEATH PROBE COVER 6X72 (BAG) ×1 IMPLANT
TRANSDUCER W/STOPCOCK (MISCELLANEOUS) ×3 IMPLANT
TUBING ART PRESS 72  MALE/FEM (TUBING) ×1
TUBING ART PRESS 72 MALE/FEM (TUBING) IMPLANT
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
## 2019-11-26 NOTE — Progress Notes (Addendum)
Progress Note  Patient Name: Glenn Brandt Date of Encounter: 11/26/2019  Primary Cardiologist: New to [facility], general cardiology  Subjective   Feeling okay this morning. Some aching in his chest prior to going to sleep last night. Still coughing some but breathing has improved since admission. No complaints of LE edema.   Inpatient Medications    Scheduled Meds: . azithromycin  250 mg Oral Daily  . colchicine  0.6 mg Oral BID  . ipratropium-albuterol  3 mL Nebulization TID  . losartan  12.5 mg Oral Daily  . pantoprazole  40 mg Oral BID  . PARoxetine  60 mg Oral QHS  . predniSONE  20 mg Oral BID WC  . simvastatin  10 mg Oral QHS  . sodium chloride flush  3 mL Intravenous Q12H  . sodium chloride flush  3 mL Intravenous Q12H  . spironolactone  12.5 mg Oral Daily  . umeclidinium bromide  1 puff Inhalation Daily   Continuous Infusions: . sodium chloride    . sodium chloride    . sodium chloride 10 mL/hr at 11/26/19 0611  . cefTRIAXone (ROCEPHIN)  IV 2 g (11/26/19 0407)   PRN Meds: sodium chloride, sodium chloride, acetaminophen, albuterol, HYDROmorphone (DILAUDID) injection, ipratropium-albuterol, menthol-cetylpyridinium, ondansetron (ZOFRAN) IV, sodium chloride flush, sodium chloride flush   Vital Signs    Vitals:   11/26/19 0005 11/26/19 0125 11/26/19 0417 11/26/19 0734  BP:  113/77 117/87   Pulse: 98 87 85   Resp:  16    Temp:  97.7 F (36.5 C) 97.9 F (36.6 C)   TempSrc:  Oral Oral   SpO2: 96% 96% 98% 97%  Weight:   85.7 kg   Height:        Intake/Output Summary (Last 24 hours) at 11/26/2019 0808 Last data filed at 11/26/2019 0439 Gross per 24 hour  Intake 240 ml  Output 1050 ml  Net -810 ml   Filed Weights   11/24/19 0044 11/25/19 0435 11/26/19 0417  Weight: 88.5 kg 88.6 kg 85.7 kg    Telemetry    Paced rhythm with occasional tachycardia to the 120s. - Personally Reviewed  ECG    No new tracings - Personally Reviewed  Physical Exam    GEN: No acute distress.   Neck: No JVD, no carotid bruits Cardiac: RRR, no murmurs, rubs, or gallops.  Respiratory: Clear to auscultation bilaterally, no wheezes/ rales/ rhonchi GI: NABS, Soft, nontender, non-distended  MS: No edema; No deformity. Neuro:  Nonfocal, moving all extremities spontaneously Psych: Normal affect   Labs    Chemistry Recent Labs  Lab 11/24/19 0109 11/24/19 0112 11/25/19 0537 11/26/19 0517  NA 133*  --  134* 136  K 5.3*  --  4.6 4.4  CL 101  --  101 103  CO2 22  --  26 26  GLUCOSE 179*  --  171* 141*  BUN 10  --  14 10  CREATININE 1.07  --  0.85 0.82  CALCIUM 8.9  --  8.7* 8.6*  PROT  --  7.0  --   --   ALBUMIN  --  3.0*  --   --   AST  --  18  --   --   ALT  --  14  --   --   ALKPHOS  --  34*  --   --   BILITOT  --  0.3  --   --   GFRNONAA >60  --  >60 >60  GFRAA >60  --  >  60 >60  ANIONGAP 10  --  7 7     Hematology Recent Labs  Lab 11/23/19 1721 11/23/19 1721 11/23/19 1946 11/24/19 0112 11/26/19 0517  WBC 9.8  --   --  11.6* 9.8  RBC 4.45  --   --  4.50 4.02*  HGB 12.3*   < > 12.6* 12.3* 11.4*  HCT 37.5*   < > 37.0* 39.0 34.9*  MCV 84.3  --   --  86.7 86.8  MCH 27.6  --   --  27.3 28.4  MCHC 32.8  --   --  31.5 32.7  RDW 13.2  --   --  13.2 13.5  PLT 335  --   --  338 443*   < > = values in this interval not displayed.    Cardiac EnzymesNo results for input(s): TROPONINI in the last 168 hours. No results for input(s): TROPIPOC in the last 168 hours.   BNP Recent Labs  Lab 11/23/19 1736  BNP 342.0*     DDimer No results for input(s): DDIMER in the last 168 hours.   Radiology    ECHOCARDIOGRAM LIMITED  Result Date: 11/25/2019    ECHOCARDIOGRAM LIMITED REPORT   Patient Name:   Glenn Brandt Date of Exam: 11/25/2019 Medical Rec #:  [id number]      Height:       71.0 in Accession #:    [id number]     Weight:       195.3 lb Date of Birth:  [birth year]      BSA:          2.087 m Patient Age:    59 years       BP:            110/79 mmHg Patient Gender: M              HR:           97 bpm. Exam Location:  Inpatient Procedure: Limited Color Doppler, Cardiac Doppler and Limited Echo Indications:    pericardial effusion 423.9  History:        Patient has prior history of Echocardiogram examinations, most                 recent 11/24/2019. Pericarditis, COPD; Risk Factors:Hypertension.  Sonographer:    Johny Chess Referring Phys: [id number] [place]  1. Severe LV dysfunction. Except for basal lateral and base/mid anterior segments, all other segments are severely hypokinetic/akinetic. Compared to echo from Feb 2021, these findings are new.. Left ventricular ejection fraction, by estimation, is <20%.  The left ventricle has severely decreased function. The left ventricle demonstrates regional wall motion abnormalities (see scoring diagram/findings for description). The left ventricular internal cavity size was severely dilated. There is mild left ventricular hypertrophy.  2. Right ventricular systolic function is moderately reduced. The right ventricular size is normal. There is normal pulmonary artery systolic pressure.  3. Small to moderate pericardial effusion surrounds heart, largest around RA/RV. No evidence for RV/RA collapse, doe not meet echo criteria for tamponade.     Large pleural effusion,. Moderate pericardial effusion. Large pleural effusion in the left lateral region.  4. The mitral valve is normal in structure. Mild mitral valve regurgitation.  5. The aortic valve is normal in structure. Aortic valve regurgitation is trivial.  6. The inferior vena cava is normal in size with <50% respiratory variability, suggesting right atrial pressure of 8 mmHg. FINDINGS  Left Ventricle: Severe LV dysfunction. Except  for basal lateral and base/mid anterior segments, all other segments are severely hypokinetic/akinetic. Compared to echo from Feb 2021, these findings are new. Left ventricular ejection fraction, by  estimation, is <20%. The left ventricle has severely decreased function. The left ventricle demonstrates regional wall motion abnormalities. The left ventricular internal cavity size was severely dilated. There is mild left ventricular hypertrophy. Right Ventricle: The right ventricular size is normal. Right ventricular systolic function is moderately reduced. There is normal pulmonary artery systolic pressure. The tricuspid regurgitant velocity is 1.97 m/s, and with an assumed right atrial pressure of 3 mmHg, the estimated right ventricular systolic pressure is 53.6 mmHg. Pericardium: Small to moderate pericardial effusion surrounds heart, largest around RA/RV. No evidence for RV/RA collapse, doe not meet echo criteria for tamponade. Large pleural effusion,. A moderately sized pericardial effusion is present. Mitral Valve: The mitral valve is normal in structure. Mild mitral valve regurgitation. Tricuspid Valve: The tricuspid valve is normal in structure. Tricuspid valve regurgitation is trivial. Aortic Valve: The aortic valve is normal in structure. Aortic valve regurgitation is trivial. Pulmonic Valve: The pulmonic valve was normal in structure. Pulmonic valve regurgitation is not visualized. Venous: The inferior vena cava is normal in size with less than 50% respiratory variability, suggesting right atrial pressure of 8 mmHg. Additional Comments: A pacer wire is visualized. There is a large pleural effusion in the left lateral region. LEFT VENTRICLE PLAX 2D LVIDd:         6.40 cm LVIDs:         5.10 cm LV PW:         1.10 cm LV IVS:        1.20 cm  IVC IVC diam: 1.60 cm LEFT ATRIUM         Index LA diam:    4.10 cm 1.96 cm/m  AORTIC VALVE LVOT Vmax:   111.00 cm/s LVOT Vmean:  66.100 cm/s LVOT VTI:    0.183 m TRICUSPID VALVE TR Peak grad:   15.5 mmHg TR Vmax:        197.00 cm/s  SHUNTS Systemic VTI: 0.18 m Dorris Carnes MD Electronically signed by Dorris Carnes MD Signature Date/Time: 11/25/2019/3:27:37 PM    Final      Cardiac Studies   Echocardiogram 11/25/19: 1. Severe LV dysfunction. Except for basal lateral and base/mid anterior  segments, all other segments are severely hypokinetic/akinetic. Compared  to echo from Feb 2021, these findings are new.. Left ventricular ejection  fraction, by estimation, is <20%.  The left ventricle has severely decreased function. The left ventricle  demonstrates regional wall motion abnormalities (see scoring  diagram/findings for description). The left ventricular internal cavity  size was severely dilated. There is mild left  ventricular hypertrophy.  2. Right ventricular systolic function is moderately reduced. The right  ventricular size is normal. There is normal pulmonary artery systolic  pressure.  3. Small to moderate pericardial effusion surrounds heart, largest around  RA/RV. No evidence for RV/RA collapse, doe not meet echo criteria for  tamponade.   Large pleural effusion,. Moderate pericardial effusion. Large pleural  effusion in the left lateral region.  4. The mitral valve is normal in structure. Mild mitral valve  regurgitation.  5. The aortic valve is normal in structure. Aortic valve regurgitation is  trivial.  6. The inferior vena cava is normal in size with <50% respiratory  variability, suggesting right atrial pressure of 8 mmHg.   Patient Profile     59 y.o. 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 Pericardial effusion/pericarditis: found to have a pericardial effusion with complex fluid suggestive of proteinaceous material pm on imaging this admission. ESR/CRP elevated. Echo with EF <20%, with small to moderate pericardial effusion without evidence of tamponade. Etiology unclear though suspect infectious vs inflammatory in nature. Started on colchicine and prednisone. - Await R/LHC today to assess equalization of pressures - if benign, will plan for a  cardiac MRI.   2. Acute systolic CHF: Echo this admission revealed EF 20% with RWMA noted above. Prior to this his echo 06/2019 showed normal LV function. BNP 343. Chest imaging without significant edema. HsTrop with low flat trend. Possible viral etiology given recent URI symptoms and pericardial effusion. BBlocker not initiated given wheezing on exam. Started on low dose losartan and spironolactone - Await R/LHC today - if benign, will plan for a cardiac MRI - Continue losartan for now with plans to transition to entresto as tolerated - Continue spironolactone  3. Cough/COPD exacerbation: procalcitonin elevated on admission and trended down. COVID neg, RVP negative. CT PE with left pleural effusion and LLL atelectasis. Started on azithro/CTX - Continue antibiotics - day 3/5 - Continue supportive care with prn nebulizers  4. HTN: BP stable - Managed in the context of #2  5. HLD: No recent lipids - Will add on FLP to AM labs - Continue simvastatin  6. Rash: possible this is part of a viral syndrome - Continue to monitor  7. Low TSH: mildly low on labs this morning - Will check FT4   For questions or updates, please contact [facility] Please consult [email or web address] for contact info under Cardiology/STEMI.      Signed, Abigail Butts, PA-C  11/26/2019, 8:08 AM   [phone]  Please see CHF consult note.   Loralie Champagne 11/26/2019 10:15 AM

## 2019-11-26 NOTE — Interval H&P Note (Signed)
History and Physical Interval Note:  11/26/2019 9:23 AM  Glenn Brandt  has presented today for surgery, with the diagnosis of unstable angina.  The various methods of treatment have been discussed with the patient and family. After consideration of risks, benefits and other options for treatment, the patient has consented to  Procedure(s): RIGHT/LEFT HEART CATH AND CORONARY ANGIOGRAPHY (N/A) as a surgical intervention.  The patient's history has been reviewed, patient examined, no change in status, stable for surgery.  I have reviewed the patient's chart and labs.  Questions were answered to the patient's satisfaction.     [name]

## 2019-11-27 ENCOUNTER — Inpatient Hospital Stay (HOSPITAL_COMMUNITY): Payer: No Typology Code available for payment source

## 2019-11-27 DIAGNOSIS — I5021 Acute systolic (congestive) heart failure: Secondary | ICD-10-CM

## 2019-11-27 LAB — CBC
HCT: 40.3 % (ref 39.0–52.0)
Hemoglobin: 13.1 g/dL (ref 13.0–17.0)
MCH: 28 pg (ref 26.0–34.0)
MCHC: 32.5 g/dL (ref 30.0–36.0)
MCV: 86.1 fL (ref 80.0–100.0)
Platelets: 554 10*3/uL — ABNORMAL HIGH (ref 150–400)
RBC: 4.68 MIL/uL (ref 4.22–5.81)
RDW: 13.4 % (ref 11.5–15.5)
WBC: 9.6 10*3/uL (ref 4.0–10.5)
nRBC: 0 % (ref 0.0–0.2)

## 2019-11-27 LAB — BASIC METABOLIC PANEL
Anion gap: 9 (ref 5–15)
BUN: 8 mg/dL (ref 8–23)
CO2: 25 mmol/L (ref 22–32)
Calcium: 8.7 mg/dL — ABNORMAL LOW (ref 8.9–10.3)
Chloride: 105 mmol/L (ref 98–111)
Creatinine, Ser: 0.87 mg/dL (ref 0.61–1.24)
GFR calc Af Amer: >60 mL/min (ref >60)
GFR calc non Af Amer: >60 mL/min (ref >60)
Glucose, Bld: 139 mg/dL — ABNORMAL HIGH (ref 70–99)
Potassium: 4.2 mmol/L (ref 3.5–5.1)
Sodium: 139 mmol/L (ref 135–145)

## 2019-11-27 MED ORDER — FUROSEMIDE 20 MG PO TABS
20.0000 mg | ORAL_TABLET | Freq: Every day | ORAL | Status: DC
  Administered 2019-11-27 – 2019-11-29 (×3): 20 mg via ORAL
  Filled 2019-11-27 (×3): qty 1

## 2019-11-27 MED ORDER — GADOBUTROL 1 MMOL/ML IV SOLN
10.0000 mL | Freq: Once | INTRAVENOUS | Status: AC | PRN
  Administered 2019-11-27: 10 mL via INTRAVENOUS

## 2019-11-27 MED ORDER — PREDNISONE 10 MG PO TABS
10.0000 mg | ORAL_TABLET | Freq: Every day | ORAL | Status: DC
  Administered 2019-11-28 – 2019-11-29 (×2): 10 mg via ORAL
  Filled 2019-11-27 (×3): qty 1

## 2019-11-27 MED ORDER — BISOPROLOL FUMARATE 5 MG PO TABS
2.5000 mg | ORAL_TABLET | Freq: Every day | ORAL | Status: DC
  Administered 2019-11-27 – 2019-11-29 (×3): 2.5 mg via ORAL
  Filled 2019-11-27 (×3): qty 1

## 2019-11-27 MED ORDER — SPIRONOLACTONE 25 MG PO TABS
25.0000 mg | ORAL_TABLET | Freq: Every day | ORAL | Status: DC
  Administered 2019-11-28 – 2019-11-29 (×2): 25 mg via ORAL
  Filled 2019-11-27 (×3): qty 1

## 2019-11-27 MED ORDER — SPIRONOLACTONE 12.5 MG HALF TABLET
12.5000 mg | ORAL_TABLET | Freq: Once | ORAL | Status: AC
  Administered 2019-11-27: 12.5 mg via ORAL
  Filled 2019-11-27: qty 1

## 2019-11-27 MED ORDER — HYDROCODONE-ACETAMINOPHEN 5-325 MG PO TABS
1.0000 | ORAL_TABLET | Freq: Once | ORAL | Status: AC
  Administered 2019-11-27: 1 via ORAL
  Filled 2019-11-27: qty 1

## 2019-11-27 NOTE — Progress Notes (Signed)
Patient ID: Glenn Brandt, male   DOB: Nov 14, 1960, 59 y.o.   MRN: [id number]     Advanced Heart Failure Rounding Note  PCP-Cardiologist: No primary care provider on file.   Subjective:    Feels good today, no chest pain.  No dyspnea.  BP stable on cardiac meds.    Objective:   Weight Range: 84.4 kg Body mass index is 25.96 kg/m.   Vital Signs:   Temp:  [98 F (36.7 C)-98.9 F (37.2 C)] 98.6 F (37 C) (07/20 0809) Pulse Rate:  [86-102] 88 (07/20 0809) Resp:  [15-18] 18 (07/20 0809) BP: (107-130)/(68-81) 115/80 (07/20 0809) SpO2:  [93 %-99 %] 97 % (07/20 0810) Weight:  [84.4 kg] 84.4 kg (07/20 0533) Last BM Date: 11/26/19  Weight change: Filed Weights   11/25/19 0435 11/26/19 0417 11/27/19 0533  Weight: 88.6 kg 85.7 kg 84.4 kg    Intake/Output:   Intake/Output Summary (Last 24 hours) at 11/27/2019 0940 Last data filed at 11/27/2019 0600 Gross per 24 hour  Intake 688.44 ml  Output 1575 ml  Net -886.56 ml      Physical Exam    General:  Well appearing. No resp difficulty HEENT: Normal Neck: Supple. JVP not elevated. Carotids 2+ bilat; no bruits. No lymphadenopathy or thyromegaly appreciated. Cor: PMI nondisplaced. Regular rate & rhythm. No rubs, gallops or murmurs. Lungs: Clear Abdomen: Soft, nontender, nondistended. No hepatosplenomegaly. No bruits or masses. Good bowel sounds. Extremities: No cyanosis, clubbing, rash, edema Neuro: Alert & orientedx3, cranial nerves grossly intact. moves all 4 extremities w/o difficulty. Affect pleasant   Telemetry   NSR with RV pacing (personally reviewed)   Labs    CBC Recent Labs    11/26/19 0517 11/26/19 0517 11/26/19 0950 11/27/19 0515  WBC 9.8  --   --  9.6  HGB 11.4*   < > 11.6*  11.6* 13.1  HCT 34.9*   < > 34.0*  34.0* 40.3  MCV 86.8  --   --  86.1  PLT 443*  --   --  554*   < > = values in this interval not displayed.   Basic Metabolic Panel Recent Labs    11/26/19 0517 11/26/19 0517  11/26/19 0950 11/27/19 0515  NA 136   < > 141  141 139  K 4.4   < > 4.2  4.1 4.2  CL 103  --   --  105  CO2 26  --   --  25  GLUCOSE 141*  --   --  139*  BUN 10  --   --  8  CREATININE 0.82  --   --  0.87  CALCIUM 8.6*  --   --  8.7*   < > = values in this interval not displayed.   Liver Function Tests No results for input(s): AST, ALT, ALKPHOS, BILITOT, PROT, ALBUMIN in the last 72 hours. No results for input(s): LIPASE, AMYLASE in the last 72 hours. Cardiac Enzymes No results for input(s): CKTOTAL, CKMB, CKMBINDEX, TROPONINI in the last 72 hours.  BNP: BNP (last 3 results) Recent Labs    11/05/19 0736 11/16/19 0852 11/23/19 1736  BNP 140.5* 173.6* 342.0*    ProBNP (last 3 results) No results for input(s): PROBNP in the last 8760 hours.   D-Dimer No results for input(s): DDIMER in the last 72 hours. Hemoglobin A1C No results for input(s): HGBA1C in the last 72 hours. Fasting Lipid Panel Recent Labs    11/26/19 0517  CHOL 157  HDL  28*  LDLCALC 111*  TRIG 92  CHOLHDL 5.6   Thyroid Function Tests Recent Labs    11/26/19 0517  TSH 0.334*    Other results:   Imaging     No results found.   Medications:     Scheduled Medications: . azithromycin  250 mg Oral Daily  . bisoprolol  2.5 mg Oral Daily  . colchicine  0.6 mg Oral BID  . enoxaparin (LOVENOX) injection  40 mg Subcutaneous Q24H  . furosemide  20 mg Oral Daily  . pantoprazole  40 mg Oral BID  . PARoxetine  60 mg Oral QHS  . predniSONE  20 mg Oral BID WC  . sacubitril-valsartan  1 tablet Oral BID  . simvastatin  10 mg Oral QHS  . sodium chloride flush  3 mL Intravenous Q12H  . sodium chloride flush  3 mL Intravenous Q12H  . sodium chloride flush  3 mL Intravenous Q12H  . spironolactone  12.5 mg Oral Once  . [START ON 11/28/2019] spironolactone  25 mg Oral Daily  . umeclidinium bromide  1 puff Inhalation Daily     Infusions: . sodium chloride    . sodium chloride    .  cefTRIAXone (ROCEPHIN)  IV Stopped (11/27/19 0534)     PRN Medications:  sodium chloride, sodium chloride, acetaminophen, albuterol, ipratropium-albuterol, menthol-cetylpyridinium, ondansetron (ZOFRAN) IV, sodium chloride flush, sodium chloride flush    Assessment/Plan   1. Acute systolic CHF: Echo this admission with LV EF < 20%, RV moderately decreased systolic function, small-moderate pericardial effusion surrounding heart w/o evidence for tamponade. Echo in 2/21 prior to PPM placement showed EF 60-65%. This admission, patient had symptoms concerning for viral myopericarditis with pericardial effusion and pleuritic chest pain => however, troponin not significantly elevated, pointing away from a myocarditis component.  On exam, he is not volume overloaded.  RHC/LHC 7/19 showed no coronary disease, normal filling pressures, normal cardiac output.  Nonischemic cardiomyopathy, but cause uncertain => consider chronic RV pacing as cause as well as viral myocarditis, though lack of troponin elevation with pericarditis symptoms suggests myocarditis would not be acute. - I will obtain a cardiac MRI => device is MRI compatible. Plan for this morning.  - Continue Entresto 24/26 bid.  - Increase spironolactone to 25 mg daily.  - Add bisoprolol 2.5 mg daily (use more beta-1 selective beta blocker with wheezing, suspected COPD).  - I will ask EP to see him, I think he is going to need CRT upgrade as this seems the most likely cause of fall in EF.  Will need get cMRI as above, if he has significant scarring from prior myocarditis or sarcoidosis, would push [place] towards CRT-D.  2. Acute (myo)pericarditis: Pleuritic chest pain with pericardial effusion on echo as well as elevated CRP and ESR.  Myocarditis component does not seem prominent given near normal HS-TnI.  Chest pain improved, he is currently on prednisone + colchicine.   - Would continue colchicine x 3 months.  - Would taper off prednisone, decrease to  10 mg daily tomorrow.  - Cardiac MRI as above to look for myocarditis involvement.  3. Pericardial effusion: Small-moderate by echo, CT.  No evidence for tamponade by echo or cath.  Continue to treat pericarditis.  4. ?COPD exacerbation with wheezing: Prior smoker.   - On azithromycin/ceftriaxone x 5 days.  - Prednisone taper.  5. Complete heart block: PPM implanted in 2/21, no definite cause for CHB was found at the time.  - As above, getting  cMRI => ? is cardiac sarcoidosis the etiology.   Length of Stay: [place], MD  11/27/2019, 9:40 AM  Advanced Heart Failure Team Pager [phone] (M-F; 7a - 4p)  Please contact [facility] for night-coverage after hours (4p -7a ) and weekends on [email or web address]

## 2019-11-27 NOTE — TOC Initial Note (Addendum)
Transition of Care [facility]) - Initial/Assessment Note    Patient Details  Name: Glenn Brandt MRN: [id number] Date of Birth: [birth year]  [facility] CM/SW Contact:    Bethena Roys, RN Phone Number: 11/27/2019, 10:38 AM  Clinical Narrative: High risk for readmission assessment completed. Prior to arrival patient was from home with support of wife. Patient states he goes to [facility] and gets his medications from them as well. Patient either drives to pick up the medications from the [place] or the [place] has them mailed to the patient. Case Manager received a consult for the heart failure screen- Patient will not need home health services at this time. Case Manager will continue to follow for additional transition of care needs.                   1042 11-27-19 Call placed to the [facility] just to verify confirmation that they were aware the patient was hospitalized.   Expected Discharge Plan: Home/Self Care Barriers to Discharge: Continued Medical Work up   Patient Goals and CMS Choice Patient states their goals for this hospitalization and ongoing recovery are:: to return home   Choice offered to / list presented to : NA  Expected Discharge Plan and Services Expected Discharge Plan: Home/Self Care In-house Referral: NA Discharge Planning Services: CM Consult Post Acute Care Choice: NA Living arrangements for the past 2 months: Single Family Home                 DME Arranged: N/A DME Agency: NA       HH Arranged: NA     Prior Living Arrangements/Services Living arrangements for the past 2 months: Single Family Home Lives with:: Spouse   Do you feel safe going back to the place where you live?: Yes      Need for Family Participation in Patient Care: Yes (Comment) Care giver support system in place?: Yes (comment)   Criminal Activity/Legal Involvement Pertinent to Current Situation/Hospitalization: No - Comment as needed  Activities of Daily  Living Home Assistive Devices/Equipment: None ADL Screening (condition at time of admission) Patient's cognitive ability adequate to safely complete daily activities?: Yes Is the patient deaf or have difficulty hearing?: No Does the patient have difficulty seeing, even when wearing glasses/contacts?: No Does the patient have difficulty concentrating, remembering, or making decisions?: No Patient able to express need for assistance with ADLs?: Yes Does the patient have difficulty dressing or bathing?: No Independently performs ADLs?: Yes (appropriate for developmental age) Does the patient have difficulty walking or climbing stairs?: No Weakness of Legs: None Weakness of Arms/Hands: None  Permission Sought/Granted Permission sought to share information with : Family Supports, Case Freight forwarder, Chartered certified accountant granted to share information with : Yes, Verbal Permission Granted     Permission granted to share info w AGENCY: [facility]- aware that patient is hospitalized.        Emotional Assessment Appearance:: Appears stated age Attitude/Demeanor/Rapport: Engaged Affect (typically observed): Appropriate Orientation: : Oriented to Situation, Oriented to  Time, Oriented to Place, Oriented to Self Alcohol / Substance Use: Not Applicable Psych Involvement: No (comment)  Admission diagnosis:  Pericardial effusion [I31.3] Pericarditis [I31.9] Pericarditis, unspecified chronicity, unspecified type [I31.9] Patient Active Problem List   Diagnosis Date Noted  . Pericarditis 11/23/2019  . COPD exacerbation ([place])   . Acute respiratory failure with hypoxia ([place]) 11/06/2019  . Acute on chronic respiratory failure with hypoxia ([place]) 10/29/2019  . CHB (complete heart  block) ([place]) 07/01/2019  . Syncope, cardiogenic 07/01/2019  . HTN (hypertension) 07/01/2019  . Prostate cancer ([place]) 07/01/2019   PCP:  Center, [place]:   [place],  [place] - [address] [place] [address] Phone: [phone] Fax: [phone]    Social Determinants of Health (SDOH) Interventions    Readmission Risk Interventions Readmission Risk Prevention Plan 11/27/2019  Transportation Screening Complete  Medication Review ([place]) Complete  PCP or Specialist appointment within 3-5 days of discharge Complete  HRI or [place] Complete  SW Recovery Care/Counseling Consult Complete  [place] Not Applicable  Some recent data might be hidden

## 2019-11-27 NOTE — Progress Notes (Signed)
Changed device settings for MRI to DOO at 100 bpm per order.  SWOT RN is staying to monitor patient.  Will program device back to pre-MRI settings after completion of exam.

## 2019-11-27 NOTE — Progress Notes (Signed)
Informed of MRI for today.   Device system confirmed to be MRI conditional, with implant date > 6 weeks ago and no evidence of abandoned or epicardial leads in review of most recent CXR Interrogation from today reviewed, pt is currently AS-VP at ~94 bpm Change device settings for MRI to DOO at 100 bpm  Program device back to pre-MRI settings after completion of exam.  I have also seen the patient and discussed that we would be seeing him and considering possible upgrade to his PPM depending on his course on medications. Will consult formally with Dr. Caryl Comes 11/28/19 as we will have more complete data then s/p cMRI  Shirley Friar, PA-C  11/27/2019 10:58 AM

## 2019-11-28 DIAGNOSIS — I428 Other cardiomyopathies: Secondary | ICD-10-CM

## 2019-11-28 DIAGNOSIS — B3323 Viral pericarditis: Secondary | ICD-10-CM

## 2019-11-28 DIAGNOSIS — Z950 Presence of cardiac pacemaker: Secondary | ICD-10-CM

## 2019-11-28 DIAGNOSIS — I442 Atrioventricular block, complete: Secondary | ICD-10-CM

## 2019-11-28 LAB — CBC
HCT: 43 % (ref 39.0–52.0)
Hemoglobin: 13.8 g/dL (ref 13.0–17.0)
MCH: 27.1 pg (ref 26.0–34.0)
MCHC: 32.1 g/dL (ref 30.0–36.0)
MCV: 84.5 fL (ref 80.0–100.0)
Platelets: 647 10*3/uL — ABNORMAL HIGH (ref 150–400)
RBC: 5.09 MIL/uL (ref 4.22–5.81)
RDW: 13.1 % (ref 11.5–15.5)
WBC: 10.1 10*3/uL (ref 4.0–10.5)
nRBC: 0 % (ref 0.0–0.2)

## 2019-11-28 LAB — BASIC METABOLIC PANEL
Anion gap: 8 (ref 5–15)
BUN: 10 mg/dL (ref 8–23)
CO2: 26 mmol/L (ref 22–32)
Calcium: 9 mg/dL (ref 8.9–10.3)
Chloride: 104 mmol/L (ref 98–111)
Creatinine, Ser: 0.96 mg/dL (ref 0.61–1.24)
GFR calc Af Amer: >60 mL/min (ref >60)
GFR calc non Af Amer: >60 mL/min (ref >60)
Glucose, Bld: 127 mg/dL — ABNORMAL HIGH (ref 70–99)
Potassium: 4.4 mmol/L (ref 3.5–5.1)
Sodium: 138 mmol/L (ref 135–145)

## 2019-11-28 MED ORDER — CYCLOBENZAPRINE HCL 10 MG PO TABS: 10.0000 mg | ORAL_TABLET | Freq: Every evening | ORAL | Status: DC | PRN

## 2019-11-28 MED ORDER — SODIUM CHLORIDE 0.9 % IV SOLN: INTRAVENOUS | Status: DC

## 2019-11-28 MED ORDER — HYDROCODONE-ACETAMINOPHEN 5-325 MG PO TABS
1.0000 | ORAL_TABLET | Freq: Three times a day (TID) | ORAL | Status: DC | PRN
  Administered 2019-11-28 – 2019-11-29 (×2): 1 via ORAL
  Filled 2019-11-28 (×2): qty 1

## 2019-11-28 NOTE — Progress Notes (Addendum)
Patient ID: Glenn Brandt, male   DOB: 05-21-1960, 59 y.o.   MRN: [id number]     Advanced Heart Failure Rounding Note  PCP-Cardiologist: No primary care provider on file.   Subjective:    Feels ok this morning. Felt a little SOB yesterday evening while trying to bath. No orthopnea/PND overnight.   Continues w/ slight pleuritic CP but overall improved.   VS and labs stable.   cMRI 7/20 1. Moderately LV with septal lateral dyssynchrony and diffuse hypokinesis, EF 20% (estimated as dyssynchrony made quantification difficult/inaccurate).  2. Mildly dilated RV with moderate systolic dysfunction. RV pacemaker.  3. No intramyocardial LGE, but there were areas of enhancement in the pericardium, suggestive of possible pericarditis.  4.  Small pericardial effusion.   Objective:   Weight Range: 83.7 kg Body mass index is 25.75 kg/m.   Vital Signs:   Temp:  [98.1 F (36.7 C)-98.6 F (37 C)] 98.5 F (36.9 C) (07/21 0500) Pulse Rate:  [85-100] 85 (07/21 0500) Resp:  [18-19] 18 (07/21 0500) BP: (105-115)/(76-89) 105/76 (07/21 0500) SpO2:  [93 %-97 %] 93 % (07/21 0500) Weight:  [83.7 kg] 83.7 kg (07/21 0500) Last BM Date: 11/26/19  Weight change: Filed Weights   11/26/19 0417 11/27/19 0533 11/28/19 0500  Weight: 85.7 kg 84.4 kg 83.7 kg    Intake/Output:   Intake/Output Summary (Last 24 hours) at 11/28/2019 0911 Last data filed at 11/28/2019 0500 Gross per 24 hour  Intake --  Output 750 ml  Net -750 ml      Physical Exam    General:  Well appearing. No respiratory difficulty HEENT: normal Neck: supple. no JVD. Carotids 2+ bilat; no bruits. No lymphadenopathy or thyromegaly appreciated. Cor: PMI nondisplaced. Regular rate & rhythm. No rubs, gallops or murmurs. Lungs: clear Abdomen: soft, nontender, nondistended. No hepatosplenomegaly. No bruits or masses. Good bowel sounds. Extremities: no cyanosis, clubbing, rash, edema Neuro: alert & oriented x 3, cranial  nerves grossly intact. moves all 4 extremities w/o difficulty. Affect pleasant.   Telemetry   NSR with RV pacing, 70s  (personally reviewed)   Labs    CBC Recent Labs    11/27/19 0515 11/28/19 0605  WBC 9.6 10.1  HGB 13.1 13.8  HCT 40.3 43.0  MCV 86.1 84.5  PLT 554* 409*   Basic Metabolic Panel Recent Labs    11/27/19 0515 11/28/19 0605  NA 139 138  K 4.2 4.4  CL 105 104  CO2 25 26  GLUCOSE 139* 127*  BUN 8 10  CREATININE 0.87 0.96  CALCIUM 8.7* 9.0   Liver Function Tests No results for input(s): AST, ALT, ALKPHOS, BILITOT, PROT, ALBUMIN in the last 72 hours. No results for input(s): LIPASE, AMYLASE in the last 72 hours. Cardiac Enzymes No results for input(s): CKTOTAL, CKMB, CKMBINDEX, TROPONINI in the last 72 hours.  BNP: BNP (last 3 results) Recent Labs    11/05/19 0736 11/16/19 0852 11/23/19 1736  BNP 140.5* 173.6* 342.0*    ProBNP (last 3 results) No results for input(s): PROBNP in the last 8760 hours.   D-Dimer No results for input(s): DDIMER in the last 72 hours. Hemoglobin A1C No results for input(s): HGBA1C in the last 72 hours. Fasting Lipid Panel Recent Labs    11/26/19 0517  CHOL 157  HDL 28*  LDLCALC 111*  TRIG 92  CHOLHDL 5.6   Thyroid Function Tests Recent Labs    11/26/19 0517  TSH 0.334*    Other results:   Imaging  MR CARDIAC MORPHOLOGY W WO CONTRAST  Result Date: 11/27/2019 CLINICAL DATA:  Cardiomyopathy of uncertain etiology EXAM: CARDIAC MRI TECHNIQUE: The patient was scanned on a 1.5 Tesla GE magnet. A dedicated cardiac coil was used. Functional imaging was done using Fiesta sequences. 2,3, and 4 chamber views were done to assess for RWMA's. Modified Simpson's rule using a short axis stack was used to calculate an ejection fraction on a dedicated work Conservation officer, nature. The patient received 8 cc of Gadavist. After 10 minutes inversion recovery sequences were used to assess for infiltration and scar  tissue. FINDINGS: Small left pleural effusion.  Suspect atelectasis left base. Small circumferential pericardial effusion. Moderately dilated left ventricle with normal wall thickness. There is diffuse hypokinesis with septal-lateral dyssynchrony. Due to dyssynchrony, difficult to quantify LV EF. Estimated EF 20%. The right ventricle was mildly dilated with moderate systolic dysfunction. Mildly dilated left atrium and mildly dilated right atrium. There is a pacemaker in the right ventricle. Trileaflet aortic valve, no significant regurgitation or stenosis. No significant mitral regurgitation noted. On delayed enhancement imaging, there did not appear to be intramyocardial late gadolinium enhancement (LGE), but there did appear to be areas of enhancement involving the pericardium. IMPRESSION: 1. Moderately LV with septal lateral dyssynchrony and diffuse hypokinesis, EF 20% (estimated as dyssynchrony made quantification difficult/inaccurate). 2. Mildly dilated RV with moderate systolic dysfunction. RV pacemaker. 3. No intramyocardial LGE, but there were areas of enhancement in the pericardium, suggestive of possible pericarditis. 4.  Small pericardial effusion. Ardella Chhim Electronically Signed   By: Loralie Champagne M.D.   On: 11/27/2019 17:35     Medications:     Scheduled Medications: . bisoprolol  2.5 mg Oral Daily  . colchicine  0.6 mg Oral BID  . enoxaparin (LOVENOX) injection  40 mg Subcutaneous Q24H  . furosemide  20 mg Oral Daily  . pantoprazole  40 mg Oral BID  . PARoxetine  60 mg Oral QHS  . predniSONE  10 mg Oral Q breakfast  . sacubitril-valsartan  1 tablet Oral BID  . simvastatin  10 mg Oral QHS  . sodium chloride flush  3 mL Intravenous Q12H  . sodium chloride flush  3 mL Intravenous Q12H  . sodium chloride flush  3 mL Intravenous Q12H  . spironolactone  25 mg Oral Daily  . umeclidinium bromide  1 puff Inhalation Daily    Infusions: . sodium chloride    . sodium chloride       PRN Medications: sodium chloride, sodium chloride, acetaminophen, albuterol, ipratropium-albuterol, menthol-cetylpyridinium, ondansetron (ZOFRAN) IV, sodium chloride flush, sodium chloride flush    Assessment/Plan   1. Acute systolic CHF: Echo this admission with LV EF < 20%, RV moderately decreased systolic function, small-moderate pericardial effusion surrounding heart w/o evidence for tamponade. Echo in 2/21 prior to PPM placement showed EF 60-65%. This admission, patient had symptoms concerning for viral myopericarditis with pericardial effusion and pleuritic chest pain => however, troponin not significantly elevated, pointing away from a myocarditis component.  On exam, he is not volume overloaded.  RHC/LHC 7/19 showed no coronary disease, normal filling pressures, normal cardiac output.  Nonischemic cardiomyopathy, but cause uncertain => consider chronic RV pacing as cause as well as viral myocarditis, though lack of troponin elevation with pericarditis symptoms suggests myocarditis would not be acute. cMRI showed no intramyocardial LGE, but there were areas of enhancement in the pericardium, suggestive of possible pericarditis. - Continue Entresto 24/26 bid. BP too soft for titration.  - Continue spironolactone  25 mg daily.  - Continue bisoprolol 2.5 mg daily (use more beta-1 selective beta blocker with wheezing, suspected COPD).  - I think he is going to need CRT-D upgrade as this seems the most likely cause of fall in EF. EP has been consulted. Will see today  2. Acute (myo)pericarditis: Pleuritic chest pain with pericardial effusion on echo as well as elevated CRP and ESR.  Myocarditis component does not seem prominent given near normal HS-TnI. cMRI c/w pericarditis.  Chest pain improved, he is currently on prednisone + colchicine.   - Would continue colchicine x 3 months.  - Taper off prednisone, decrease to 10 mg daily starting today  3. Pericardial effusion: Small-moderate by echo,  CT.  No evidence for tamponade by echo or cath.  Continue to treat pericarditis.  4. ?COPD exacerbation with wheezing: Prior smoker.   - On azithromycin/ceftriaxone x 5 days.  - Prednisone taper per above.  5. Complete heart block: PPM implanted in 2/21, no definite cause for CHB was found at the time.  - cMRI does not suggest cardiac sarcoidosis to be the etiology.   Length of Stay: [address] PA-C  11/28/2019, 9:11 AM  Advanced Heart Failure Team Pager [phone] (M-F; [facility])  Please contact [facility] for night-coverage after hours (4p -7a ) and weekends on [email or web address]  Patient seen with PA, agree with the above note.    No dyspnea, no further pleuritic chest pain.   cMRI 7/20:  1. Moderately LV with septal lateral dyssynchrony and diffuse hypokinesis, EF 20% (estimated as dyssynchrony made quantification difficult/inaccurate). 2. Mildly dilated RV with moderate systolic dysfunction. RV pacemaker. 3. No intramyocardial LGE, but there were areas of enhancement in the pericardium, suggestive of possible pericarditis. 4.  Small pericardial effusion.  General: NAD Neck: No JVD, no thyromegaly or thyroid nodule.  Lungs: Clear to auscultation bilaterally with normal respiratory effort. CV: Nondisplaced PMI.  Heart regular S1/S2, no S3/S4, no murmur.  No peripheral edema.   Abdomen: Soft, nontender, no hepatosplenomegaly, no distention.  Skin: Intact without lesions or rashes.  Neurologic: Alert and oriented x 3.  Psych: Normal affect. Extremities: No clubbing or cyanosis.  HEENT: Normal.   Stable, appears euvolemic today.  - Would continue current bisoprolol, Entresto, and spironolactone.  - Continue Lasix 20 mg daily.  - Cardiac MRI did not show evidence for myocarditis (did show evidence for pericarditis).  There was no evidence for cardiac sarcoidosis.  Given no CAD on cath and no LGE on MRI, fall in EF may well be due to chronic RV pacing.  Patient was  markedly dyssynchronous => Would favor upgrading device to CRT, possible today (Dr. Caryl Comes to see).  Regarding ICD, with nonischemic and possibly reversible cause, think we may be able to avoid defibrillator but will discuss with EP. Still not clear why the patient developed CHB.   There was evidence by MRI for acute pericarditis with some pericardial enhancement and small effusion.   - Continue colchicine x 3 months.  - Begin to titrate off prednisone.   Loralie Champagne 11/28/2019 9:59 AM

## 2019-11-28 NOTE — Consult Note (Addendum)
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 Inflow: Patent with mild atherosclerosis. Veins: Not evaluated. Review of the MIP images confirms the above findings. NON-VASCULAR Hepatobiliary: No focal liver abnormality is seen. No gallstones, gallbladder wall thickening, or biliary dilatation. Pancreas: Unremarkable. Spleen: Unremarkable. Adrenals/Urinary Tract: Adrenals, kidneys, and bladder are unremarkable. Stomach/Bowel: The stomach is within normal limits. Bowel is normal in caliber. Normal appendix. Lymphatic: No enlarged lymph nodes identified. Reproductive: Prostate is unremarkable. Other: No ascites.  Small fat containing paraumbilical hernia. Musculoskeletal: No acute osseous abnormality. Review of the MIP images confirms the above findings. IMPRESSION: No evidence of aortic dissection. New cardiomegaly. Electronically Signed   By: Macy Mis M.D.   On: 11/16/2019 08:21   ECHOCARDIOGRAM LIMITED  Result Date: 11/25/2019    ECHOCARDIOGRAM LIMITED REPORT   Patient Name:   Glenn Brandt Date of Exam: 11/25/2019 Medical Rec #:  [id number]      Height:       71.0 in Accession #:    [id number]     Weight:       195.3 lb Date of  Birth:  1961/01/08      BSA:          2.087 m Patient Age:    50 years       BP:           110/79 mmHg Patient Gender: M              HR:           97 bpm. Exam Location:  Inpatient Procedure: Limited Color Doppler, Cardiac Doppler and Limited Echo Indications:    pericardial effusion 423.9  History:        Patient has prior history of Echocardiogram examinations, most                 recent 11/24/2019. Pericarditis, COPD; Risk Factors:Hypertension.  Sonographer:    Johny Chess Referring Phys: [id number] Howard  1. Severe LV dysfunction. Except for basal lateral and base/mid anterior segments, all other segments are severely hypokinetic/akinetic. Compared to echo from Feb 2021, these findings are new.. Left ventricular ejection fraction, by estimation, is <20%.  The left ventricle has severely decreased function. The left ventricle demonstrates regional wall motion abnormalities (see scoring diagram/findings for description). The left ventricular internal cavity size was severely dilated. There is mild left ventricular hypertrophy.  2. Right ventricular systolic function is moderately reduced. The right ventricular size is normal. There is normal pulmonary artery systolic pressure.  3. Small to moderate pericardial effusion surrounds heart, largest around RA/RV. No evidence for RV/RA collapse, doe not meet echo criteria for tamponade.     Large pleural effusion,. Moderate pericardial effusion. Large pleural effusion in the left lateral region.  4. The mitral valve is normal in structure. Mild mitral valve regurgitation.  5. The aortic  valve is normal in structure. Aortic valve regurgitation is trivial.  6. The inferior vena cava is normal in size with <50% respiratory variability, suggesting right atrial pressure of 8 mmHg. FINDINGS  Left Ventricle: Severe LV dysfunction. Except for basal lateral and base/mid anterior segments, all other segments are severely hypokinetic/akinetic. Compared  to echo from Feb 2021, these findings are new. Left ventricular ejection fraction, by estimation, is <20%. The left ventricle has severely decreased function. The left ventricle demonstrates regional wall motion abnormalities. The left ventricular internal cavity size was severely dilated. There is mild left ventricular hypertrophy. Right Ventricle: The right ventricular size is normal. Right ventricular systolic function is moderately reduced. There is normal pulmonary artery systolic pressure. The tricuspid regurgitant velocity is 1.97 m/s, and with an assumed right atrial pressure of 3 mmHg, the estimated right ventricular systolic pressure is 07.3 mmHg. Pericardium: Small to moderate pericardial effusion surrounds heart, largest around RA/RV. No evidence for RV/RA collapse, doe not meet echo criteria for tamponade. Large pleural effusion,. A moderately sized pericardial effusion is present. Mitral Valve: The mitral valve is normal in structure. Mild mitral valve regurgitation. Tricuspid Valve: The tricuspid valve is normal in structure. Tricuspid valve regurgitation is trivial. Aortic Valve: The aortic valve is normal in structure. Aortic valve regurgitation is trivial. Pulmonic Valve: The pulmonic valve was normal in structure. Pulmonic valve regurgitation is not visualized. Venous: The inferior vena cava is normal in size with less than 50% respiratory variability, suggesting right atrial pressure of 8 mmHg. Additional Comments: A pacer wire is visualized. There is a large pleural effusion in the left lateral region. LEFT VENTRICLE PLAX 2D LVIDd:         6.40 cm LVIDs:         5.10 cm LV PW:         1.10 cm LV IVS:        1.20 cm  IVC IVC diam: 1.60 cm LEFT ATRIUM         Index LA diam:    4.10 cm 1.96 cm/m  AORTIC VALVE LVOT Vmax:   111.00 cm/s LVOT Vmean:  66.100 cm/s LVOT VTI:    0.183 m TRICUSPID VALVE TR Peak grad:   15.5 mmHg TR Vmax:        197.00 cm/s  SHUNTS Systemic VTI: 0.18 m Dorris Carnes MD  Electronically signed by Dorris Carnes MD Signature Date/Time: 11/25/2019/3:27:37 PM    Final    EKG: on admission showed V pacing at ~140 (measuring in ms comes out to ~ 125) with wide QRS at 170 ms. (personally reviewed)  TELEMETRY: V pacing in 80-110s (personally reviewed)  DEVICE HISTORY: MDT DDD implanted 06/2019 for CHB  Assessment/Plan: 1. Complete heart block:  S/p MDT PPM implanted 06/2019  No clear reason for CHB, cMRI not suggestive of infiltrative CMP.   2. Acute systolic CHF:  Newly reduced EF this admission. Prior to PPM EF 60-65% Has had symptoms concerning for viral myopericarditis and cMRI mildly suggestive of same. Though no significant troponin elevation. RHC/LHC 11/26/19 showed no coronary disease, normal filling pressures, normal cardiac output. NICM -> ? Chronic RV pacing vs pericarditis though lack of troponin confounds further.  On GDMT per CHF team.  Will discuss with Dr. Caryl Comes CRT upgrade +/- HV lead.   3. Acute (myo)pericarditis:  Pleuritic chest pain with pericardial effusion on echo as well as elevated CRP and ESR.  HS-Tnl unimpressive Has improved on prednisone/colchicine.  Cardiac MRI with no intramyocardial LGE, areas  of enhancement suggestive of pericarditis.   4. Pericardial effusion:  Small-moderate by echo, CT.  No evidence of tamponade by echo or cath.   5. ?COPD exacerbation with wheezing: Prior smoker.  On azithromycin/ceftriaxone x 5 days per primary team.   Dr. Caryl Comes to see to discuss further.   For questions or updates, please contact [facility] Please consult [email or web address] for contact info under Cardiology/STEMI.  Signed, Shirley Friar, PA-C  11/28/2019 10:45 AM  Complete heart block  Acute LV dysfunction  Systolic heart failure-acute  Myopericarditis-acute     The patient presents with abrupt onset of shortness of breath in the context of fever and pleuritic chest pain.  Sed rate is elevated CRP is elevated  troponins were unremarkable.  MRI shows some gadolinium enhancement of the pericardium with no scarring gadolinium in the myocardium.  He is now 6 months status post pacing for complete heart block the initial mechanism of which has not yet been clarified.  (MRI/PET were anticipated as an outpatient.)  Differential diagnosis includes #1-myopericarditis process, although the troponins and MRI are not supportive of a hyperacute process.  #2-Takotsubo-like process is suggested by the echo report #3-pacemaker associated cardiomyopathy.  Occam's razor is challenged here by the presence of fever sed rate in the absence of the other findings.  However, it is not at this point clear that pacemaker associated cardiomyopathy is responsible for the heart failure.  Hence, after discussions with Dr. DM, we will pause on upgrading his device and look to see what resolution occurs over the next 4-6 weeks.  I reviewed this extensively with the patient who expresses understanding and is in agreement.  There are not strong data to suggest that acute nonischemic cardiomyopathy is benefited by the use of LifeVest

## 2019-11-29 LAB — CULTURE, BLOOD (ROUTINE X 2)
Culture: NO GROWTH
Culture: NO GROWTH
Special Requests: ADEQUATE
Special Requests: ADEQUATE

## 2019-11-29 LAB — DRUG PROFILE, UR, 9 DRUGS (LABCORP)
Amphetamines, Urine: NEGATIVE ng/mL
Barbiturate, Ur: NEGATIVE ng/mL
Benzodiazepine Quant, Ur: NEGATIVE ng/mL
Cannabinoid Quant, Ur: NEGATIVE ng/mL
Cocaine (Metab.): NEGATIVE ng/mL
Methadone Screen, Urine: NEGATIVE ng/mL
Opiate Quant, Ur: POSITIVE ng/mL — AB
Phencyclidine, Ur: NEGATIVE ng/mL
Propoxyphene, Urine: NEGATIVE ng/mL

## 2019-11-29 LAB — BASIC METABOLIC PANEL
Anion gap: 11 (ref 5–15)
BUN: 13 mg/dL (ref 8–23)
CO2: 24 mmol/L (ref 22–32)
Calcium: 8.8 mg/dL — ABNORMAL LOW (ref 8.9–10.3)
Chloride: 101 mmol/L (ref 98–111)
Creatinine, Ser: 1.23 mg/dL (ref 0.61–1.24)
GFR calc Af Amer: >60 mL/min (ref >60)
GFR calc non Af Amer: >60 mL/min (ref >60)
Glucose, Bld: 154 mg/dL — ABNORMAL HIGH (ref 70–99)
Potassium: 4 mmol/L (ref 3.5–5.1)
Sodium: 136 mmol/L (ref 135–145)

## 2019-11-29 LAB — CBC
HCT: 47.3 % (ref 39.0–52.0)
Hemoglobin: 15.2 g/dL (ref 13.0–17.0)
MCH: 27.6 pg (ref 26.0–34.0)
MCHC: 32.1 g/dL (ref 30.0–36.0)
MCV: 86 fL (ref 80.0–100.0)
Platelets: 716 10*3/uL — ABNORMAL HIGH (ref 150–400)
RBC: 5.5 MIL/uL (ref 4.22–5.81)
RDW: 13.2 % (ref 11.5–15.5)
WBC: 10.1 10*3/uL (ref 4.0–10.5)
nRBC: 0 % (ref 0.0–0.2)

## 2019-11-29 MED ORDER — BISOPROLOL FUMARATE 5 MG PO TABS: 5.0000 mg | ORAL_TABLET | Freq: Every day | ORAL | 5 refills | Status: DC

## 2019-11-29 MED ORDER — COLCHICINE 0.6 MG PO TABS: 0.6000 mg | ORAL_TABLET | Freq: Two times a day (BID) | ORAL | 2 refills | Status: DC

## 2019-11-29 MED ORDER — TAMSULOSIN HCL 0.4 MG PO CAPS: 0.4000 mg | ORAL_CAPSULE | Freq: Every day | ORAL | 0 refills | Status: AC

## 2019-11-29 MED ORDER — SACUBITRIL-VALSARTAN 24-26 MG PO TABS: 1.0000 | ORAL_TABLET | Freq: Two times a day (BID) | ORAL | 5 refills | Status: DC

## 2019-11-29 MED ORDER — SPIRONOLACTONE 25 MG PO TABS: 25.0000 mg | ORAL_TABLET | Freq: Every day | ORAL | 5 refills | Status: DC

## 2019-11-29 MED ORDER — FUROSEMIDE 20 MG PO TABS: 20.0000 mg | ORAL_TABLET | Freq: Every day | ORAL | 5 refills | Status: DC

## 2019-11-29 MED ORDER — BISOPROLOL FUMARATE 5 MG PO TABS: 5.0000 mg | ORAL_TABLET | Freq: Every day | ORAL | Status: DC

## 2019-11-29 MED FILL — MITIGARE 0.6 MG CAPSULE: 0.6 | 2 days supply | Qty: 4 | Fill #0

## 2019-11-29 MED FILL — SPIRONOLACTONE 25 MG TABLET: 25 | 2 days supply | Qty: 2 | Fill #0

## 2019-11-29 MED FILL — TAMSULOSIN HCL 0.4 MG CAP: 0.4 | 2 days supply | Qty: 2 | Fill #0

## 2019-11-29 MED FILL — BISOPROLOL FUMARATE 5 MG TA: 5 | 2 days supply | Qty: 2 | Fill #0

## 2019-11-29 MED FILL — FUROSEMIDE 20 MG TAB: 20 | 2 days supply | Qty: 2 | Fill #0

## 2019-11-29 MED FILL — ENTRESTO 24 MG-26 MG TABLET: 24-26 | 30 days supply | Qty: 60 | Fill #0

## 2019-11-29 NOTE — Progress Notes (Signed)
Patient ID: Glenn Brandt, male   DOB: Feb 12, 1961, 59 y.o.   MRN: [id number]     Advanced Heart Failure Rounding Note  PCP-Cardiologist: No primary care provider on file.   Subjective:    Feels good this morning, no chest pain or dyspnea.   cMRI 7/20 1. Moderately LV with septal lateral dyssynchrony and diffuse hypokinesis, EF 20% (estimated as dyssynchrony made quantification difficult/inaccurate). 2. Mildly dilated RV with moderate systolic dysfunction. RV pacemaker. 3. No intramyocardial LGE, but there were areas of enhancement in the pericardium, suggestive of possible pericarditis. 4.  Small pericardial effusion.   Objective:   Weight Range: 83.4 kg Body mass index is 25.63 kg/m.   Vital Signs:   Temp:  [97.9 F (36.6 C)-98.9 F (37.2 C)] 97.9 F (36.6 C) (07/22 0730) Pulse Rate:  [86-87] 86 (07/22 0428) Resp:  [15-17] 16 (07/22 0730) BP: (103-115)/(73-79) 103/74 (07/22 0730) SpO2:  [93 %-100 %] 93 % (07/22 0858) Weight:  [83.4 kg] 83.4 kg (07/22 0428) Last BM Date: 11/28/19  Weight change: Filed Weights   11/27/19 0533 11/28/19 0500 11/29/19 0428  Weight: 84.4 kg 83.7 kg 83.4 kg    Intake/Output:  No intake or output data in the 24 hours ending 11/29/19 0911    Physical Exam    General: NAD Neck: No JVD, no thyromegaly or thyroid nodule.  Lungs: Clear to auscultation bilaterally with normal respiratory effort. CV: Nondisplaced PMI.  Heart regular S1/S2, no S3/S4, no murmur, no rub.  No peripheral edema.   Abdomen: Soft, nontender, no hepatosplenomegaly, no distention.  Skin: Intact without lesions or rashes.  Neurologic: Alert and oriented x 3.  Psych: Normal affect. Extremities: No clubbing or cyanosis.  HEENT: Normal.    Telemetry   NSR with RV pacing, 70s (personally reviewed).    Labs    CBC Recent Labs    11/28/19 0605 11/29/19 0403  WBC 10.1 10.1  HGB 13.8 15.2  HCT 43.0 47.3  MCV 84.5 86.0  PLT 647* 809*   Basic Metabolic  Panel Recent Labs    11/27/19 0515 11/28/19 0605  NA 139 138  K 4.2 4.4  CL 105 104  CO2 25 26  GLUCOSE 139* 127*  BUN 8 10  CREATININE 0.87 0.96  CALCIUM 8.7* 9.0   Liver Function Tests No results for input(s): AST, ALT, ALKPHOS, BILITOT, PROT, ALBUMIN in the last 72 hours. No results for input(s): LIPASE, AMYLASE in the last 72 hours. Cardiac Enzymes No results for input(s): CKTOTAL, CKMB, CKMBINDEX, TROPONINI in the last 72 hours.  BNP: BNP (last 3 results) Recent Labs    11/05/19 0736 11/16/19 0852 11/23/19 1736  BNP 140.5* 173.6* 342.0*    ProBNP (last 3 results) No results for input(s): PROBNP in the last 8760 hours.   D-Dimer No results for input(s): DDIMER in the last 72 hours. Hemoglobin A1C No results for input(s): HGBA1C in the last 72 hours. Fasting Lipid Panel No results for input(s): CHOL, HDL, LDLCALC, TRIG, CHOLHDL, LDLDIRECT in the last 72 hours. Thyroid Function Tests No results for input(s): TSH, T4TOTAL, T3FREE, THYROIDAB in the last 72 hours.  Invalid input(s): FREET3  Other results:   Imaging    No results found.   Medications:     Scheduled Medications: . [START ON 11/30/2019] bisoprolol  5 mg Oral Daily  . colchicine  0.6 mg Oral BID  . enoxaparin (LOVENOX) injection  40 mg Subcutaneous Q24H  . furosemide  20 mg Oral Daily  . pantoprazole  40 mg Oral BID  . PARoxetine  60 mg Oral QHS  . sacubitril-valsartan  1 tablet Oral BID  . simvastatin  10 mg Oral QHS  . sodium chloride flush  3 mL Intravenous Q12H  . sodium chloride flush  3 mL Intravenous Q12H  . sodium chloride flush  3 mL Intravenous Q12H  . spironolactone  25 mg Oral Daily  . umeclidinium bromide  1 puff Inhalation Daily    Infusions: . sodium chloride    . sodium chloride      PRN Medications: sodium chloride, sodium chloride, albuterol, cyclobenzaprine, HYDROcodone-acetaminophen, ipratropium-albuterol, menthol-cetylpyridinium, ondansetron (ZOFRAN) IV,  sodium chloride flush, sodium chloride flush    Assessment/Plan   1. Acute systolic CHF: Echo this admission with LV EF < 20%, RV moderately decreased systolic function, small-moderate pericardial effusion surrounding heart w/o evidence for tamponade. Echo in 2/21 prior to PPM placement showed EF 60-65%. This admission, patient had symptoms concerning for viral myopericarditis with pericardial effusion and pleuritic chest pain => however, troponin not significantly elevated, pointing away from a myocarditis component.  RHC/LHC 7/19 showed no coronary disease, normal filling pressures, normal cardiac output.  Nonischemic cardiomyopathy, but cause uncertain. Cardiac MRI did not show evidence for myocarditis (did show evidence for pericarditis).  There was no evidence for cardiac sarcoidosis.  Given no CAD on cath and no LGE on MRI, fall in EF may well be due to chronic RV pacing.  Patient was markedly dyssynchronous => Would favor upgrading device to CRT.  Regarding ICD, with nonischemic and possibly reversible cause (as well as no LGE), think we may be able to avoid defibrillator.  Still not clear why the patient developed CHB.  - Continue Entresto 24/26 bid. BP too soft for titration.  - Continue spironolactone 25 mg daily.  - Can increase bisoprolol to 5 mg daily (use more beta-1 selective beta blocker with wheezing, suspected COPD).  - Have discussed device with Dr. Caryl Comes => plan to follow 4-6 weeks and repeat echo, if no improvement will need CRT upgrade.   2. Acute (myo)pericarditis: Pleuritic chest pain with pericardial effusion on echo as well as elevated CRP and ESR. Myocarditis component does not seem prominent given near normal HS-TnI. cMRI c/w pericarditis.  Chest pain improved, he is currently on prednisone + colchicine.   - Would continue colchicine x 3 months.  - Stop prednisone today.   3. Pericardial effusion: Small-moderate by echo, CT.  No evidence for tamponade by echo or cath.   Continue to treat pericarditis.  4. ?COPD exacerbation with wheezing: Prior smoker.  Wheezing resolved.  - On azithromycin/ceftriaxone x 5 days => completed.  - Stop prednisone as above.  5. Complete heart block: PPM implanted in 2/21, no definite cause for CHB was found at the time.  cMRI does not suggest cardiac sarcoidosis to be the etiology.   Home today with followup in CHF clinic.  Will need echo 4-6 wks. Needs EP followup with Dr. Caryl Comes.  Meds for home: Entresto 24/26 bid, bisoprolol 5 mg daily, spironolactone 25 daily, Lasix 20 daily, colchicine 0.6 x 3 months, Zocor 10 daily.   Length of Stay: 6  Loralie Champagne, MD  11/29/2019, 9:11 AM  Advanced Heart Failure Team Pager [phone] (M-F; [place])  Please contact [facility] for night-coverage after hours (4p -7a ) and weekends on [email or web address]

## 2019-11-29 NOTE — Discharge Summary (Signed)
Advanced Heart Failure Team  Discharge Summary   Patient ID: Glenn Brandt MRN: [id number], DOB/AGE: 61/11/1960 61 y.o. Admit date: 11/23/2019 D/C date:     11/29/2019   Primary Discharge Diagnoses:  Acute Systolic Heart Failure Nonischemic Cardiomyopathy Acute (Myo)pericarditis  H/o CHB, s/p PPM Pericardial Effusion   Hospital Course:   59 y.o. with history of complete heart block s/p dual chamber Medtronic PPM 2/21, HTN, prior smoking/?COPD.  He was admitted in 2/21 with CHB and syncope, echo in 2/21 with EF 60-65%.  PPM was placed. Initially did ok after PPM.   About 1 month ago, he started to develop dyspnea and wheezing + cough.  He went to the ER, thought to have COPD exacerbation and started on albuterol and steroids.  He was back in the ER 1 week later with wheezing and hypoxia, was briefly on bipap and given antibiotics + a prolonged steroid taper. He returned to the ED on 7/9 this time with chest pain radiating to his back. A CT chest dissection protocol was performed that was notable for "new cardiomegaly" but not aortic pathology. There was no effusion on this CT scan. He was treated with dilaudid and toradol and discharged home pain free.   The week prior to admission, he had cough and new orthopnea.  He then developed chest pain.  On 7/17, he went back to the ER and was admitted.  He was hypertension, HS-TnI not elevated.  CTA chest showed no PE but there was a new moderate pericardial effusion.  Chest pain was pleuritic. CRP elevated 20, ESR 53. Started on colchicine and prednisone continued in setting of suspected myopericarditis. COVID negative, flu negative, RSV negative. On azithromycin/ceftriaxone for possible PNA.  Mildly elevated PCT at 0.33.   Echo was done, showing LV EF < 20%, RV moderately decreased systolic function, small-moderate pericardial effusion surrounding heart w/o evidence for tamponade.   RHC/LHC showed no coronary disease, normal filling pressures,  normal cardiac output.   Cardiac MRI did not show evidence for myocarditis (did show evidence for pericarditis).  There was no evidence for cardiac sarcoidosis.  Given no CAD on cath and no LGE on MRI, fall in EF may well be due to chronic RV pacing.  Patient was markedly dyssynchronous => Would favor upgrading device to CRT. Regarding ICD, with nonischemic and possibly reversible cause (as well as no LGE), think we may be able to avoid defibrillator.  Still not clear why the patient developed CHB. EP was consulted and recommended repeating echo in 4-6 weeks. If no improvement will need CRT upgrade.    He was placed on GDMT for systolic HF, Entresto, Spiro and bisoprolol. Lasix 20 mg daily for volume control. He was continued on colchicine 0.6 mg bid for pericarditis, to be continued x 3 months.   On 7/22, he was seen and examined by Dr. Shirlee Latch and felt stable for d/c home. Post hospital f/u has been arranged w/ Dr. Shirlee Latch. Repeat echo scheduled in 6 weeks. He will f/u w/ Dr. Graciela Husbands in 8 weeks.   See Detailed Hospital Problem List Below 1. Acute systolic CHF: Echo this admission withLV EF < 20%, RV moderately decreased systolic function, small-moderate pericardial effusion surrounding heart w/o evidence for tamponade.Echo in 2/21 prior to PPM placement showed EF 60-65%. This admission, patient had symptoms concerning for viral myopericarditis with pericardial effusion and pleuritic chest pain =>however, troponin not significantly elevated, pointing away from a myocarditis component. RHC/LHC 7/19 showed no coronary disease, normal filling pressures, normal  cardiac output. Nonischemic cardiomyopathy, but cause uncertain. Cardiac MRI did not show evidence for myocarditis (did show evidence for pericarditis).  There was no evidence for cardiac sarcoidosis.  Given no CAD on cath and no LGE on MRI, fall in EF may well be due to chronic RV pacing.  Patient was markedly dyssynchronous => Would favor upgrading  device to CRT.  Regarding ICD, with nonischemic and possibly reversible cause (as well as no LGE), think we may be able to avoid defibrillator.  Still not clear why the patient developed CHB.  - Continue Entresto 24/26 bid. BP too soft for titration.  - Continue spironolactone 25 mg daily.  - Can increase bisoprolol to 5 mg daily (use more beta-1 selective beta blocker with wheezing, suspected COPD).  - Have discussed device with Dr. Caryl Comes => plan to follow 4-6 weeks and repeat echo, if no improvement will need CRT upgrade.   2. Acute (myo)pericarditis: Pleuritic chest pain with pericardial effusion on echo as well as elevated CRP and ESR. Myocarditis component does not seem prominent given near normal HS-TnI. cMRI c/w pericarditis. Chest pain improved, he is currently on prednisone + colchicine.  - Would continue colchicine x 3 months.  - Stop prednisone today.   3. Pericardial effusion: Small-moderate by echo, CT. No evidence for tamponade by echo or cath. Continue to treat pericarditis.  4. ?COPD exacerbation with wheezing: Prior smoker. Wheezing resolved.  - On azithromycin/ceftriaxone x 5 days => completed.  - Stop prednisone as above.  5. Complete heart block: PPM implanted in 2/21, no definite cause for CHB was found at the time.  cMRI does not suggest cardiac sarcoidosis to be the etiology.    Cardiac Studies   2D Echo 7/18 1. Left ventricular ejection fraction, by estimation, is 20 to 25%. The  left ventricle has severely decreased function. The left ventricle  demonstrates global hypokinesis. The left ventricular internal cavity size  was moderately dilated. Left  ventricular diastolic parameters are consistent with Grade II diastolic  dysfunction (pseudonormalization). Elevated left atrial pressure.  2. Right ventricular systolic function is normal. The right ventricular  size is normal. Tricuspid regurgitation signal is inadequate for assessing  PA pressure.  3. Left  atrial size was mildly dilated.  4. There are frequent PACs, which make interpretation of the Doppler  signal more challenging, but there is no clear evidence of enhanced  respiratory mitral or tricuspid flow variation to support tamponade. There  is no diastolic RA or RV chamber  collapse.. Moderate pericardial effusion. The pericardial effusion is  circumferential. There is no evidence of cardiac tamponade. Moderate  pleural effusion in the left lateral region.  5. The mitral valve is normal in structure. Trivial mitral valve  regurgitation.  6. The aortic valve is normal in structure. Aortic valve regurgitation is  not visualized.  7. The inferior vena cava is dilated in size with <50% respiratory  variability, suggesting right atrial pressure of 15 mmHg.  [facility] 7/19  Diagnostic Dominance: Right Left Main  No significant coronary disease.  Left Anterior Descending  No significant coronary disease.  Left Circumflex  No significant coronary disease.  Right Coronary Artery  No significant coronary disease.  Intervention  No interventions have been documented.  Right Heart Pressures RHC Procedural Findings: Hemodynamics (mmHg) RA mean 7 RV 34/7 PA 36/12, mean 24 PCWP mean 10 LV 97/13 AO 102/67  Simultaneous LV/RV pressures were not suggestive of ventricular interdependence (tracked together with respiration).   Oxygen saturations: PA 76%  AO 98%  Cardiac Output (Fick) 8.04  Cardiac Index (Fick) 3.9  Cardiac Output (Thermo) 5.9 Cardiac Index (Fick) 2.9     cMRI 7/20 1. Moderately LV with septal lateral dyssynchrony and diffuse hypokinesis, EF 20% (estimated as dyssynchrony made quantification difficult/inaccurate). 2. Mildly dilated RV with moderate systolic dysfunction. RV pacemaker. 3. No intramyocardial LGE, but there were areas of enhancement in the pericardium, suggestive of possible pericarditis. 4. Small pericardial effusion.  Discharge Weight  Range: 183 lb  Discharge Vitals: Blood pressure 103/74, pulse 86, temperature 97.9 F (36.6 C), temperature source Oral, resp. rate 16, height '5\' 11"'$  (1.803 m), weight 83.4 kg, SpO2 93 %.  Labs: Lab Results  Component Value Date   WBC 10.1 11/29/2019   HGB 15.2 11/29/2019   HCT 47.3 11/29/2019   MCV 86.0 11/29/2019   PLT 716 (H) 11/29/2019    Recent Labs  Lab 11/24/19 0112 11/25/19 0537 11/29/19 0936  NA  --    < > 136  K  --    < > 4.0  CL  --    < > 101  CO2  --    < > 24  BUN  --    < > 13  CREATININE  --    < > 1.23  CALCIUM  --    < > 8.8*  PROT 7.0  --   --   BILITOT 0.3  --   --   ALKPHOS 34*  --   --   ALT 14  --   --   AST 18  --   --   GLUCOSE  --    < > 154*   < > = values in this interval not displayed.   Lab Results  Component Value Date   CHOL 157 11/26/2019   HDL 28 (L) 11/26/2019   LDLCALC 111 (H) 11/26/2019   TRIG 92 11/26/2019   BNP (last 3 results) Recent Labs    11/05/19 0736 11/16/19 0852 11/23/19 1736  BNP 140.5* 173.6* 342.0*    ProBNP (last 3 results) No results for input(s): PROBNP in the last 8760 hours.   Diagnostic Studies/Procedures   MR CARDIAC MORPHOLOGY W WO CONTRAST  Result Date: 11/27/2019 CLINICAL DATA:  Cardiomyopathy of uncertain etiology EXAM: CARDIAC MRI TECHNIQUE: The patient was scanned on a 1.5 Tesla GE magnet. A dedicated cardiac coil was used. Functional imaging was done using Fiesta sequences. 2,3, and 4 chamber views were done to assess for RWMA's. Modified Simpson's rule using a short axis stack was used to calculate an ejection fraction on a dedicated work Conservation officer, nature. The patient received 8 cc of Gadavist. After 10 minutes inversion recovery sequences were used to assess for infiltration and scar tissue. FINDINGS: Small left pleural effusion.  Suspect atelectasis left base. Small circumferential pericardial effusion. Moderately dilated left ventricle with normal wall thickness. There is diffuse  hypokinesis with septal-lateral dyssynchrony. Due to dyssynchrony, difficult to quantify LV EF. Estimated EF 20%. The right ventricle was mildly dilated with moderate systolic dysfunction. Mildly dilated left atrium and mildly dilated right atrium. There is a pacemaker in the right ventricle. Trileaflet aortic valve, no significant regurgitation or stenosis. No significant mitral regurgitation noted. On delayed enhancement imaging, there did not appear to be intramyocardial late gadolinium enhancement (LGE), but there did appear to be areas of enhancement involving the pericardium. IMPRESSION: 1. Moderately LV with septal lateral dyssynchrony and diffuse hypokinesis, EF 20% (estimated as dyssynchrony made quantification difficult/inaccurate). 2. Mildly  dilated RV with moderate systolic dysfunction. RV pacemaker. 3. No intramyocardial LGE, but there were areas of enhancement in the pericardium, suggestive of possible pericarditis. 4.  Small pericardial effusion. Dalton Mclean Electronically Signed   By: Loralie Champagne M.D.   On: 11/27/2019 17:35    Discharge Medications   Allergies as of 11/29/2019   No Known Allergies     Medication List    STOP taking these medications   aspirin 81 MG chewable tablet   prazosin 1 MG capsule Commonly known as: MINIPRESS   predniSONE 20 MG tablet Commonly known as: DELTASONE     TAKE these medications   acetaminophen 500 MG tablet Commonly known as: TYLENOL Take 500-1,000 mg by mouth every 6 (six) hours as needed for mild pain or headache.   albuterol 108 (90 Base) MCG/ACT inhaler Commonly known as: VENTOLIN HFA Inhale 2 puffs into the lungs 4 (four) times daily as needed for wheezing or shortness of breath.   benzonatate 100 MG capsule Commonly known as: TESSALON Take 1 capsule (100 mg total) by mouth every 8 (eight) hours.   bisoprolol 5 MG tablet Commonly known as: ZEBETA Take 1 tablet (5 mg total) by mouth daily. Start taking on: November 30, 2019   cetirizine 10 MG tablet Commonly known as: ZYRTEC Take 10 mg by mouth daily as needed (for seasonal allergies).   colchicine 0.6 MG tablet Take 1 tablet (0.6 mg total) by mouth 2 (two) times daily.   cyclobenzaprine 10 MG tablet Commonly known as: FLEXERIL Take 10 mg by mouth at bedtime as needed for muscle spasms.   diclofenac sodium 1 % Gel Commonly known as: VOLTAREN Apply 4 g topically See admin instructions. Apply 4 grams to right knee, right hip, and/or lower back up to four times daily as needed for pain   furosemide 20 MG tablet Commonly known as: LASIX Take 1 tablet (20 mg total) by mouth daily. Start taking on: November 30, 2019   guaiFENesin 100 MG/5ML Soln Commonly known as: ROBITUSSIN Take 5 mLs (100 mg total) by mouth every 4 (four) hours as needed for cough or to loosen phlegm.   HYDROcodone-acetaminophen 5-325 MG tablet Commonly known as: NORCO/VICODIN Take 1 tablet by mouth 3 (three) times daily as needed (for pain).   menthol-cetylpyridinium 3 MG lozenge Commonly known as: CEPACOL Take 1 lozenge (3 mg total) by mouth as needed for sore throat.   pantoprazole 40 MG tablet Commonly known as: PROTONIX Take 1 tablet (40 mg total) by mouth 2 (two) times daily.   PARoxetine 40 MG tablet Commonly known as: PAXIL Take 60 mg by mouth at bedtime. Taking 1 & 1/2 tablet = '60mg'$    phenol 1.4 % Liqd Commonly known as: CHLORASEPTIC Use as directed 1 spray in the mouth or throat as needed for throat irritation / pain.   sacubitril-valsartan 24-26 MG Commonly known as: ENTRESTO Take 1 tablet by mouth 2 (two) times daily.   simvastatin 20 MG tablet Commonly known as: ZOCOR Take 10 mg by mouth at bedtime.   spironolactone 25 MG tablet Commonly known as: ALDACTONE Take 1 tablet (25 mg total) by mouth daily. Start taking on: November 30, 2019   tamsulosin 0.4 MG Caps capsule Commonly known as: FLOMAX Take 1 capsule (0.4 mg total) by mouth at bedtime. What  changed:   how much to take  when to take this   tetrahydrozoline 0.05 % ophthalmic solution Place 2 drops into both eyes 2 (two) times daily as  needed (Dry eyes).   umeclidinium bromide 62.5 MCG/INH Aepb Commonly known as: INCRUSE ELLIPTA Inhale 1 puff into the lungs daily.       Disposition   The patient will be discharged in stable condition to home.   Follow-up Information    Deboraha Sprang, MD Follow up on 02/01/2020.   Specialty: Cardiology Why: at 3 pm for post hospital follow up. Discuss device plan pending course.  Contact information: [address] [address] [phone]        Larey Dresser, MD. Go on 12/14/2019.   Specialty: Cardiology Why: 10:00 AM, [facility], Entrance C Contact information: [place] [address] [place] [facility]. Go on 01/10/2020.   Specialty: Cardiology Why: 11:00 AM Contact information: [address] [id number] [place] [phone]                Duration of Discharge Encounter: Greater than 35 minutes   Signed, Nelida Gores  11/29/2019, 11:54 AM

## 2019-12-03 ENCOUNTER — Telehealth (HOSPITAL_COMMUNITY): Payer: Self-pay | Admitting: Internal Medicine

## 2019-12-03 ENCOUNTER — Other Ambulatory Visit (HOSPITAL_COMMUNITY): Payer: Self-pay | Admitting: *Deleted

## 2019-12-03 MED ORDER — FUROSEMIDE 20 MG PO TABS: 20.0000 mg | ORAL_TABLET | Freq: Every day | ORAL | 5 refills | Status: DC

## 2019-12-03 MED ORDER — SACUBITRIL-VALSARTAN 24-26 MG PO TABS: 1.0000 | ORAL_TABLET | Freq: Two times a day (BID) | ORAL | 5 refills | Status: DC

## 2019-12-03 NOTE — Telephone Encounter (Signed)
Looks like meds were ordered by Ellen Henri, PAC during d/c. I will forward call to Heart failure clinic as pt has appt 12/14/19 with Dr. Aundra Dubin.

## 2019-12-03 NOTE — Telephone Encounter (Signed)
Information faxed to [facility]

## 2019-12-03 NOTE — Telephone Encounter (Signed)
    1. Which medications need to be refilled? (please list name of each medication and dose if known)  sacubitril-valsartan (ENTRESTO) 24-26 MG furosemide (LASIX) 20 MG tablet  2. Which pharmacy/location (including street and city if local pharmacy) is medication to be sent to? [place] was calling to clarify the medication refill for this patient and if this rx was written as a result of an outpatient visit or an inpatient visit. The coverage of the medication depends on the type of visit it was written for.   The [facility] would also need records from the visit faxed to them at [phone]

## 2019-12-13 ENCOUNTER — Other Ambulatory Visit: Payer: Self-pay

## 2019-12-13 ENCOUNTER — Emergency Department (HOSPITAL_COMMUNITY)
Admission: EM | Admit: 2019-12-13 | Discharge: 2019-12-13 | Disposition: A | Discharge status: Home / Self Care | Payer: No Typology Code available for payment source | Attending: Emergency Medicine | Admitting: Emergency Medicine

## 2019-12-13 ENCOUNTER — Emergency Department (HOSPITAL_COMMUNITY): Payer: No Typology Code available for payment source

## 2019-12-13 DIAGNOSIS — C61 Malignant neoplasm of prostate: Secondary | ICD-10-CM | POA: Diagnosis not present

## 2019-12-13 DIAGNOSIS — J9621 Acute and chronic respiratory failure with hypoxia: Secondary | ICD-10-CM | POA: Insufficient documentation

## 2019-12-13 DIAGNOSIS — Z20822 Contact with and (suspected) exposure to covid-19: Secondary | ICD-10-CM | POA: Diagnosis not present

## 2019-12-13 DIAGNOSIS — J441 Chronic obstructive pulmonary disease with (acute) exacerbation: Secondary | ICD-10-CM | POA: Insufficient documentation

## 2019-12-13 DIAGNOSIS — I429 Cardiomyopathy, unspecified: Secondary | ICD-10-CM | POA: Diagnosis not present

## 2019-12-13 DIAGNOSIS — I10 Essential (primary) hypertension: Secondary | ICD-10-CM | POA: Insufficient documentation

## 2019-12-13 DIAGNOSIS — R05 Cough: Secondary | ICD-10-CM | POA: Diagnosis not present

## 2019-12-13 DIAGNOSIS — I319 Disease of pericardium, unspecified: Secondary | ICD-10-CM | POA: Diagnosis not present

## 2019-12-13 DIAGNOSIS — I5022 Chronic systolic (congestive) heart failure: Secondary | ICD-10-CM | POA: Diagnosis not present

## 2019-12-13 DIAGNOSIS — Z79899 Other long term (current) drug therapy: Secondary | ICD-10-CM | POA: Insufficient documentation

## 2019-12-13 DIAGNOSIS — R079 Chest pain, unspecified: Secondary | ICD-10-CM | POA: Diagnosis not present

## 2019-12-13 DIAGNOSIS — I2510 Atherosclerotic heart disease of native coronary artery without angina pectoris: Secondary | ICD-10-CM | POA: Insufficient documentation

## 2019-12-13 DIAGNOSIS — Z87891 Personal history of nicotine dependence: Secondary | ICD-10-CM | POA: Diagnosis not present

## 2019-12-13 DIAGNOSIS — R0602 Shortness of breath: Secondary | ICD-10-CM | POA: Diagnosis present

## 2019-12-13 LAB — COMPREHENSIVE METABOLIC PANEL
ALT: 14 U/L (ref 0–44)
AST: 20 U/L (ref 15–41)
Albumin: 3.6 g/dL (ref 3.5–5.0)
Alkaline Phosphatase: 30 U/L — ABNORMAL LOW (ref 38–126)
Anion gap: 8 (ref 5–15)
BUN: 6 mg/dL — ABNORMAL LOW (ref 8–23)
CO2: 26 mmol/L (ref 22–32)
Calcium: 9.1 mg/dL (ref 8.9–10.3)
Chloride: 105 mmol/L (ref 98–111)
Creatinine, Ser: 1.03 mg/dL (ref 0.61–1.24)
GFR calc Af Amer: >60 mL/min (ref >60)
GFR calc non Af Amer: >60 mL/min (ref >60)
Glucose, Bld: 108 mg/dL — ABNORMAL HIGH (ref 70–99)
Potassium: 4.2 mmol/L (ref 3.5–5.1)
Sodium: 139 mmol/L (ref 135–145)
Total Bilirubin: 0.5 mg/dL (ref 0.3–1.2)
Total Protein: 6.3 g/dL — ABNORMAL LOW (ref 6.5–8.1)

## 2019-12-13 LAB — CBC WITH DIFFERENTIAL/PLATELET
Abs Immature Granulocytes: 0.01 10*3/uL (ref 0.00–0.07)
Basophils Absolute: 0 10*3/uL (ref 0.0–0.1)
Basophils Relative: 1 %
Eosinophils Absolute: 0.3 10*3/uL (ref 0.0–0.5)
Eosinophils Relative: 6 %
HCT: 40.2 % (ref 39.0–52.0)
Hemoglobin: 13 g/dL (ref 13.0–17.0)
Immature Granulocytes: 0 %
Lymphocytes Relative: 19 %
Lymphs Abs: 1 10*3/uL (ref 0.7–4.0)
MCH: 27.8 pg (ref 26.0–34.0)
MCHC: 32.3 g/dL (ref 30.0–36.0)
MCV: 85.9 fL (ref 80.0–100.0)
Monocytes Absolute: 0.6 10*3/uL (ref 0.1–1.0)
Monocytes Relative: 12 %
Neutro Abs: 3.2 10*3/uL (ref 1.7–7.7)
Neutrophils Relative %: 62 %
Platelets: 204 10*3/uL (ref 150–400)
RBC: 4.68 MIL/uL (ref 4.22–5.81)
RDW: 13.9 % (ref 11.5–15.5)
WBC: 5.2 10*3/uL (ref 4.0–10.5)
nRBC: 0 % (ref 0.0–0.2)

## 2019-12-13 LAB — TROPONIN I (HIGH SENSITIVITY)
Troponin I (High Sensitivity): 7 ng/L (ref <18)
Troponin I (High Sensitivity): 7 ng/L (ref <18)

## 2019-12-13 LAB — BRAIN NATRIURETIC PEPTIDE: B Natriuretic Peptide: 292.8 pg/mL — ABNORMAL HIGH (ref 0.0–100.0)

## 2019-12-13 LAB — SARS CORONAVIRUS 2 BY RT PCR (HOSPITAL ORDER, PERFORMED IN ~~LOC~~ HOSPITAL LAB): SARS Coronavirus 2: NEGATIVE

## 2019-12-13 MED ORDER — PREDNISONE 20 MG PO TABS
40.0000 mg | ORAL_TABLET | Freq: Once | ORAL | Status: AC
  Administered 2019-12-13: 40 mg via ORAL
  Filled 2019-12-13: qty 2

## 2019-12-13 MED ORDER — GUAIFENESIN-DM 100-10 MG/5ML PO SYRP
5.0000 mL | ORAL_SOLUTION | Freq: Three times a day (TID) | ORAL | 0 refills | Status: DC | PRN
Start: 2019-12-13 — End: 2020-02-18

## 2019-12-13 MED ORDER — ALBUTEROL SULFATE HFA 108 (90 BASE) MCG/ACT IN AERS
4.0000 | INHALATION_SPRAY | Freq: Once | RESPIRATORY_TRACT | Status: AC
  Administered 2019-12-13: 4 via RESPIRATORY_TRACT
  Filled 2019-12-13: qty 6.7

## 2019-12-13 MED ORDER — PREDNISONE 20 MG PO TABS
40.0000 mg | ORAL_TABLET | Freq: Every day | ORAL | 0 refills | Status: DC
Start: 2019-12-14 — End: 2020-01-16
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
## 2019-12-13 NOTE — ED Provider Notes (Signed)
  Physical Exam  BP 109/70   Pulse 83   Temp 98.8 F (37.1 C) (Oral)   Resp (!) 28   Ht 5\' 11"  (1.803 m)   Wt 83.4 kg   SpO2 97%   BMI 25.63 kg/m   Physical Exam  ED Course/Procedures     Procedures  MDM  Received patient signout.  Chest pain.  Recent pericarditis.  Also some URI symptoms.  X-ray reassuring.  Has some wheezes.  Frequent coughs.  Seen by cardiology and cleared from their standpoint.  Has follow-up tomorrow.  Still frequent coughs.  With the wheezing will give short course of prednisone.  Has follow-up with her fever clinic tomorrow.  They can be asked about the prednisone and the pericarditis and heart failure.  Also given prescription for guaifenesin dextromethorphan cough medicine.  Discharge home       Davonna Belling, MD 12/13/19 1701
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
## 2019-12-13 NOTE — ED Triage Notes (Signed)
To ED for eval of sob and cp since this am after taking neb tx. States he slept fine last pm. Woke this am to do his am neb and became very sob - continuously coughing. Pt with pacemaker. Diaphoretic. CP. Hypotensive.

## 2019-12-13 NOTE — Discharge Instructions (Signed)
Follow-up with the heart failure clinic tomorrow.  Get their opinion on the steroids and how it would relate to your heart failure and pericarditis.

## 2019-12-14 ENCOUNTER — Encounter (HOSPITAL_COMMUNITY): Payer: Self-pay | Admitting: Cardiology

## 2019-12-14 ENCOUNTER — Ambulatory Visit (HOSPITAL_COMMUNITY)
Admit: 2019-12-14 | Discharge: 2019-12-14 | Disposition: A | Discharge status: Home / Self Care | Payer: No Typology Code available for payment source | Attending: Cardiology | Admitting: Cardiology

## 2019-12-14 VITALS — BP 110/64 | HR 82 | Ht 71.0 in | Wt 184.4 lb

## 2019-12-14 DIAGNOSIS — I10 Essential (primary) hypertension: Secondary | ICD-10-CM

## 2019-12-14 DIAGNOSIS — Z7952 Long term (current) use of systemic steroids: Secondary | ICD-10-CM | POA: Insufficient documentation

## 2019-12-14 DIAGNOSIS — Z87891 Personal history of nicotine dependence: Secondary | ICD-10-CM | POA: Diagnosis not present

## 2019-12-14 DIAGNOSIS — Z8546 Personal history of malignant neoplasm of prostate: Secondary | ICD-10-CM | POA: Diagnosis not present

## 2019-12-14 DIAGNOSIS — I442 Atrioventricular block, complete: Secondary | ICD-10-CM | POA: Diagnosis not present

## 2019-12-14 DIAGNOSIS — E7800 Pure hypercholesterolemia, unspecified: Secondary | ICD-10-CM | POA: Insufficient documentation

## 2019-12-14 DIAGNOSIS — Z79899 Other long term (current) drug therapy: Secondary | ICD-10-CM | POA: Diagnosis not present

## 2019-12-14 DIAGNOSIS — Z950 Presence of cardiac pacemaker: Secondary | ICD-10-CM | POA: Diagnosis not present

## 2019-12-14 DIAGNOSIS — I428 Other cardiomyopathies: Secondary | ICD-10-CM | POA: Insufficient documentation

## 2019-12-14 DIAGNOSIS — F4310 Post-traumatic stress disorder, unspecified: Secondary | ICD-10-CM | POA: Insufficient documentation

## 2019-12-14 DIAGNOSIS — I2510 Atherosclerotic heart disease of native coronary artery without angina pectoris: Secondary | ICD-10-CM | POA: Diagnosis not present

## 2019-12-14 DIAGNOSIS — I5022 Chronic systolic (congestive) heart failure: Secondary | ICD-10-CM

## 2019-12-14 DIAGNOSIS — I110 Hypertensive heart disease with heart failure: Secondary | ICD-10-CM | POA: Insufficient documentation

## 2019-12-14 DIAGNOSIS — Z791 Long term (current) use of non-steroidal anti-inflammatories (NSAID): Secondary | ICD-10-CM | POA: Insufficient documentation

## 2019-12-14 DIAGNOSIS — I447 Left bundle-branch block, unspecified: Secondary | ICD-10-CM | POA: Insufficient documentation

## 2019-12-14 DIAGNOSIS — J449 Chronic obstructive pulmonary disease, unspecified: Secondary | ICD-10-CM | POA: Diagnosis not present

## 2019-12-14 DIAGNOSIS — I313 Pericardial effusion (noninflammatory): Secondary | ICD-10-CM | POA: Insufficient documentation

## 2019-12-14 MED ORDER — DAPAGLIFLOZIN PROPANEDIOL 10 MG PO TABS
10.0000 mg | ORAL_TABLET | Freq: Every day | ORAL | 11 refills | Status: DC
Start: 2019-12-14 — End: 2020-01-16

## 2019-12-14 MED ORDER — FUROSEMIDE 20 MG PO TABS: 20.0000 mg | ORAL_TABLET | Freq: Every day | ORAL | 5 refills | Status: DC | PRN

## 2019-12-14 NOTE — Patient Instructions (Addendum)
No labs done today.   START Farxiga 10mg ( 1 tablet) by mouth daily.  DECREASE Lasix 20mg (1 tablet) by mouth as needed.   No other medication changes were made. Please continue all other medications as prescribed.   Your physician recommends that you schedule a follow-up appointment in: 1 week for a lab only visit, 3-4 weeks with our Clinic Pharmacist, Lauren and in 3-4 weeks with Dr. Aundra Dubin.  If you have any questions or concerns before your next appointment please send [place] a message through [place] or call our office at [phone].    TO LEAVE A MESSAGE FOR THE NURSE SELECT OPTION 2, PLEASE LEAVE A MESSAGE INCLUDING: . YOUR NAME . DATE OF BIRTH . CALL BACK NUMBER . REASON FOR CALL**this is important as we prioritize the call backs  YOU WILL RECEIVE A CALL BACK THE SAME DAY AS LONG AS YOU CALL BEFORE 4:00 PM   Do the following things EVERYDAY: 1) Weigh yourself in the morning before breakfast. Write it down and keep it in a log. 2) Take your medicines as prescribed 3) Eat low salt foods--Limit salt (sodium) to 2000 mg per day.  4) Stay as active as you can everyday 5) Limit all fluids for the day to less than 2 liters   At the [facility], you and your health needs are our priority. As part of our continuing mission to provide you with exceptional heart care, we have created designated Provider Care Teams. These Care Teams include your primary Cardiologist (physician) and Advanced Practice Providers (APPs- Physician Assistants and Nurse Practitioners) who all work together to provide you with the care you need, when you need it.   You may see any of the following providers on your designated Care Team at your next follow up: [name] Dr Glori Bickers . Dr Loralie Champagne . Darrick Grinder, NP . Lyda Jester, PA . Audry Riles, PharmD   Please be sure to bring in all your medications bottles to every appointment.

## 2019-12-14 NOTE — Progress Notes (Signed)
Advanced [facility] Failure Clinic Note   Referring Physician: PCP: [facility] PCP-Cardiologist: Dr. Aundra Dubin EP- Dr. Caryl Comes   HPI: 59 y.o.with history of complete heart block s/p dual chamber Medtronic PPM 2/21, HTN, prior smoking/?COPD. He was admitted in 2/21 with CHB and syncope, echo in 2/21 with EF 60-65%. PPM was placed. Initially did ok after PPM.   In June 2021, he started to develop dyspnea and wheezing + cough. He went to the ER, thought to have COPD exacerbation and started on albuterol and steroids. He was back in the ER 1 week later with wheezing and hypoxia, was briefly on bipap and given antibiotics + a prolonged steroid taper. He returned to the ED on 7/9 this time with chest pain radiating to his back. A CTchestdissection protocol was performed that was notable for "new cardiomegaly" but not aortic pathology. There was no effusion on this CT scan. He was treated with dilaudid and toradol and discharged home pain free.  Shortly after, he developed cough and new orthopnea. He then developed chest pain. On 7/17, he went back to the ER and was admitted. He was hypertension, HS-TnI not elevated. CTA chest showed no PE but there was a new moderate pericardial effusion. Chest pain was pleuritic. CRP elevated 20, ESR 53. Started on colchicine and prednisone continued in setting of suspected myopericarditis. COVID negative, flu negative, RSV negative. On azithromycin/ceftriaxone for possible PNA. Mildly elevated PCT at 0.33.   Echo was done, showing LV EF <20%, RV moderately decreased systolic function, small-moderate pericardial effusion surrounding heart w/o evidence for tamponade.   RHC/LHC showed no coronary disease, normal filling pressures, normal cardiac output.   Cardiac MRI did not show evidence for myocarditis (did show evidence for pericarditis). There was no evidence for cardiac sarcoidosis. Given no CAD on cath and no LGE on MRI, fall in EF may  well be due to chronic RV pacing. Patient was markedly dyssynchronous =>Would favor upgrading device to CRT. Regarding ICD, with nonischemic and possibly reversible cause (as well as no LGE), think we may be able to avoid defibrillator.Still not clear why the patient developed CHB. EP was consulted and recommended repeating echo in 4-6 weeks. If no improvement will need CRT upgrade.  He was placed on GDMT for systolic HF, Entresto, Spiro and bisoprolol. Lasix 20 mg daily for volume control. He was continued on colchicine 0.6 mg bid for pericarditis, to be continued x 3 months.  Yesterday, he went back to the ED w/ complaints of recurrent cough and wheezing. Was noted to be hypotensive in the ED w/ BP 88/72 but was not symptomatic. Denied dizziness. No syncope/ near syncope. COVID negative. General cardiology was consulted and cardiac w/u fairly unremarkable. Symptoms not felt to be primarily related to CHF. Felt likely COPD flare.  Labs notable for creatinine 1.03, high-sensitivity troponin was 7 > 7, BNP 292.  Chest x-ray showed clear lungs.  EKG showed sinus rhythm, rate 80, left bundle branch block. No evidence of volume overload on exam. He was given tx for COPDE. Did not require admission. HF regimen was continued w/o change.   He now presents to clinic for f/u. Feeling much better. Symptoms improved. No further dyspnea/ wheezing. BP is also stable 110/64. Took his AM meds this morning. Checks BP regularly at home. Baseline SBPs ~116, per his report. Home daily wts have been stable. No wt gain. Denies exertional dyspnea. No orthopnea/PND. Fully compliant w/ colchicine. Denies pleuritic CP.    Review of Systems: [  y] = yes, [ ] = no   General: Weight gain [ ]; Weight loss [ ]; Anorexia [ ]; Fatigue [ ]; Fever [ ]; Chills [ ]; Weakness [ ]  Cardiac: Chest pain/pressure [ ]; Resting SOB [ ]; Exertional SOB [ ]; Orthopnea [ ]; Pedal Edema [ ]; Palpitations [ ]; Syncope [ ]; Presyncope [ ];  Paroxysmal nocturnal dyspnea[ ]  Pulmonary: Cough [ ]; Wheezing[ ]; Hemoptysis[ ]; Sputum [ ]; Snoring [ ]  GI: Vomiting[ ]; Dysphagia[ ]; Melena[ ]; Hematochezia [ ]; Heartburn[ ]; Abdominal pain [ ]; Constipation [ ]; Diarrhea [ ]; BRBPR [ ]  GU: Hematuria[ ]; Dysuria [ ]; Nocturia[ ]  Vascular: Pain in legs with walking [ ]; Pain in feet with lying flat [ ]; Non-healing sores [ ]; Stroke [ ]; TIA [ ]; Slurred speech [ ];  Neuro: Headaches[ ]; Vertigo[ ]; Seizures[ ]; Paresthesias[ ];Blurred vision [ ]; Diplopia [ ]; Vision changes [ ]  Ortho/Skin: Arthritis [ ]; Joint pain [ ]; Muscle pain [ ]; Joint swelling [ ]; Back Pain [ ]; Rash [ ]  Psych: Depression[ ]; Anxiety[ ]  Heme: Bleeding problems [ ]; Clotting disorders [ ]; Anemia [ ]  Endocrine: Diabetes [ ]; Thyroid dysfunction[ ]   Past Medical History:  Diagnosis Date  . Coronary artery disease   . High cholesterol   . Hypertension   . 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 Prostate cancer ([place])   . PTSD (post-traumatic stress disorder)     Current Outpatient Medications  Medication Sig Dispense Refill  . acetaminophen (TYLENOL) 500 MG tablet Take 500-1,000 mg by mouth every 6 (six) hours as needed for mild pain or headache.    . albuterol (PROVENTIL HFA;VENTOLIN HFA) 108 (90 BASE) MCG/ACT inhaler Inhale 2 puffs into the lungs 4 (four) times daily as needed for wheezing or shortness of breath.     . bisoprolol (ZEBETA) 5 MG tablet Take 1 tablet (5 mg total) by mouth daily. 30 tablet 5  . cetirizine (ZYRTEC) 10 MG tablet Take 10 mg by mouth daily as needed (for seasonal allergies).    . colchicine 0.6 MG tablet Take 1 tablet (0.6 mg total) by mouth 2 (two) times daily. 60 tablet 2  . cyclobenzaprine (FLEXERIL) 10 MG tablet Take 10 mg by mouth at bedtime as needed for muscle spasms.     . diclofenac sodium (VOLTAREN) 1 % GEL Apply 4 g topically See admin instructions. Apply 4 grams to right knee, right hip, and/or lower  back up to four times daily as needed for pain    . furosemide (LASIX) 20 MG tablet Take 1 tablet (20 mg total) by mouth daily. 30 tablet 5  . guaiFENesin (ROBITUSSIN) 100 MG/5ML SOLN Take 5 mLs (100 mg total) by mouth every 4 (four) hours as needed for cough or to loosen phlegm. 236 mL 0  . guaiFENesin-dextromethorphan (ROBITUSSIN DM) 100-10 MG/5ML syrup Take 5 mLs by mouth 3 (three) times daily as needed for cough. 118 mL 0  . HYDROcodone-acetaminophen (NORCO/VICODIN) 5-325 MG tablet Take 1 tablet by mouth 3 (three) times daily as needed (for pain).     [name] ipratropium-albuterol (DUONEB) 0.5-2.5 (3) MG/3ML SOLN Take 3 mLs by nebulization every 6 (six) hours.    [name] menthol-cetylpyridinium (CEPACOL) 3 MG lozenge Take 1 lozenge (3 mg total) by mouth as needed for sore throat. 100 tablet 12  . pantoprazole (PROTONIX) 40 MG tablet  Take 1 tablet (40 mg total) by mouth 2 (two) times daily. 60 tablet 0  . PARoxetine (PAXIL) 40 MG tablet Take 60 mg by mouth at bedtime. Taking 1 & 1/2 tablet = 35m    . phenol (CHLORASEPTIC) 1.4 % LIQD Use as directed 1 spray in the mouth or throat as needed for throat irritation / pain.  0  . predniSONE (DELTASONE) 20 MG tablet Take 2 tablets (40 mg total) by mouth daily. 6 tablet 0  . sacubitril-valsartan (ENTRESTO) 24-26 MG Take 1 tablet by mouth 2 (two) times daily. 60 tablet 5  . simvastatin (ZOCOR) 20 MG tablet Take 10 mg by mouth at bedtime.    .Marland Kitchenspironolactone (ALDACTONE) 25 MG tablet Take 1 tablet (25 mg total) by mouth daily. 30 tablet 5  . tamsulosin (FLOMAX) 0.4 MG CAPS capsule Take 1 capsule (0.4 mg total) by mouth at bedtime. 30 capsule 0  . tetrahydrozoline 0.05 % ophthalmic solution Place 2 drops into both eyes 2 (two) times daily as needed (Dry eyes).    .Marland Kitchenumeclidinium bromide (INCRUSE ELLIPTA) 62.5 MCG/INH AEPB Inhale 1 puff into the lungs daily. 1 each 0   No current facility-administered medications for this encounter.    No Known Allergies    Social  History   Socioeconomic History  . Marital status: Married    Spouse name: Not on file  . Number of children: Not on file  . Years of education: Not on file  . Highest education level: Not on file  Occupational History  . Not on file  Tobacco Use  . Smoking status: Former Smoker    Types: Cigarettes  . Smokeless tobacco: Never Used  Vaping Use  . Vaping Use: Never used  Substance and Sexual Activity  . Alcohol use: Yes    Alcohol/week: 6.0 standard drinks    Types: 6 Cans of beer per week    Comment: socially  . Drug use: No  . Sexual activity: Not on file  Other Topics Concern  . Not on file  Social History Narrative  . Not on file   Social Determinants of Health   Financial Resource Strain:   . Difficulty of Paying Living Expenses:   Food Insecurity:   . Worried About RCharity fundraiserin the Last Year:   . RArboriculturistin the Last Year:   Transportation Needs:   . LFilm/video editor(Medical):   .Marland KitchenLack of Transportation (Non-Medical):   Physical Activity:   . Days of Exercise per Week:   . Minutes of Exercise per Session:   Stress:   . Feeling of Stress :   Social Connections:   . Frequency of Communication with Friends and Family:   . Frequency of Social Gatherings with Friends and Family:   . Attends Religious Services:   . Active Member of Clubs or Organizations:   . Attends CArchivistMeetings:   .Marland KitchenMarital Status:   Intimate Partner Violence:   . Fear of Current or Ex-Partner:   . Emotionally Abused:   .Marland KitchenPhysically Abused:   . Sexually Abused:       Family History  Problem Relation Age of Onset  . Prostate cancer Neg Hx     Vitals:   12/14/19 0951  BP: 110/64  Pulse: 82  SpO2: 96%  Weight: 83.6 kg  Height: 5' 11" (1.803 m)     PHYSICAL EXAM: General:  Well appearing. No respiratory difficulty HEENT: normal Neck:  supple. no JVD. Carotids 2+ bilat; no bruits. No lymphadenopathy or thyromegaly appreciated. Cor: PMI  nondisplaced. Regular rate & rhythm. No rubs, gallops or murmurs. Lungs: clear Abdomen: soft, nontender, nondistended. No hepatosplenomegaly. No bruits or masses. Good bowel sounds. Extremities: no cyanosis, clubbing, rash, edema Neuro: alert & oriented x 3, cranial nerves grossly intact. moves all 4 extremities w/o difficulty. Affect pleasant.  ECG: not performed   ASSESSMENT & PLAN:  1. Chronic Systolic Heart Failure: Echo in 2/21 prior to PPM placement showed EF 60-65%. Echo 7/21 withLV EF < 20%, RV moderately decreased systolic function, small-moderate pericardial effusion surrounding heart w/o evidence for tamponade. Most recent admit 7/21 patient had symptoms concerning for viral myopericarditis with pericardial effusion and pleuritic chest pain =>however, troponin not significantly elevated, pointing away from a myocarditis component. RHC/LHC showed no coronary disease, normal filling pressures, normal cardiac output. Nonischemic cardiomyopathy, but cause uncertain.Cardiac MRI did not show evidence for myocarditis (did show evidence for pericarditis). There was no evidence for cardiac sarcoidosis. Given no CAD on cath and no LGE on MRI, fall in EF may well be due to chronic RV pacing. Patient was markedly dyssynchronous =>Would favor upgrading device to CRT if EF fails to improve w/ medical therapy (repeat echo scheduled 9/2).  - NYHA Class II symptoms.  - not grossly volume overloaded on exam, however BNP in the ED yesterday mildly elevated at 292 (down from recent admit, 342) - w/ soft BP and recent episode of hypotension, will hold on Entresto titration today - Continue Entresto 24-26 mg bid - Continue Spironolactone 25 mg daily  - Start SGLT2i, Farxiga 10 mg daily - Change lasix to PRN (take 20 mg if > 3 lb gain in 24 hr or > 5 lb in 1 week)  - Continue bisoprolol 5 mg daily  - Repeat BMP in 7 days after [place] start - f/u w/ pharmD in 2-3 weeks for further med titration  (attempt Entresto titration if BP stable) - Plan repeat echo 9/2. If EF remains < 35%, upgrade to CRT (has f/u w/ Dr. Caryl Comes 9/24)  2. Pericarditis - no further CP - continue colchicine 0.6 mg bid for duration of 3 months   3. H/o CHB - s/p PPM 2/21 - may need CRT upgrade per above, pending f/u echo next month - will f/u w/ Dr. Caryl Comes after echo on 9/24   4. COPD - recent acute flare, treated in ED 8/5 - symptoms improved - continue home inhalers/ short course of prednisone    F/u w/ Dr. Aundra Dubin in 6 weeks.   Lyda Jester, PA-C 12/14/19

## 2019-12-21 ENCOUNTER — Ambulatory Visit (HOSPITAL_COMMUNITY)
Admission: RE | Admit: 2019-12-21 | Discharge: 2019-12-21 | Disposition: A | Discharge status: Home / Self Care | Payer: No Typology Code available for payment source | Source: Ambulatory Visit | Attending: Cardiology | Admitting: Cardiology

## 2019-12-21 ENCOUNTER — Other Ambulatory Visit: Payer: Self-pay

## 2019-12-21 DIAGNOSIS — I442 Atrioventricular block, complete: Secondary | ICD-10-CM | POA: Insufficient documentation

## 2019-12-21 DIAGNOSIS — I10 Essential (primary) hypertension: Secondary | ICD-10-CM | POA: Insufficient documentation

## 2019-12-21 LAB — BASIC METABOLIC PANEL
Anion gap: 11 (ref 5–15)
BUN: 9 mg/dL (ref 8–23)
CO2: 25 mmol/L (ref 22–32)
Calcium: 9.4 mg/dL (ref 8.9–10.3)
Chloride: 101 mmol/L (ref 98–111)
Creatinine, Ser: 1.16 mg/dL (ref 0.61–1.24)
GFR calc Af Amer: >60 mL/min (ref >60)
GFR calc non Af Amer: >60 mL/min (ref >60)
Glucose, Bld: 113 mg/dL — ABNORMAL HIGH (ref 70–99)
Potassium: 4.1 mmol/L (ref 3.5–5.1)
Sodium: 137 mmol/L (ref 135–145)

## 2019-12-31 ENCOUNTER — Other Ambulatory Visit (HOSPITAL_COMMUNITY): Payer: Self-pay | Admitting: *Deleted

## 2019-12-31 ENCOUNTER — Ambulatory Visit (INDEPENDENT_AMBULATORY_CARE_PROVIDER_SITE_OTHER): Payer: No Typology Code available for payment source | Admitting: *Deleted

## 2019-12-31 ENCOUNTER — Telehealth (HOSPITAL_COMMUNITY): Payer: Self-pay | Admitting: Licensed Clinical Social Worker

## 2019-12-31 DIAGNOSIS — I442 Atrioventricular block, complete: Secondary | ICD-10-CM | POA: Diagnosis not present

## 2019-12-31 LAB — CUP PACEART REMOTE DEVICE CHECK
Battery Remaining Longevity: 117 mo
Battery Voltage: 3.06 V
Brady Statistic AP VP Percent: 1.94 %
Brady Statistic AP VS Percent: 0 %
Brady Statistic AS VP Percent: 98.05 %
Brady Statistic AS VS Percent: 0.01 %
Brady Statistic RA Percent Paced: 1.91 %
Brady Statistic RV Percent Paced: 99.99 %
Date Time Interrogation Session: 20210822190356
Implantable Lead Implant Date: 20210222
Implantable Lead Implant Date: 20210222
Implantable Lead Location: 753859
Implantable Lead Location: 753860
Implantable Lead Model: 5076
Implantable Lead Model: 5076
Implantable Pulse Generator Implant Date: 20210222
Lead Channel Impedance Value: 285 Ohm
Lead Channel Impedance Value: 285 Ohm
Lead Channel Impedance Value: 361 Ohm
Lead Channel Impedance Value: 570 Ohm
Lead Channel Pacing Threshold Amplitude: 0.625 V
Lead Channel Pacing Threshold Amplitude: 0.875 V
Lead Channel Pacing Threshold Pulse Width: 0.4 ms
Lead Channel Pacing Threshold Pulse Width: 0.4 ms
Lead Channel Sensing Intrinsic Amplitude: 1.75 mV
Lead Channel Sensing Intrinsic Amplitude: 1.75 mV
Lead Channel Setting Pacing Amplitude: 1.5 V
Lead Channel Setting Pacing Amplitude: 2.5 V
Lead Channel Setting Pacing Pulse Width: 0.4 ms
Lead Channel Setting Sensing Sensitivity: 4 mV

## 2019-12-31 MED ORDER — BISOPROLOL FUMARATE 5 MG PO TABS: 5.0000 mg | ORAL_TABLET | Freq: Every day | ORAL | 5 refills | Status: DC

## 2019-12-31 MED ORDER — SPIRONOLACTONE 25 MG PO TABS: 25.0000 mg | ORAL_TABLET | Freq: Every day | ORAL | 5 refills | Status: DC

## 2019-12-31 MED ORDER — FUROSEMIDE 20 MG PO TABS: 20.0000 mg | ORAL_TABLET | Freq: Every day | ORAL | 5 refills | Status: AC | PRN

## 2019-12-31 MED ORDER — COLCHICINE 0.6 MG PO TABS: 0.6000 mg | ORAL_TABLET | Freq: Two times a day (BID) | ORAL | 2 refills | Status: DC

## 2019-12-31 NOTE — Telephone Encounter (Signed)
CSW received call from staff member at [facility] who was with patient.  Patient reporting he only has 1 day left of several medications being prescribed by our clinic: colchicine, bisoprolol, spirolactone, and furosemide and is requesting refills be sent in to [facility].  CSW had triage staff send in the refills- [facility] staff member reports they should be able to overnight him the medications so should have them tomorrow.  No further needs at this time.  Jorge Ny, LCSW Clinical Social Worker Advanced Heart Failure Clinic Desk#: [phone] Cell#: [phone]

## 2020-01-04 NOTE — Progress Notes (Signed)
Referring Physician: PCP: [facility] PCP-Cardiologist: Dr. Aundra Dubin EP- Dr. Caryl Comes   HPI:  59 y.o.with history of complete heart block s/p dual chamber Medtronic PPM 2/21, HTN, prior smoking/?COPD. He was admitted in 2/21 with CHB and syncope, echo in 2/21 with EF 60-65%. PPM was placed. Initially did ok after PPM.   In June 2021, he started to develop dyspnea and wheezing + cough. He went to the ER, thoughtto have COPD exacerbation and started on albuterol and steroids. He was back in the ER 1 week later with wheezing and hypoxia, was briefly on bipap and given antibiotics + a prolonged steroid taper. He returned to the ED on 7/9 this time with chest pain radiating to his back. A CTchestdissection protocol was performed that was notable for "new cardiomegaly" but not aortic pathology. There was no effusion on this CT scan. He was treated with dilaudid and toradol and discharged home pain free.  Shortly after, he developed cough and new orthopnea. He then developed chest pain. On 7/17, he went back to the ER and was admitted. He was hypertensive. HS-TnI not elevated. CTA chest showed no PE but there was a new moderate pericardial effusion. Chest pain was pleuritic. CRP elevated 20, ESR 53. Started on colchicine and prednisone continued in setting of suspected myopericarditis. COVID negative, flu negative, RSV negative. On azithromycin/ceftriaxone for possible PNA. Mildly elevated PCT at 0.33.   Echo was done, showing LV EF <20%, RV moderately decreased systolic function, small-moderate pericardial effusion surrounding heart w/o evidence for tamponade.   RHC/LHC showed no coronary disease, normal filling pressures, normal cardiac output.  Cardiac MRI did not show evidence for myocarditis (did show evidence for pericarditis). There was no evidence for cardiac sarcoidosis. Given no CAD on cath and no LGE on MRI, fall in EF may well be due to chronic RV pacing.  Patient was markedly dyssynchronous =>Would favor upgrading device to CRT.Regarding ICD, with nonischemic and possibly reversible cause (as well as no LGE), think we may be able to avoid defibrillator.Still not clear why the patient developed CHB.EP was consulted and recommended repeating echo in 4-6 weeks. Ifno improvement, will need CRT upgrade.  He was placed on GDMT for systolic HF, Entresto, Spironolactone and bisoprolol. Furosemide 20 mg daily for volume control. He was continued on colchicine 0.6 mg bid for pericarditis, to be continued x 3 months.  12/13/19, he went back to the ED w/ complaints of recurrent cough and wheezing. Was noted to be hypotensive in the ED w/ BP 88/72 but was not symptomatic. Denied dizziness. No syncope/ near syncope. COVID negative. General cardiology was consulted and cardiac w/u fairly unremarkable. Symptoms not felt to be primarily related to CHF. Felt likely COPD flare. Chest x-ray showed clear lungs. EKG showed sinus rhythm, rate 80, left bundle branch block.No evidence of volume overload on exam. He was given tx for COPDE. Did not require admission. HF regimen was continued w/o change.   He recently presented to HF Clinic on 12/14/19 with Lyda Jester, PA-C. Reported feeling much better. Symptoms had improved. No further dyspnea/ wheezing. BP was also stable at 110/64. Reported taking his AM meds this morning. Checked BP regularly at home. Baseline SBPs ~116, per his report. Home daily weights had been stable. No weight gain. Denied exertional dyspnea. No orthopnea/PND. Fully compliant with colchicine. Denied pleuritic CP.   Today he returns to HF clinic for pharmacist medication titration. At last visit with PA-C, dapagliflozin 10 mg daily was initiated. Unfortunately, he  never received dapagliflozin from the [place] because it is not on their formulary. Overall he is feeling well today. No dizziness, lightheadedness, chest pain or palpitations. No  SOB/DOE. Walks 2 miles every day. His weight has been stable at home at around 189 lbs. No LEE, PND or orthopnea. Tolerating all medications.     HF Medications: Bisoprolol 5 mg daily Entresto 24/26 mg BID Spironolactone 25 mg daily Dapagliflozin 10 mg daily - not started (has not received from [place]) Furosemide 20 mg PRN  Has the patient been experiencing any side effects to the medications prescribed?  no  Does the patient have any problems obtaining medications due to transportation or finances?   No - receives all medications from [facility]  Understanding of regimen: good Understanding of indications: good Potential of compliance: good Patient understands to avoid NSAIDs. Patient understands to avoid decongestants.    Pertinent Lab Values (12/21/19): [name] Serum creatinine 1.16, BUN 9, Potassium 4.1, Sodium 137  Vital Signs: . Weight: 191 lbs (last clinic weight: 184.4 lbs) . Blood pressure: 102/65  . Heart rate: 61   Assessment: 1. Chronic Systolic Heart Failure: Echo in 2/21 prior to PPM placement showed EF 60-65%. Echo 7/21 withLV EF < 20%, RV moderately decreased systolic function, small-moderate pericardial effusion surrounding heart w/o evidence for tamponade. Most recent admit 7/21 patient had symptoms concerning for viral myopericarditis with pericardial effusion and pleuritic chest pain =>however, troponin not significantly elevated, pointing away from a myocarditis component. RHC/LHC showed no coronary disease, normal filling pressures, normal cardiac output. Nonischemic cardiomyopathy, but cause uncertain.Cardiac MRI did not show evidence for myocarditis (did show evidence for pericarditis). There was no evidence for cardiac sarcoidosis. Given no CAD on cath and no LGE on MRI, fall in EF may well be due to chronic RV pacing. Patient was markedly dyssynchronous =>Would favor upgrading device to CRT if EF fails to improve w/ medical therapy. - Echo today  pending - NYHA Class II symptoms, euvolemic on exam - Continue furosemide 20 mg daily PRN  - Continue bisoprolol 5 mg daily  - Continue Entresto 24-26 mg BID. BP too low for uptitration today.  - Continue Spironolactone 25 mg daily  - Start empagliflozin 10 mg daily. Dapagliflozin discontinued as it is not on the [place] repeat echo 9/2. If EF remains < 35%, upgrade to CRT (has f/u w/ Dr. Caryl Comes 9/24)  2. Pericarditis - no further CP - continue colchicine 0.6 mg bid for duration of 3 months   3. H/o CHB - s/p PPM 2/21 - may need CRT upgrade per above, pending f/u echo today - will f/u w/ Dr. Caryl Comes after echo on 01/16/20   4. COPD - recent acute flare, treated in ED 8/5 - symptoms improved - continue home inhalers   Plan: 1) Medication changes: Based on clinical presentation, vital signs and recent labs will start empagliflozin 10 mg daily (dapagliflozin discontinued as it is not on the [place]) 2) Follow-up: 4 weeks with Dr. Rush Farmer, PharmD, BCPS, [place], [place] [facility] Pharmacist [phone]

## 2020-01-07 ENCOUNTER — Other Ambulatory Visit (HOSPITAL_COMMUNITY): Payer: Self-pay | Admitting: *Deleted

## 2020-01-07 DIAGNOSIS — I5022 Chronic systolic (congestive) heart failure: Secondary | ICD-10-CM

## 2020-01-07 NOTE — Progress Notes (Signed)
Remote pacemaker transmission.   

## 2020-01-10 ENCOUNTER — Ambulatory Visit (HOSPITAL_COMMUNITY): Payer: No Typology Code available for payment source

## 2020-01-16 ENCOUNTER — Ambulatory Visit (HOSPITAL_BASED_OUTPATIENT_CLINIC_OR_DEPARTMENT_OTHER)
Admission: RE | Admit: 2020-01-16 | Discharge: 2020-01-16 | Disposition: A | Discharge status: Home / Self Care | Payer: No Typology Code available for payment source | Source: Ambulatory Visit | Attending: Cardiology | Admitting: Cardiology

## 2020-01-16 ENCOUNTER — Ambulatory Visit (HOSPITAL_COMMUNITY)
Admission: RE | Admit: 2020-01-16 | Discharge: 2020-01-16 | Disposition: A | Discharge status: Home / Self Care | Payer: No Typology Code available for payment source | Source: Ambulatory Visit | Attending: Cardiology | Admitting: Cardiology

## 2020-01-16 ENCOUNTER — Other Ambulatory Visit: Payer: Self-pay

## 2020-01-16 VITALS — BP 102/65 | HR 61 | Wt 191.0 lb

## 2020-01-16 DIAGNOSIS — I5022 Chronic systolic (congestive) heart failure: Secondary | ICD-10-CM | POA: Diagnosis present

## 2020-01-16 DIAGNOSIS — Z79899 Other long term (current) drug therapy: Secondary | ICD-10-CM | POA: Insufficient documentation

## 2020-01-16 DIAGNOSIS — I442 Atrioventricular block, complete: Secondary | ICD-10-CM | POA: Insufficient documentation

## 2020-01-16 DIAGNOSIS — I110 Hypertensive heart disease with heart failure: Secondary | ICD-10-CM | POA: Insufficient documentation

## 2020-01-16 DIAGNOSIS — Z7901 Long term (current) use of anticoagulants: Secondary | ICD-10-CM | POA: Insufficient documentation

## 2020-01-16 DIAGNOSIS — I252 Old myocardial infarction: Secondary | ICD-10-CM | POA: Diagnosis not present

## 2020-01-16 DIAGNOSIS — I319 Disease of pericardium, unspecified: Secondary | ICD-10-CM | POA: Insufficient documentation

## 2020-01-16 DIAGNOSIS — J449 Chronic obstructive pulmonary disease, unspecified: Secondary | ICD-10-CM | POA: Insufficient documentation

## 2020-01-16 DIAGNOSIS — Z87891 Personal history of nicotine dependence: Secondary | ICD-10-CM | POA: Insufficient documentation

## 2020-01-16 DIAGNOSIS — I2510 Atherosclerotic heart disease of native coronary artery without angina pectoris: Secondary | ICD-10-CM | POA: Insufficient documentation

## 2020-01-16 LAB — ECHOCARDIOGRAM COMPLETE
Area-P 1/2: 1.81 cm2
Calc EF: 32.3 %
S' Lateral: 5.6 cm
Single Plane A2C EF: 35 %
Single Plane A4C EF: 28.4 %

## 2020-01-16 MED ORDER — EMPAGLIFLOZIN 10 MG PO TABS: 10.0000 mg | ORAL_TABLET | Freq: Every day | ORAL | 11 refills | Status: DC

## 2020-01-16 NOTE — Patient Instructions (Signed)
It was a pleasure seeing you today!  MEDICATIONS: -We are changing your medications today - Start taking Jardiance (empagliflozin) 10 mg daily. Do not fill [place].  -Call if you have questions about your medications.   NEXT APPOINTMENT: Return to clinic in 4 weeks with Dr. Aundra Dubin.  In general, to take care of your heart failure: -Limit your fluid intake to 2 Liters (half-gallon) per day.   -Limit your salt intake to ideally 2-3 grams (2000-3000 mg) per day. -Weigh yourself daily and record, and bring that "weight diary" to your next appointment.  (Weight gain of 2-3 pounds in 1 day typically means fluid weight.) -The medications for your heart are to help your heart and help you live longer.   -Please contact us before stopping any of your heart medications.  Call the clinic at [phone] with questions or to reschedule future appointments.

## 2020-01-16 NOTE — Progress Notes (Signed)
  Echocardiogram 2D Echocardiogram has been performed.  Michiel Cowboy 01/16/2020, 9:39 AM

## 2020-01-18 ENCOUNTER — Telehealth (HOSPITAL_COMMUNITY): Payer: Self-pay | Admitting: Pharmacist

## 2020-01-18 MED ORDER — EMPAGLIFLOZIN 25 MG PO TABS: ORAL_TABLET | ORAL | 3 refills | Status: AC

## 2020-01-18 NOTE — Telephone Encounter (Signed)
Received message from [facility] that empagliflozin had been approved for [facility]. However, they no longer carry the empagliflozin 10 mg strength. They requested new prescription for the 25 mg strength with instructions to take 12.5 (0.5 tablet) daily. New prescription was sent to [facility].   Audry Riles, PharmD, BCPS, BCCP, CPP Heart Failure Clinic Pharmacist [phone]

## 2020-01-30 DIAGNOSIS — I5022 Chronic systolic (congestive) heart failure: Secondary | ICD-10-CM | POA: Insufficient documentation
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
## 2020-02-01 ENCOUNTER — Encounter: Payer: Self-pay | Admitting: Internal Medicine

## 2020-02-01 ENCOUNTER — Ambulatory Visit (INDEPENDENT_AMBULATORY_CARE_PROVIDER_SITE_OTHER): Payer: No Typology Code available for payment source | Admitting: Internal Medicine

## 2020-02-01 ENCOUNTER — Other Ambulatory Visit: Payer: Self-pay

## 2020-02-01 VITALS — BP 92/68 | HR 74 | Ht 71.0 in | Wt 187.8 lb

## 2020-02-01 DIAGNOSIS — I5022 Chronic systolic (congestive) heart failure: Secondary | ICD-10-CM | POA: Diagnosis not present

## 2020-02-01 DIAGNOSIS — I442 Atrioventricular block, complete: Secondary | ICD-10-CM | POA: Diagnosis not present

## 2020-02-01 NOTE — Patient Instructions (Addendum)
Medication Instructions:  Your physician recommends that you continue on your current medications as directed. Please refer to the Current Medication list given to you today.  *If you need a refill on your cardiac medications before your next appointment, please call your pharmacy*   Lab Work: BMET and CBC today  If you have labs (blood work) drawn today and your tests are completely normal, you will receive your results only by: [name] MyChart Message (if you have MyChart) OR . A paper copy in the mail If you have any lab test that is abnormal or we need to change your treatment, we will call you to review the results.   Testing/Procedures: CRT upgrade to be scheduled and instructions to follow.   Follow-Up: At [facility], you and your health needs are our priority.  As part of our continuing mission to provide you with exceptional heart care, we have created designated Provider Care Teams.  These Care Teams include your primary Cardiologist (physician) and Advanced Practice Providers (APPs -  Physician Assistants and Nurse Practitioners) who all work together to provide you with the care you need, when you need it.  We recommend signing up for the patient portal called "MyChart".  Sign up information is provided on this After Visit Summary.  MyChart is used to connect with patients for Virtual Visits (Telemedicine).  Patients are able to view lab/test results, encounter notes, upcoming appointments, etc.  Non-urgent messages can be sent to your provider as well.   To learn more about what you can do with MyChart, go to [email or web address].    Your next appointment:   Follow up appointments will be scheduled by Dr Olin Pia scheduler

## 2020-02-02 LAB — CBC
Hematocrit: 41.6 % (ref 37.5–51.0)
Hemoglobin: 13.9 g/dL (ref 13.0–17.7)
MCH: 27.3 pg (ref 26.6–33.0)
MCHC: 33.4 g/dL (ref 31.5–35.7)
MCV: 82 fL (ref 79–97)
Platelets: 240 10*3/uL (ref 150–450)
RBC: 5.1 x10E6/uL (ref 4.14–5.80)
RDW: 14.3 % (ref 11.6–15.4)
WBC: 5.7 10*3/uL (ref 3.4–10.8)

## 2020-02-02 LAB — BASIC METABOLIC PANEL
BUN/Creatinine Ratio: 10 (ref 10–24)
BUN: 10 mg/dL (ref 8–27)
CO2: 23 mmol/L (ref 20–29)
Calcium: 9.9 mg/dL (ref 8.6–10.2)
Chloride: 105 mmol/L (ref 96–106)
Creatinine, Ser: 0.97 mg/dL (ref 0.76–1.27)
GFR calc Af Amer: 97 mL/min/{1.73_m2} (ref >59)
GFR calc non Af Amer: 84 mL/min/{1.73_m2} (ref >59)
Glucose: 107 mg/dL — ABNORMAL HIGH (ref 65–99)
Potassium: 4.5 mmol/L (ref 3.5–5.2)
Sodium: 141 mmol/L (ref 134–144)

## 2020-02-04 LAB — CUP PACEART INCLINIC DEVICE CHECK
Battery Remaining Longevity: 120 mo
Battery Voltage: 3.04 V
Brady Statistic AP VP Percent: 2.49 %
Brady Statistic AP VS Percent: 0 %
Brady Statistic AS VP Percent: 97.5 %
Brady Statistic AS VS Percent: 0.01 %
Brady Statistic RA Percent Paced: 2.47 %
Brady Statistic RV Percent Paced: 99.99 %
Date Time Interrogation Session: 20210924172957
Implantable Lead Implant Date: 20210222
Implantable Lead Implant Date: 20210222
Implantable Lead Location: 753859
Implantable Lead Location: 753860
Implantable Lead Model: 5076
Implantable Lead Model: 5076
Implantable Pulse Generator Implant Date: 20210222
Lead Channel Impedance Value: 342 Ohm
Lead Channel Impedance Value: 342 Ohm
Lead Channel Impedance Value: 399 Ohm
Lead Channel Impedance Value: 665 Ohm
Lead Channel Pacing Threshold Amplitude: 0.75 V
Lead Channel Pacing Threshold Amplitude: 0.875 V
Lead Channel Pacing Threshold Pulse Width: 0.4 ms
Lead Channel Pacing Threshold Pulse Width: 0.4 ms
Lead Channel Sensing Intrinsic Amplitude: 1.375 mV
Lead Channel Sensing Intrinsic Amplitude: 1.375 mV
Lead Channel Setting Pacing Amplitude: 2 V
Lead Channel Setting Pacing Amplitude: 2.5 V
Lead Channel Setting Pacing Pulse Width: 0.4 ms
Lead Channel Setting Sensing Sensitivity: 4 mV

## 2020-02-05 ENCOUNTER — Telehealth: Payer: Self-pay

## 2020-02-05 NOTE — Telephone Encounter (Signed)
Spoke with pt and reviewed device instruction letter.  See letter for complete details.  Pt verbalized understanding and agrees with current plan.  Letter mailed to pt at address in [place].

## 2020-02-08 ENCOUNTER — Telehealth: Payer: Self-pay

## 2020-02-08 ENCOUNTER — Telehealth (HOSPITAL_COMMUNITY): Payer: Self-pay | Admitting: Vascular Surgery

## 2020-02-08 NOTE — Telephone Encounter (Signed)
Attempted phone call to pt and left message to contact Rn at [phone].

## 2020-02-08 NOTE — Telephone Encounter (Signed)
-----   Message from Glenn Sprang, MD sent at 02/05/2020 10:17 PM EDT ----- Please Inform Patient that labs are normal  Thanks

## 2020-02-08 NOTE — Telephone Encounter (Signed)
2 nd attempt to contact pt to reschedule 10/8 appt , to next ava appt , [facility] will be out of office

## 2020-02-12 NOTE — Telephone Encounter (Signed)
Attempted phone call to pt and left message to contact RN at [phone]. 

## 2020-02-15 ENCOUNTER — Encounter (HOSPITAL_COMMUNITY): Payer: No Typology Code available for payment source | Admitting: Cardiology

## 2020-02-15 ENCOUNTER — Other Ambulatory Visit (HOSPITAL_COMMUNITY)
Admission: RE | Admit: 2020-02-15 | Discharge: 2020-02-15 | Disposition: A | Discharge status: Home / Self Care | Payer: No Typology Code available for payment source | Source: Ambulatory Visit | Attending: Internal Medicine | Admitting: Internal Medicine

## 2020-02-15 DIAGNOSIS — Z01812 Encounter for preprocedural laboratory examination: Secondary | ICD-10-CM | POA: Insufficient documentation

## 2020-02-15 DIAGNOSIS — Z20822 Contact with and (suspected) exposure to covid-19: Secondary | ICD-10-CM | POA: Insufficient documentation

## 2020-02-15 LAB — SARS CORONAVIRUS 2 (TAT 6-24 HRS): SARS Coronavirus 2: NEGATIVE

## 2020-02-15 NOTE — Progress Notes (Signed)
Attempted to call patient regarding procedure instructions for Monday.  Left voice mail, nothing to eat or drink after midnight, no medications morning of procedure, need responsible adult to drive your home as well as stay with you for 24 hours

## 2020-02-15 NOTE — Telephone Encounter (Signed)
Spoke with pt and advised per Dr Caryl Comes labs are normal.  Pt also notified of procedure time change.  Pt will need to report to [facility] hospital 02/18/2020 at 830am for 1030am procedure.

## 2020-02-18 ENCOUNTER — Ambulatory Visit (HOSPITAL_COMMUNITY): Payer: No Typology Code available for payment source

## 2020-02-18 ENCOUNTER — Ambulatory Visit (HOSPITAL_COMMUNITY)
Admission: RE | Admit: 2020-02-18 | Discharge: 2020-02-18 | Disposition: A | Discharge status: Home / Self Care | Payer: No Typology Code available for payment source | Attending: Internal Medicine | Admitting: Internal Medicine

## 2020-02-18 ENCOUNTER — Other Ambulatory Visit: Payer: Self-pay

## 2020-02-18 ENCOUNTER — Ambulatory Visit (HOSPITAL_COMMUNITY)
Admission: RE | Disposition: A | Discharge status: Home / Self Care | Payer: Self-pay | Source: Home / Self Care | Attending: Internal Medicine

## 2020-02-18 DIAGNOSIS — Z950 Presence of cardiac pacemaker: Secondary | ICD-10-CM | POA: Diagnosis not present

## 2020-02-18 DIAGNOSIS — I442 Atrioventricular block, complete: Secondary | ICD-10-CM | POA: Insufficient documentation

## 2020-02-18 DIAGNOSIS — C61 Malignant neoplasm of prostate: Secondary | ICD-10-CM | POA: Diagnosis not present

## 2020-02-18 DIAGNOSIS — R55 Syncope and collapse: Secondary | ICD-10-CM | POA: Insufficient documentation

## 2020-02-18 DIAGNOSIS — I10 Essential (primary) hypertension: Secondary | ICD-10-CM | POA: Diagnosis not present

## 2020-02-18 DIAGNOSIS — I2510 Atherosclerotic heart disease of native coronary artery without angina pectoris: Secondary | ICD-10-CM | POA: Diagnosis not present

## 2020-02-18 DIAGNOSIS — Z79899 Other long term (current) drug therapy: Secondary | ICD-10-CM | POA: Diagnosis not present

## 2020-02-18 DIAGNOSIS — Z959 Presence of cardiac and vascular implant and graft, unspecified: Secondary | ICD-10-CM

## 2020-02-18 DIAGNOSIS — I428 Other cardiomyopathies: Secondary | ICD-10-CM | POA: Diagnosis not present

## 2020-02-18 HISTORY — PX: BIV UPGRADE: EP1202

## 2020-02-18 SURGERY — BIV UPGRADE

## 2020-02-18 MED ORDER — SODIUM CHLORIDE 0.9 % IV SOLN
INTRAVENOUS | Status: AC
  Filled 2020-02-18: qty 2

## 2020-02-18 MED ORDER — ONDANSETRON HCL 4 MG/2ML IJ SOLN: 4.0000 mg | Freq: Four times a day (QID) | INTRAMUSCULAR | Status: DC | PRN

## 2020-02-18 MED ORDER — MIDAZOLAM HCL 5 MG/5ML IJ SOLN
INTRAMUSCULAR | Status: AC
  Filled 2020-02-18: qty 5

## 2020-02-18 MED ORDER — IOHEXOL 350 MG/ML SOLN
INTRAVENOUS | Status: DC | PRN
  Administered 2020-02-18: 15 mL
  Administered 2020-02-18: 20 mL
  Administered 2020-02-18: 10 mL

## 2020-02-18 MED ORDER — LIDOCAINE HCL 1 % IJ SOLN
INTRAMUSCULAR | Status: AC
  Filled 2020-02-18: qty 20

## 2020-02-18 MED ORDER — ACETAMINOPHEN 325 MG PO TABS
325.0000 mg | ORAL_TABLET | ORAL | Status: DC | PRN
  Administered 2020-02-18: 650 mg via ORAL
  Filled 2020-02-18 (×3): qty 2

## 2020-02-18 MED ORDER — MIDAZOLAM HCL 5 MG/5ML IJ SOLN
INTRAMUSCULAR | Status: DC | PRN
  Administered 2020-02-18 (×3): 1 mg via INTRAVENOUS
  Administered 2020-02-18: 2 mg via INTRAVENOUS
  Administered 2020-02-18 (×3): 1 mg via INTRAVENOUS

## 2020-02-18 MED ORDER — FENTANYL CITRATE (PF) 100 MCG/2ML IJ SOLN
INTRAMUSCULAR | Status: DC | PRN
Start: 2020-02-18 — End: 2020-02-18
  Administered 2020-02-18 (×2): 12.5 ug via INTRAVENOUS
  Administered 2020-02-18 (×4): 25 ug via INTRAVENOUS

## 2020-02-18 MED ORDER — SODIUM CHLORIDE 0.9 % IV SOLN
80.0000 mg | INTRAVENOUS | Status: AC
  Administered 2020-02-18: 80 mg
  Filled 2020-02-18: qty 2

## 2020-02-18 MED ORDER — CEFAZOLIN SODIUM-DEXTROSE 2-4 GM/100ML-% IV SOLN
2.0000 g | INTRAVENOUS | Status: AC
  Administered 2020-02-18: 2 g via INTRAVENOUS

## 2020-02-18 MED ORDER — CEFAZOLIN SODIUM-DEXTROSE 2-4 GM/100ML-% IV SOLN
INTRAVENOUS | Status: AC
  Filled 2020-02-18: qty 100

## 2020-02-18 MED ORDER — SODIUM CHLORIDE 0.9 % IV SOLN: INTRAVENOUS | Status: DC

## 2020-02-18 MED ORDER — LIDOCAINE HCL (PF) 1 % IJ SOLN
INTRAMUSCULAR | Status: DC | PRN
  Administered 2020-02-18: 60 mL

## 2020-02-18 MED ORDER — FENTANYL CITRATE (PF) 100 MCG/2ML IJ SOLN
INTRAMUSCULAR | Status: AC
  Filled 2020-02-18: qty 2

## 2020-02-18 MED ORDER — HEPARIN (PORCINE) IN NACL 1000-0.9 UT/500ML-% IV SOLN
INTRAVENOUS | Status: AC
  Filled 2020-02-18: qty 500

## 2020-02-18 MED ORDER — HEPARIN (PORCINE) IN NACL 1000-0.9 UT/500ML-% IV SOLN
INTRAVENOUS | Status: DC | PRN
  Administered 2020-02-18: 500 mL

## 2020-02-18 SURGICAL SUPPLY — 16 items
CABLE SURGICAL S-101-97-12 (CABLE) ×3 IMPLANT
CATH CPS DIRECT 135 DS2C020 (CATHETERS) ×3 IMPLANT
DEVICE CRTP PERCEPTA QUAD MRI (Pacemaker) ×3 IMPLANT
HEMOSTAT SURGICEL 2X4 FIBR (HEMOSTASIS) ×6 IMPLANT
KIT ESSENTIALS PG (KITS) ×3 IMPLANT
KIT MICROPUNCTURE NIT STIFF (SHEATH) ×6 IMPLANT
LEAD QUARTET 1456Q-86 (Lead) ×1 IMPLANT
PAD PRO RADIOLUCENT 2001M-C (PAD) ×3 IMPLANT
POUCH AIGIS-R ANTIBACT PPM (Mesh General) ×3 IMPLANT
QUARTET 1456Q-86 (Lead) ×3 IMPLANT
SHEATH 9.5FR PRELUDE SNAP 13 (SHEATH) ×3 IMPLANT
SHEATH PINNACLE 7F 10CM (SHEATH) ×3 IMPLANT
TRAY PACEMAKER INSERTION (PACKS) ×3 IMPLANT
WIRE ACUITY WHISPER EDS 4648 (WIRE) ×6 IMPLANT
WIRE AMPLATZ SS-J .035X180CM (WIRE) ×3 IMPLANT
WIRE HI TORQ VERSACORE-J 145CM (WIRE) ×6 IMPLANT

## 2020-02-18 NOTE — Interval H&P Note (Signed)
History and Physical Interval Note:  02/18/2020 10:08 AM  Glenn Brandt  has presented today for surgery, with the diagnosis of cardiomyopathy.  The various methods of treatment have been discussed with the patient and family. After consideration of risks, benefits and other options for treatment, the patient has consented to  Procedure(s): BIV UPGRADE (N/A) as a surgical intervention.  The patient's history has been reviewed, patient examined, no change in status, stable for surgery.  I have reviewed the patient's chart and labs.  Questions were answered to the patient's satisfaction.     Virl Axe  No changes Will plan pacer insertion and not ICD insertion as the presumed cause is pacemaker mediated cardiomyopathy and anticipate recovery

## 2020-02-18 NOTE — Progress Notes (Signed)
Dr Caryl Comes paged to return call

## 2020-02-18 NOTE — Progress Notes (Signed)
No return call Dr Lovena Le states ok to discharge pt.

## 2020-02-18 NOTE — Discharge Instructions (Signed)
After Your Lead Revision  . Do not lift your arm above shoulder height for 1 week after your procedure. After 7 days, you may progress as below.     Monday February 25, 2020  Tuesday February 26, 2020 Wednesday February 27, 2020 Thursday February 28, 2020   . Do not lift, push, pull, or carry anything over 10 pounds with the affected arm until 6 weeks (Monday March 31, 2020) after your procedure.   . Do not drive until your wound check or until instructed by your healthcare provider that you are safe to do so.   . Monitor your surgical site for redness, swelling, and drainage. Call the device clinic at [phone] if you experience these symptoms or fever/chills.  . If your incision is sealed with Steri-strips or staples. You may shower 7 days after your procedure and wash your incision with soap and water as long as it is healed. If your incision is closed with Dermabond/Surgical glue. You may shower 1 day after your pacemaker implant and wash your incision with soap and water. Avoid lotions, ointments, or perfumes over your incision until it is well-healed.  . You may use a hot tub or a pool AFTER your wound check appointment if the incision is completely closed.   . Your cardiac device may be MRI compatible. We will discuss this at your office visit/Wound check  . Remote monitoring is used to monitor your cardiac device from home. This monitoring is scheduled every 91 days by our office. It allows [place] to keep an eye on the functioning of your device to ensure it is working properly. You will routinely see your Electrophysiologist annually (more often if necessary).   Implantable Cardiac Device Lead Replacement, Care After This sheet gives you information about how to care for yourself after your procedure. Your health care provider may also give you more specific instructions. If you have problems or questions, contact your health care provider. What can I expect after the procedure? After  your procedure, it is common to have:  Mild discomfort at the incision site.  A small amount of drainage or bleeding at the incision site. This is usually no more than a spot. Follow these instructions at home: Incision care         Follow instructions from your health care provider about how to take care of your incision. Make sure you: ? Leave stitches (sutures), skin glue, or adhesive strips in place. These skin closures may need to stay in place for 2 weeks or longer. If adhesive strip edges start to loosen and curl up, you may trim the loose edges. Do not remove adhesive strips completely unless your health care provider tells you to do that.  Check your incision area every day for signs of infection. Check for: ? More redness, swelling, or pain. ? More fluid or blood. ? Warmth. ? Pus or a bad smell. Electric and [employer]ar[employer] cell phones should be kept 12 inches (30 cm) away from the cardiac device when they are on. When talking on a cell phone, use the ear on the opposite side of your cardiac device.  Do not place a cell phone in a pocket next to the cardiac device.  Household appliances do not interfere with modern-day cardiac device. Medicines  Take over-the-counter and prescription medicines only as told by your health care provider. General instructions  Do not raise the arm on the side of your procedure higher than your shoulder for  at least 7 days. Except for this restriction, continue to use your arm as normal to prevent problems.  Do not take baths, swim, or use a hot tub until your health care provider says it is okay to do so. You may shower as directed by your health care provider.  Do not lift anything that is heavier than 10 lb (4.5 kg) for 6 weeks or the limit that your health care provider tells you, until he or she says that it is safe.  Return to your normal activities after 2 weeks, or as told by your health care provider. Ask your health care  provider what activities are safe for you.  Keep all follow-up visits as told by your health care provider. This is important. Contact a health care provider if:  You have more redness, swelling, or pain around your incision.  You have more fluid or blood coming from your incision.  Your incision feels warm to the touch.  You have pus or a bad smell coming from your incision.  You have a fever.  The arm or hand on the side of the cardiac device becomes swollen.  The symptoms you had before your procedure are not getting better. Get help right away if:  You develop chest pain.  You feel like you will faint.  You feel light-headed.  You faint. Summary  Check your incision area every day for signs of infection, such as more fluid or blood. A small amount of drainage or bleeding at the incision site is normal.  Do not raise the arm on the side of your procedure higher than your shoulder for at least 5 days, or as long as directed by your health care provider.  Digital cell phones should be kept 12 inches (30 cm) away from the cardiac device when they are on. When talking on a cell phone, use the ear on the opposite side of your cardiac device.  If the symptoms that led to having your lead replaced are not getting better, contact your health care provider. This information is not intended to replace advice given to you by your health care provider. Make sure you discuss any questions you have with your health care provider.

## 2020-02-19 ENCOUNTER — Encounter (HOSPITAL_COMMUNITY): Payer: Self-pay | Admitting: Internal Medicine

## 2020-02-19 MED FILL — Lidocaine HCl Local Inj 1%: INTRAMUSCULAR | Qty: 60 | Status: AC

## 2020-02-28 ENCOUNTER — Other Ambulatory Visit: Payer: Self-pay

## 2020-02-28 ENCOUNTER — Telehealth (HOSPITAL_COMMUNITY): Payer: Self-pay | Admitting: Licensed Clinical Social Worker

## 2020-02-28 ENCOUNTER — Ambulatory Visit (INDEPENDENT_AMBULATORY_CARE_PROVIDER_SITE_OTHER): Payer: No Typology Code available for payment source | Admitting: Emergency Medicine

## 2020-02-28 DIAGNOSIS — I442 Atrioventricular block, complete: Secondary | ICD-10-CM

## 2020-02-28 LAB — CUP PACEART INCLINIC DEVICE CHECK
Battery Remaining Longevity: 98 mo
Battery Voltage: 3.09 V
Brady Statistic AP VP Percent: 0.87 %
Brady Statistic AP VS Percent: 0.01 %
Brady Statistic AS VP Percent: 98.59 %
Brady Statistic AS VS Percent: 0.52 %
Brady Statistic RA Percent Paced: 1.02 %
Brady Statistic RV Percent Paced: 99.46 %
Date Time Interrogation Session: 20211021165406
Implantable Lead Implant Date: 20210222
Implantable Lead Implant Date: 20210222
Implantable Lead Implant Date: 20211011
Implantable Lead Location: 753858
Implantable Lead Location: 753859
Implantable Lead Location: 753860
Implantable Lead Model: 5076
Implantable Lead Model: 5076
Implantable Pulse Generator Implant Date: 20211011
Lead Channel Impedance Value: 323 Ohm
Lead Channel Impedance Value: 323 Ohm
Lead Channel Impedance Value: 323 Ohm
Lead Channel Impedance Value: 323 Ohm
Lead Channel Impedance Value: 323 Ohm
Lead Channel Impedance Value: 323 Ohm
Lead Channel Impedance Value: 342 Ohm
Lead Channel Impedance Value: 342 Ohm
Lead Channel Impedance Value: 380 Ohm
Lead Channel Impedance Value: 380 Ohm
Lead Channel Impedance Value: 399 Ohm
Lead Channel Impedance Value: 399 Ohm
Lead Channel Impedance Value: 437 Ohm
Lead Channel Impedance Value: 437 Ohm
Lead Channel Impedance Value: 513 Ohm
Lead Channel Impedance Value: 513 Ohm
Lead Channel Impedance Value: 608 Ohm
Lead Channel Impedance Value: 608 Ohm
Lead Channel Impedance Value: 627 Ohm
Lead Channel Impedance Value: 627 Ohm
Lead Channel Impedance Value: 646 Ohm
Lead Channel Impedance Value: 646 Ohm
Lead Channel Impedance Value: 684 Ohm
Lead Channel Impedance Value: 684 Ohm
Lead Channel Impedance Value: 703 Ohm
Lead Channel Impedance Value: 703 Ohm
Lead Channel Impedance Value: 760 Ohm
Lead Channel Impedance Value: 760 Ohm
Lead Channel Pacing Threshold Amplitude: 0.75 V
Lead Channel Pacing Threshold Amplitude: 1 V
Lead Channel Pacing Threshold Amplitude: 1.75 V
Lead Channel Pacing Threshold Pulse Width: 0.4 ms
Lead Channel Pacing Threshold Pulse Width: 0.4 ms
Lead Channel Pacing Threshold Pulse Width: 0.4 ms
Lead Channel Sensing Intrinsic Amplitude: 1.125 mV
Lead Channel Sensing Intrinsic Amplitude: 1.125 mV
Lead Channel Sensing Intrinsic Amplitude: 1.375 mV
Lead Channel Sensing Intrinsic Amplitude: 1.375 mV
Lead Channel Setting Pacing Amplitude: 1.75 V
Lead Channel Setting Pacing Amplitude: 2 V
Lead Channel Setting Pacing Amplitude: 3 V
Lead Channel Setting Pacing Pulse Width: 0.4 ms
Lead Channel Setting Pacing Pulse Width: 0.4 ms
Lead Channel Setting Sensing Sensitivity: 0.9 mV

## 2020-02-28 NOTE — Telephone Encounter (Signed)
CSW team was contacted by pt who was concerned by a cough he has developed. Pt PCP is [name], [place], at the [facility]. He has been scheduled for a pulmonology appointment in the [facility] specialty clinic on November 23rd. He is inquiring if he should be seen in the Heart Failure clinic. Pt notes no weight changes at this time.   CSW team spoke with Triage who recommended pt get a COVID test and reach out to PCP at [place]. If pt notices change in weight or swelling he can call back to clinic to be seen.    CSW called [facility] and spoke with RN at PCP office. She confirmed pt has an appointment on November 23rd at [place], she explained pt may be able to be seen in the community if approved by Care in the [place]. She encouraged pt to f/u with his PCP or go to [facility] Department if concerns became emergent. She states she will call pt.   CSW team also f/u with pt via telephone at [phone]. Introduced self and role. Explained the above recommendations to pt from triage and [name]. He confirms PCP office called him and is in the process of looking for a pulmonology appointment to see him sooner. He is aware that should he notice weight changes or swelling he can call the clinic. All questions answered at this time.   Glenn Brandt, MSW, [facility] [place] [facility]

## 2020-02-28 NOTE — Patient Instructions (Signed)
Obtain Chest X-ray at [facility] next week.   Return for recheck of device 03/18/20.

## 2020-02-28 NOTE — Progress Notes (Signed)
Wound check appointment. Dermabond removed. Wound without redness or edema. Incision edges approximated, wound well healed. Normal device function. RA sensing and RV threshold consistent with implant measurements.  LV threshold and impendences noted to be elevated, Vector express testing shows threshold in range for only Lv1 to can vector.  With assistance from Dr. Quentin Ore, reprogrammed device LV1 to can with adaptive output of 3.0V@ 0.94ms.  Patient to have CXR next week and come back for recheck of LV lead in 2.5 weeks.  RA and RV lead outputs are programmed appropriately for chronic leads within appropriate safety margin.   Histogram distribution appropriate for patient and level of activity. No mode switches or high ventricular rates noted. Patient educated about wound care, arm mobility, lifting restrictions.   ROV on 11/9 for in-clinic testing of LV lead.  ROV with Dr. Caryl Comes 05/26/20.

## 2020-03-03 ENCOUNTER — Emergency Department (HOSPITAL_COMMUNITY): Payer: No Typology Code available for payment source

## 2020-03-03 ENCOUNTER — Emergency Department (HOSPITAL_COMMUNITY)
Admission: EM | Admit: 2020-03-03 | Discharge: 2020-03-03 | Disposition: A | Discharge status: Home / Self Care | Payer: No Typology Code available for payment source | Source: Home / Self Care | Attending: Emergency Medicine | Admitting: Emergency Medicine

## 2020-03-03 ENCOUNTER — Other Ambulatory Visit: Payer: Self-pay

## 2020-03-03 ENCOUNTER — Encounter (HOSPITAL_COMMUNITY): Payer: Self-pay | Admitting: Emergency Medicine

## 2020-03-03 DIAGNOSIS — I110 Hypertensive heart disease with heart failure: Secondary | ICD-10-CM | POA: Insufficient documentation

## 2020-03-03 DIAGNOSIS — R079 Chest pain, unspecified: Secondary | ICD-10-CM | POA: Insufficient documentation

## 2020-03-03 DIAGNOSIS — Z20822 Contact with and (suspected) exposure to covid-19: Secondary | ICD-10-CM | POA: Insufficient documentation

## 2020-03-03 DIAGNOSIS — I5022 Chronic systolic (congestive) heart failure: Secondary | ICD-10-CM | POA: Insufficient documentation

## 2020-03-03 DIAGNOSIS — R0602 Shortness of breath: Secondary | ICD-10-CM | POA: Insufficient documentation

## 2020-03-03 DIAGNOSIS — J449 Chronic obstructive pulmonary disease, unspecified: Secondary | ICD-10-CM | POA: Insufficient documentation

## 2020-03-03 DIAGNOSIS — Z8546 Personal history of malignant neoplasm of prostate: Secondary | ICD-10-CM | POA: Insufficient documentation

## 2020-03-03 DIAGNOSIS — J441 Chronic obstructive pulmonary disease with (acute) exacerbation: Secondary | ICD-10-CM | POA: Diagnosis not present

## 2020-03-03 DIAGNOSIS — I2510 Atherosclerotic heart disease of native coronary artery without angina pectoris: Secondary | ICD-10-CM | POA: Insufficient documentation

## 2020-03-03 DIAGNOSIS — Z87891 Personal history of nicotine dependence: Secondary | ICD-10-CM | POA: Insufficient documentation

## 2020-03-03 DIAGNOSIS — Z79899 Other long term (current) drug therapy: Secondary | ICD-10-CM | POA: Insufficient documentation

## 2020-03-03 DIAGNOSIS — R059 Cough, unspecified: Secondary | ICD-10-CM

## 2020-03-03 LAB — BASIC METABOLIC PANEL
Anion gap: 9 (ref 5–15)
BUN: 8 mg/dL (ref 8–23)
CO2: 25 mmol/L (ref 22–32)
Calcium: 9.4 mg/dL (ref 8.9–10.3)
Chloride: 105 mmol/L (ref 98–111)
Creatinine, Ser: 1.1 mg/dL (ref 0.61–1.24)
GFR, Estimated: >60 mL/min (ref >60)
Glucose, Bld: 162 mg/dL — ABNORMAL HIGH (ref 70–99)
Potassium: 3.9 mmol/L (ref 3.5–5.1)
Sodium: 139 mmol/L (ref 135–145)

## 2020-03-03 LAB — D-DIMER, QUANTITATIVE: D-Dimer, Quant: 3.04 ug/mL-FEU — ABNORMAL HIGH (ref 0.00–0.50)

## 2020-03-03 LAB — CBC
HCT: 40.6 % (ref 39.0–52.0)
Hemoglobin: 13.1 g/dL (ref 13.0–17.0)
MCH: 27.7 pg (ref 26.0–34.0)
MCHC: 32.3 g/dL (ref 30.0–36.0)
MCV: 85.8 fL (ref 80.0–100.0)
Platelets: 266 10*3/uL (ref 150–400)
RBC: 4.73 MIL/uL (ref 4.22–5.81)
RDW: 14.4 % (ref 11.5–15.5)
WBC: 6.7 10*3/uL (ref 4.0–10.5)
nRBC: 0 % (ref 0.0–0.2)

## 2020-03-03 LAB — RESPIRATORY PANEL BY RT PCR (FLU A&B, COVID)
Influenza A by PCR: NEGATIVE
Influenza B by PCR: NEGATIVE
SARS Coronavirus 2 by RT PCR: NEGATIVE

## 2020-03-03 LAB — TROPONIN I (HIGH SENSITIVITY)
Troponin I (High Sensitivity): 8 ng/L (ref <18)
Troponin I (High Sensitivity): 8 ng/L (ref <18)

## 2020-03-03 LAB — BRAIN NATRIURETIC PEPTIDE: B Natriuretic Peptide: 182.9 pg/mL — ABNORMAL HIGH (ref 0.0–100.0)

## 2020-03-03 MED ORDER — BENZONATATE 100 MG PO CAPS: 100.0000 mg | ORAL_CAPSULE | Freq: Three times a day (TID) | ORAL | 0 refills | Status: DC | PRN

## 2020-03-03 MED ORDER — ALBUTEROL SULFATE HFA 108 (90 BASE) MCG/ACT IN AERS
4.0000 | INHALATION_SPRAY | Freq: Once | RESPIRATORY_TRACT | Status: AC
  Administered 2020-03-03: 4 via RESPIRATORY_TRACT
  Filled 2020-03-03: qty 6.7

## 2020-03-03 MED ORDER — IOHEXOL 350 MG/ML SOLN
75.0000 mL | Freq: Once | INTRAVENOUS | Status: AC | PRN
  Administered 2020-03-03: 75 mL via INTRAVENOUS

## 2020-03-03 NOTE — ED Triage Notes (Signed)
Pt here from home with c/o sob over the last few days pt recently had a new wire placed on his pacemaker by Caryl Comes , and started feeling dizzy and sob on Friday
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
## 2020-03-03 NOTE — Discharge Instructions (Signed)
If you develop recurrent, continued, or worsening chest pain, shortness of breath, fever, vomiting, abdominal or back pain, or any other new/concerning symptoms then return to the ER for evaluation.  

## 2020-03-05 ENCOUNTER — Other Ambulatory Visit: Payer: Self-pay

## 2020-03-05 ENCOUNTER — Emergency Department (HOSPITAL_COMMUNITY): Payer: No Typology Code available for payment source

## 2020-03-05 ENCOUNTER — Encounter (HOSPITAL_COMMUNITY): Payer: Self-pay | Admitting: Internal Medicine

## 2020-03-05 ENCOUNTER — Inpatient Hospital Stay (HOSPITAL_COMMUNITY)
Admission: EM | Admit: 2020-03-05 | Discharge: 2020-03-08 | DRG: 191 | Disposition: A | Discharge status: Home / Self Care | Payer: No Typology Code available for payment source | Attending: Internal Medicine | Admitting: Internal Medicine

## 2020-03-05 DIAGNOSIS — Z7951 Long term (current) use of inhaled steroids: Secondary | ICD-10-CM

## 2020-03-05 DIAGNOSIS — J441 Chronic obstructive pulmonary disease with (acute) exacerbation: Principal | ICD-10-CM | POA: Diagnosis present

## 2020-03-05 DIAGNOSIS — I5022 Chronic systolic (congestive) heart failure: Secondary | ICD-10-CM | POA: Diagnosis present

## 2020-03-05 DIAGNOSIS — R55 Syncope and collapse: Secondary | ICD-10-CM | POA: Diagnosis present

## 2020-03-05 DIAGNOSIS — Z79899 Other long term (current) drug therapy: Secondary | ICD-10-CM

## 2020-03-05 DIAGNOSIS — R053 Chronic cough: Secondary | ICD-10-CM | POA: Diagnosis present

## 2020-03-05 DIAGNOSIS — Z87891 Personal history of nicotine dependence: Secondary | ICD-10-CM

## 2020-03-05 DIAGNOSIS — E785 Hyperlipidemia, unspecified: Secondary | ICD-10-CM | POA: Diagnosis present

## 2020-03-05 DIAGNOSIS — G8929 Other chronic pain: Secondary | ICD-10-CM | POA: Diagnosis present

## 2020-03-05 DIAGNOSIS — I10 Essential (primary) hypertension: Secondary | ICD-10-CM | POA: Diagnosis not present

## 2020-03-05 DIAGNOSIS — M545 Low back pain, unspecified: Secondary | ICD-10-CM | POA: Diagnosis not present

## 2020-03-05 DIAGNOSIS — D649 Anemia, unspecified: Secondary | ICD-10-CM | POA: Diagnosis present

## 2020-03-05 DIAGNOSIS — Z950 Presence of cardiac pacemaker: Secondary | ICD-10-CM

## 2020-03-05 DIAGNOSIS — F4310 Post-traumatic stress disorder, unspecified: Secondary | ICD-10-CM | POA: Diagnosis present

## 2020-03-05 DIAGNOSIS — I110 Hypertensive heart disease with heart failure: Secondary | ICD-10-CM | POA: Diagnosis present

## 2020-03-05 DIAGNOSIS — N400 Enlarged prostate without lower urinary tract symptoms: Secondary | ICD-10-CM | POA: Diagnosis present

## 2020-03-05 DIAGNOSIS — Z8546 Personal history of malignant neoplasm of prostate: Secondary | ICD-10-CM

## 2020-03-05 DIAGNOSIS — Z20822 Contact with and (suspected) exposure to covid-19: Secondary | ICD-10-CM | POA: Diagnosis present

## 2020-03-05 DIAGNOSIS — I2510 Atherosclerotic heart disease of native coronary artery without angina pectoris: Secondary | ICD-10-CM | POA: Diagnosis not present

## 2020-03-05 DIAGNOSIS — E7800 Pure hypercholesterolemia, unspecified: Secondary | ICD-10-CM | POA: Diagnosis present

## 2020-03-05 LAB — RESP PANEL BY RT PCR (RSV, FLU A&B, COVID)
Influenza A by PCR: NEGATIVE
Influenza B by PCR: NEGATIVE
Respiratory Syncytial Virus by PCR: NEGATIVE
SARS Coronavirus 2 by RT PCR: NEGATIVE

## 2020-03-05 LAB — CBC WITH DIFFERENTIAL/PLATELET
Abs Immature Granulocytes: 0.02 10*3/uL (ref 0.00–0.07)
Basophils Absolute: 0 10*3/uL (ref 0.0–0.1)
Basophils Relative: 1 %
Eosinophils Absolute: 0.7 10*3/uL — ABNORMAL HIGH (ref 0.0–0.5)
Eosinophils Relative: 10 %
HCT: 40.1 % (ref 39.0–52.0)
Hemoglobin: 12.8 g/dL — ABNORMAL LOW (ref 13.0–17.0)
Immature Granulocytes: 0 %
Lymphocytes Relative: 16 %
Lymphs Abs: 1 10*3/uL (ref 0.7–4.0)
MCH: 27.6 pg (ref 26.0–34.0)
MCHC: 31.9 g/dL (ref 30.0–36.0)
MCV: 86.6 fL (ref 80.0–100.0)
Monocytes Absolute: 0.7 10*3/uL (ref 0.1–1.0)
Monocytes Relative: 10 %
Neutro Abs: 4.2 10*3/uL (ref 1.7–7.7)
Neutrophils Relative %: 63 %
Platelets: 291 10*3/uL (ref 150–400)
RBC: 4.63 MIL/uL (ref 4.22–5.81)
RDW: 14.6 % (ref 11.5–15.5)
WBC: 6.7 10*3/uL (ref 4.0–10.5)
nRBC: 0 % (ref 0.0–0.2)

## 2020-03-05 LAB — I-STAT CHEM 8, ED
BUN: 8 mg/dL (ref 8–23)
Calcium, Ion: 1.21 mmol/L (ref 1.15–1.40)
Chloride: 104 mmol/L (ref 98–111)
Creatinine, Ser: 1 mg/dL (ref 0.61–1.24)
Glucose, Bld: 112 mg/dL — ABNORMAL HIGH (ref 70–99)
HCT: 38 % — ABNORMAL LOW (ref 39.0–52.0)
Hemoglobin: 12.9 g/dL — ABNORMAL LOW (ref 13.0–17.0)
Potassium: 4 mmol/L (ref 3.5–5.1)
Sodium: 140 mmol/L (ref 135–145)
TCO2: 28 mmol/L (ref 22–32)

## 2020-03-05 LAB — HIV ANTIBODY (ROUTINE TESTING W REFLEX): HIV Screen 4th Generation wRfx: NONREACTIVE

## 2020-03-05 MED ORDER — PREDNISONE 20 MG PO TABS
40.0000 mg | ORAL_TABLET | Freq: Every day | ORAL | Status: DC
  Administered 2020-03-06 – 2020-03-08 (×3): 40 mg via ORAL
  Filled 2020-03-05 (×3): qty 2

## 2020-03-05 MED ORDER — IPRATROPIUM BROMIDE 0.02 % IN SOLN
0.5000 mg | Freq: Once | RESPIRATORY_TRACT | Status: AC
  Administered 2020-03-05: 0.5 mg via RESPIRATORY_TRACT

## 2020-03-05 MED ORDER — IPRATROPIUM BROMIDE 0.02 % IN SOLN
0.5000 mg | Freq: Once | RESPIRATORY_TRACT | Status: AC
  Administered 2020-03-05: 0.5 mg via RESPIRATORY_TRACT
  Filled 2020-03-05: qty 2.5

## 2020-03-05 MED ORDER — SACUBITRIL-VALSARTAN 49-51 MG PO TABS
0.5000 | ORAL_TABLET | Freq: Two times a day (BID) | ORAL | Status: DC
  Administered 2020-03-05 – 2020-03-08 (×6): 0.5 via ORAL
  Filled 2020-03-05 (×7): qty 0.5

## 2020-03-05 MED ORDER — GUAIFENESIN-CODEINE 100-10 MG/5ML PO SOLN
10.0000 mL | Freq: Two times a day (BID) | ORAL | Status: DC | PRN
  Administered 2020-03-05: 10 mL via ORAL
  Filled 2020-03-05: qty 10

## 2020-03-05 MED ORDER — TAMSULOSIN HCL 0.4 MG PO CAPS
0.4000 mg | ORAL_CAPSULE | Freq: Every day | ORAL | Status: DC
  Administered 2020-03-05 – 2020-03-07 (×3): 0.4 mg via ORAL
  Filled 2020-03-05 (×3): qty 1

## 2020-03-05 MED ORDER — METHYLPREDNISOLONE SODIUM SUCC 125 MG IJ SOLR
125.0000 mg | Freq: Once | INTRAMUSCULAR | Status: AC
  Administered 2020-03-05: 125 mg via INTRAVENOUS
  Filled 2020-03-05: qty 2

## 2020-03-05 MED ORDER — ENOXAPARIN SODIUM 40 MG/0.4ML ~~LOC~~ SOLN
40.0000 mg | SUBCUTANEOUS | Status: DC
  Administered 2020-03-05 – 2020-03-07 (×3): 40 mg via SUBCUTANEOUS
  Filled 2020-03-05 (×4): qty 0.4

## 2020-03-05 MED ORDER — MAGNESIUM SULFATE 2 GM/50ML IV SOLN: 2.0000 g | Freq: Once | INTRAVENOUS | Status: DC

## 2020-03-05 MED ORDER — SPIRONOLACTONE 25 MG PO TABS
25.0000 mg | ORAL_TABLET | Freq: Every day | ORAL | Status: DC
  Administered 2020-03-06 – 2020-03-08 (×3): 25 mg via ORAL
  Filled 2020-03-05 (×4): qty 1

## 2020-03-05 MED ORDER — IPRATROPIUM-ALBUTEROL 0.5-2.5 (3) MG/3ML IN SOLN
3.0000 mL | Freq: Four times a day (QID) | RESPIRATORY_TRACT | Status: DC
  Administered 2020-03-05 (×2): 3 mL via RESPIRATORY_TRACT
  Filled 2020-03-05 (×2): qty 3

## 2020-03-05 MED ORDER — ADULT MULTIVITAMIN W/MINERALS CH
1.0000 | ORAL_TABLET | Freq: Every day | ORAL | Status: DC
  Administered 2020-03-05 – 2020-03-08 (×4): 1 via ORAL
  Filled 2020-03-05 (×4): qty 1

## 2020-03-05 MED ORDER — ALBUTEROL SULFATE (2.5 MG/3ML) 0.083% IN NEBU
5.0000 mg | INHALATION_SOLUTION | Freq: Once | RESPIRATORY_TRACT | Status: AC
  Administered 2020-03-05: 5 mg via RESPIRATORY_TRACT
  Filled 2020-03-05: qty 6

## 2020-03-05 MED ORDER — EMPAGLIFLOZIN 25 MG PO TABS
25.0000 mg | ORAL_TABLET | Freq: Every day | ORAL | Status: DC
  Administered 2020-03-05 – 2020-03-08 (×4): 25 mg via ORAL
  Filled 2020-03-05 (×5): qty 1

## 2020-03-05 MED ORDER — ALBUTEROL (5 MG/ML) CONTINUOUS INHALATION SOLN
10.0000 mg/h | INHALATION_SOLUTION | Freq: Once | RESPIRATORY_TRACT | Status: AC
  Administered 2020-03-05: 10 mg/h via RESPIRATORY_TRACT
  Filled 2020-03-05: qty 20

## 2020-03-05 MED ORDER — ALBUTEROL SULFATE HFA 108 (90 BASE) MCG/ACT IN AERS
2.0000 | INHALATION_SPRAY | Freq: Four times a day (QID) | RESPIRATORY_TRACT | Status: DC | PRN
  Administered 2020-03-05 – 2020-03-06 (×2): 2 via RESPIRATORY_TRACT
  Filled 2020-03-05: qty 6.7

## 2020-03-05 MED ORDER — AZITHROMYCIN 250 MG PO TABS
250.0000 mg | ORAL_TABLET | Freq: Every day | ORAL | Status: DC
  Administered 2020-03-06 – 2020-03-08 (×3): 250 mg via ORAL
  Filled 2020-03-05 (×3): qty 1

## 2020-03-05 MED ORDER — AZITHROMYCIN 250 MG PO TABS
500.0000 mg | ORAL_TABLET | Freq: Every day | ORAL | Status: AC
  Administered 2020-03-05: 500 mg via ORAL
  Filled 2020-03-05: qty 2

## 2020-03-05 MED ORDER — THIAMINE HCL 100 MG PO TABS
100.0000 mg | ORAL_TABLET | Freq: Every day | ORAL | Status: DC
  Administered 2020-03-05 – 2020-03-08 (×4): 100 mg via ORAL
  Filled 2020-03-05 (×6): qty 1

## 2020-03-05 MED ORDER — PANTOPRAZOLE SODIUM 40 MG PO TBEC
40.0000 mg | DELAYED_RELEASE_TABLET | Freq: Every day | ORAL | Status: DC
  Administered 2020-03-05 – 2020-03-08 (×4): 40 mg via ORAL
  Filled 2020-03-05 (×4): qty 1

## 2020-03-05 MED ORDER — THIAMINE HCL 100 MG/ML IJ SOLN
100.0000 mg | Freq: Every day | INTRAMUSCULAR | Status: DC
  Filled 2020-03-05: qty 2

## 2020-03-05 MED ORDER — BISOPROLOL FUMARATE 5 MG PO TABS
5.0000 mg | ORAL_TABLET | Freq: Every day | ORAL | Status: DC
  Administered 2020-03-05 – 2020-03-08 (×4): 5 mg via ORAL
  Filled 2020-03-05 (×5): qty 1

## 2020-03-05 MED ORDER — DICLOFENAC SODIUM 1 % EX GEL
4.0000 g | Freq: Four times a day (QID) | CUTANEOUS | Status: DC | PRN
  Administered 2020-03-06: 4 g via TOPICAL
  Filled 2020-03-05: qty 100

## 2020-03-05 MED ORDER — HYDROCODONE-ACETAMINOPHEN 5-325 MG PO TABS
1.0000 | ORAL_TABLET | Freq: Two times a day (BID) | ORAL | Status: DC | PRN
  Administered 2020-03-05 – 2020-03-08 (×2): 1 via ORAL
  Filled 2020-03-05 (×2): qty 1

## 2020-03-05 MED ORDER — FOLIC ACID 1 MG PO TABS
1.0000 mg | ORAL_TABLET | Freq: Every day | ORAL | Status: DC
  Administered 2020-03-05 – 2020-03-08 (×4): 1 mg via ORAL
  Filled 2020-03-05 (×4): qty 1

## 2020-03-05 MED ORDER — SIMVASTATIN 20 MG PO TABS
10.0000 mg | ORAL_TABLET | Freq: Every day | ORAL | Status: DC
  Administered 2020-03-05 – 2020-03-07 (×3): 10 mg via ORAL
  Filled 2020-03-05 (×3): qty 1

## 2020-03-05 NOTE — ED Provider Notes (Signed)
Patient seen/examined in the Emergency Department in conjunction with Advanced Practice Provider [facility] Patient reports cough and wheezing Exam : awake/alert, wheezing bilaterally, tachypneic Plan: plan to treat wheeze with nebs and reassess CXR negative    Ripley Fraise, MD 03/05/20 [phone]

## 2020-03-05 NOTE — ED Provider Notes (Signed)
Care assumed from K. Humes PA-C at shift change pending duoneb and reassessment.  See her note for full H&P.   Briefly this is a 59 yo male w/ history of COPD presenting to ED via EMS with cough, wheezing, SOB that has progressively worsened x 2 days. Seen 10/25 with similar symptoms had work up including elevated dimer and CTA neg for PE.   He arrived via EMS and received 3 breathing treatments and IV mag in route. In ED he has received 1 duo neb and had negative chest xray. After 1 duoneb he continues to have significant wheezing and accessory muscle use.    Physical Exam  BP 126/86   Pulse (!) 103   Temp 98.1 F (36.7 C) (Oral)   Resp 17   Ht 5\' 11"  (1.803 m)   Wt 85.3 kg   SpO2 97%   BMI 26.22 kg/m   Physical Exam Vitals and nursing note reviewed.  Constitutional:      Appearance: He is well-developed. He is not ill-appearing or toxic-appearing.  HENT:     Head: Normocephalic and atraumatic.     Nose: Nose normal.  Eyes:     General: No scleral icterus.       Right eye: No discharge.        Left eye: No discharge.     Conjunctiva/sclera: Conjunctivae normal.  Neck:     Vascular: No JVD.  Cardiovascular:     Rate and Rhythm: Normal rate and regular rhythm.     Pulses: Normal pulses.     Heart sounds: Normal heart sounds.  Pulmonary:     Breath sounds: Wheezing present.     Comments: Tachypneic. Talking in short sentences. Accessory muscle use. Hacking cough Abdominal:     General: There is no distension.  Musculoskeletal:        General: Normal range of motion.     Cervical back: Normal range of motion.  Skin:    General: Skin is warm and dry.  Neurological:     Mental Status: He is oriented to person, place, and time.     GCS: GCS eye subscore is 4. GCS verbal subscore is 5. GCS motor subscore is 6.     Comments: Fluent speech, no facial droop.  Psychiatric:        Behavior: Behavior normal.     ED Course/Procedures   Clinical Course as of Mar 05 649   Wed Mar 05, 2020  0430 Patient presenting for cough, SOB. Seen on 03/03/20 for same. Had labs, troponin x 2. Dimer was elevated, so had a CTA chest which was negative for PE. Also no evidence of PNA. Has COPD hx with wheezing on exam. No respiratory distress. Will give steroids, Duoneb.   [KH]  0610 Wheezing has persisted, but improved slightly. Will repeat DuoNeb.   [KH]    Clinical Course User Index [KH] Antonietta Breach, PA-C    Procedures Results for orders placed or performed during the hospital encounter of 03/05/20  CBC with Differential  Result Value Ref Range   WBC 6.7 4.0 - 10.5 K/uL   RBC 4.63 4.22 - 5.81 MIL/uL   Hemoglobin 12.8 (L) 13.0 - 17.0 g/dL   HCT 40.1 39 - 52 %   MCV 86.6 80.0 - 100.0 fL   MCH 27.6 26.0 - 34.0 pg   MCHC 31.9 30.0 - 36.0 g/dL   RDW 14.6 11.5 - 15.5 %   Platelets 291 150 - 400 K/uL   nRBC  0.0 0.0 - 0.2 %   Neutrophils Relative % 63 %   Neutro Abs 4.2 1.7 - 7.7 K/uL   Lymphocytes Relative 16 %   Lymphs Abs 1.0 0.7 - 4.0 K/uL   Monocytes Relative 10 %   Monocytes Absolute 0.7 0.1 - 1.0 K/uL   Eosinophils Relative 10 %   Eosinophils Absolute 0.7 (H) 0.0 - 0.5 K/uL   Basophils Relative 1 %   Basophils Absolute 0.0 0.0 - 0.1 K/uL   Immature Granulocytes 0 %   Abs Immature Granulocytes 0.02 0.00 - 0.07 K/uL  I-stat chem 8, ED (not at [facility] or [facility])  Result Value Ref Range   Sodium 140 135 - 145 mmol/L   Potassium 4.0 3.5 - 5.1 mmol/L   Chloride 104 98 - 111 mmol/L   BUN 8 8 - 23 mg/dL   Creatinine, Ser 1.00 0.61 - 1.24 mg/dL   Glucose, Bld 112 (H) 70 - 99 mg/dL   Calcium, Ion 1.21 1.15 - 1.40 mmol/L   TCO2 28 22 - 32 mmol/L   Hemoglobin 12.9 (L) 13.0 - 17.0 g/dL   HCT 38.0 (L) 39 - 52 %    EXAM:  CHEST - 2 VIEW    COMPARISON: Two days ago    FINDINGS:  Biventricular pacer from the left in unremarkable position. There is  extensive artifact from EKG leads. There is no edema, consolidation,  effusion, or pneumothorax.     IMPRESSION:  No evidence of active disease.      Electronically Signed  By: Monte Fantasia M.D.  On: 03/05/2020 04:50    MDM  Patient received in signout.  Please see previous provider note to include MDM up to this point.  Given second DuoNeb.  On reassessment he still having significant wheezing and accessory muscle use.  Patient is not back to baseline.  He already received IV mag and Solu-Medrol.  Will start continuous neb.  Patient will likely require admission for basic labs checked and are overall unremarkable.  Patient started on continuous neb.  Will be unassigned admission.  Findings and plan of care discussed with supervising physician Dr. Tyrone Nine who agrees with plan to admit. Spoke with IM service who agrees to assume care of patient and bring into the hospital for further evaluation and management.     Portions of this note were generated with [employer]. Dictation errors may occur despite best attempts at proofreading.    Cherre Robins, PA-C 03/05/20 [place], [place], DO 03/05/20 1208

## 2020-03-05 NOTE — ED Notes (Signed)
Per PA, ok to give pt food and drink. Provided pt w/ [place] sandwich and water.
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
## 2020-03-05 NOTE — ED Notes (Signed)
Attempted to call report to 2W. Per Furniture conservator/restorer of 2W, the room isn't ready even though it says bed ready. Offered to give report now and have them call me when the room is ready and they refused. Notified ED Charge RN. Will call back in 10 mins.

## 2020-03-05 NOTE — H&P (Signed)
Date: 03/05/2020               Patient Name:  Glenn Brandt MRN: [id number]  DOB: March 07, 1961 Age / Sex: 59 y.o., male   PCP: [place] Service: Internal Medicine Teaching Service         Attending Physician: Dr. Heber Bandera, Rachel Moulds, DO    First Contact: Dr. Shon Baton Pager: [phone]  Second Contact: Dr. Laural Golden Pager: [phone]       After Hours (After 5p/  First Contact Pager: [phone]  weekends / holidays): Second Contact Pager: [phone]   Chief Complaint: Cough and presyncopal episode  History of Present Illness: 59 year old male with COPD?, HTN, HLD, CAD, systolic HF, CHB s/p AV pacemaker placement, prostate cancer,  presented with cough and presyncopal episode. He was seen in emergency room 2 days ago for similar issue where work up including trop, CTA and covid were negative. He reports that he has had episodes of cough sometimes productive since June. He denies SOB unless he cough a lot. His cough spells are that bad that he feels lightheaded with that and he feels he is going to pass out. He had this symptoms before and was told that he has COPD but has not done the PFT yet. He used his nebulizer without significant improvement. He believes that his cough spell initially resolved after he had the first implant placed but came back again after the second implant placed about 2 weeks ago.He denies reflux and mentions that being on Omeprazol did not help with that. No associated chest pain unless after continuous No palpitation or irregular rhythm. No LEE. No DOE. He denies any fever, chills.  No nausea, vomiting or diarrhea.  Patient is vaccinated for Covid.  Denies any sick contact.  On EMS arrival, he was slightly tachycardic and tachypneic but saturating well on room air.  Per ED provider, EMS provided IV magnesium sulfate, breathing treatment with nebulizer and patient was transferred to ED. ED course: pt was was awake and alert, slightly tachycardic at  103-118, tachypneic (RR18-25) and had some wheezing. He was not in respiratory distress and was saturating 96% in room air. He received albuterol and Atrovent nebulizer, and Solu-Medrol 125 mg IV.  He showed some improvement with above treatment but not on his baseline and IMTS was consulted for admission for presumed COPD exacerbation.  Meds:  Current Meds  Medication Sig  . acetaminophen (TYLENOL) 500 MG tablet Take 1,000 mg by mouth every 6 (six) hours as needed for mild pain or headache.   . albuterol (PROVENTIL HFA;VENTOLIN HFA) 108 (90 BASE) MCG/ACT inhaler Inhale 2 puffs into the lungs 4 (four) times daily as needed for wheezing or shortness of breath.   . bisoprolol (ZEBETA) 5 MG tablet Take 1 tablet (5 mg total) by mouth daily.  . colchicine 0.6 MG tablet Take 1 tablet (0.6 mg total) by mouth 2 (two) times daily.  . cyclobenzaprine (FLEXERIL) 10 MG tablet Take 10 mg by mouth at bedtime as needed for muscle spasms.   . diclofenac sodium (VOLTAREN) 1 % GEL Apply 4 g topically 4 (four) times daily as needed (pain).   [name] empagliflozin (JARDIANCE) 25 MG TABS tablet Take 12.5 mg (0.5 tablets) by mouth  daily  . furosemide (LASIX) 20 MG tablet Take 1 tablet (20 mg total) by mouth daily as needed for fluid or edema.  [name] HYDROcodone-acetaminophen (NORCO/VICODIN) 5-325 MG tablet Take 1  tablet by mouth 3 (three) times daily as needed (for pain).   [name] ipratropium-albuterol (DUONEB) 0.5-2.5 (3) MG/3ML SOLN Take 3 mLs by nebulization every 6 (six) hours as needed (shortness of breath).   . Naphazoline HCl (CLEAR EYES OP) Place 1 drop into both eyes daily as needed (allergies).  [name] omeprazole (PRILOSEC) 20 MG capsule Take 20 mg by mouth daily.  . sacubitril-valsartan (ENTRESTO) 49-51 MG Take 0.5 tablets by mouth 2 (two) times daily.  . simvastatin (ZOCOR) 20 MG tablet Take 10 mg by mouth at bedtime.  [name] spironolactone (ALDACTONE) 25 MG tablet Take 1 tablet (25 mg total) by mouth daily.  . tamsulosin  (FLOMAX) 0.4 MG CAPS capsule Take 1 capsule (0.4 mg total) by mouth at bedtime.     Allergies: Allergies as of 03/05/2020  . (No Known Allergies)   Past Medical History:  Diagnosis Date  . Coronary artery disease   . High cholesterol   . Hypertension   . 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 Prostate cancer ([place])   . PTSD (post-traumatic stress disorder)     Family History:  No family history of cancer.   Social History:  Former smoker, drinks 6 cans of beer per week. Sometimes drink more on weekends (6 beers a day during weekend). Denies withdrawal on the days that he does not drink. Denies illicit drug use.  Review of Systems: A complete ROS was negative except as per HPI.   Physical Exam: Blood pressure 112/71, pulse (!) 103, temperature 98.3 F (36.8 C), temperature source Oral, resp. rate (!) 27, height 5\' 11"  (1.803 m), weight 85.3 kg, SpO2 94 %.  Physical Exam Constitutional:      Appearance: He is well-developed. He is not ill-appearing or toxic-appearing.  HENT:     Head: Normocephalic and atraumatic.  Cardiovascular:     Rate and Rhythm: Regular rhythm. Tachycardia present.     Heart sounds: No murmur heard.   Pulmonary:     Effort: Pulmonary effort is normal. Tachypnea present. No respiratory distress.     Breath sounds: No wheezing, rhonchi or rales.     Comments: No wheezing at the time of admission on my evaluation. Has episodes of continuous cough during the encounter.  Abdominal:     Palpations: Abdomen is soft.     Tenderness: There is no abdominal tenderness.  Musculoskeletal:     Right lower leg: No edema.     Left lower leg: No edema.  Skin:    General: Skin is warm and dry.  Neurological:     General: No focal deficit present.     Mental Status: He is alert and oriented to person, place, and time.  Psychiatric:        Mood and Affect: Mood normal.        Behavior: Behavior normal.    EKG: personally reviewed my interpretation  is ventricular paced  CXR: personally reviewed my interpretation is no acute cardiopulmonary event  Assessment & Plan by Problem: Active problem:  Persistent episodic cough associated with presyncopal episode: Presumed COPD exacerbation  Patient presented with (recurrent) worsening of cough spells that associates with presyncopal episode.   He was tachypneic, slightly tachycardic and had some wheezing? on arrival. (No wheezing on admission though) No fever chills and. Lung exam is now unremarkable. No leukocytosis. CXR clear.   Admitted for COPD exacerbation. However, not very classic presentation. His main complaint is episodic cough spells. No SOB unless the cough  happens. No significant wheezing now (he says he wheezes when he coughs though). Will treat for probable COPD exacerbation as he noted to have wheezing on arrival per EDP but will consider other etiology of chronic persistent cough.   RVP is pending.  He is on Entresto at home that can be contributing to his chronic persistent cough. Also although rare, autonomic causes such as vagus nerve irritation can be on differential as pt also develops lightheadedness each time he has the cough spell.  -DuoNeb every 6 hours -Albuterol PRN -Will give 5 day total of steroid. Received Solumedrol in ED. Giving Prednison 40 mg QD for 4 more days -Z pak for 5 days -F/u RVP and covid test -No PFT in chart. Recommending PFT out patient for definite Dx of COPD. -Consider other etiology of choric-persistent cough as above, particularly if no improvement with COPD Tx . Of note, he was not a heavy smoker. -Guaifenesin-codein PRN for cough (did not respond to other cough medicines) -Protonix 40 mg QD  CAD -Continue home simvastatin 10 mg daily  CHB s/p pacemaker implant Right and left heart cath 11/26/2019 Complete heart block status post pacemaker implant 07/02/2019> BIV upgrade of [employer] pacemaker 02/18/2020 due to presumed pacemaker  induced persistant LV dysfunction.  CXR today: Biventricular pacer from the left in unremarkable position. There is extensive artifact from EKG leads. There is no edema, consolidation, effusion, or pneumothorax.   HFrEF: Last echo 01/16/2020: EF remains low at 20-25%> pacemaker upgraded to CRT.  No recent echo after BIV upgrade is available.   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 LV 1 device reprogrammed. The plan was getting a CXR and go back to clinic to recheck LV lead.  CXR today showed that pace maker lead is in unremarkable place Home meds: Bisoprolol 5 mg dailyardiance 12.5 mg daily, Lasix 20 mg as needed, Entresto 49-51, 0.5 tablets twice daily, spironolactone 25 mg daily.  -Patient is euvolemic on exam -Continue home meds. -Heart healthy diet.  -Weight daily -I/O  HTN: Patient is normotensive  -Continue home meds  BPH: -On Flomax 0.4 mg daily at home  Alcohol use: Drinks several (sometimes 6)beers a day on weekend but does not drink alcohol every day on week days. Denies Hx of withdrawal.  -CIWA  -Thiamin and acid folic supplement  Chronic back pain 2/2 remote injury at [facility] home Oxycodone (Do not administer within 4 h from codeine)  Diet: HH IV fluid: None VTE ppx: Lovenox Code status: Full  Dispo: Admit patient to Inpatient with expected length of stay greater than 2 midnights.  SignedDewayne Hatch, MD 03/05/2020, 3:27 PM  Pager: [phone] After 5pm on weekdays and 1pm on weekends: On Call pager: [phone]

## 2020-03-05 NOTE — ED Notes (Addendum)
Per Caryl Pina RN, room still isn't ready even though bed says ready. Caryl Pina RN took report. Notified Agricultural consultant and [employer]. Bed has been ready on the board for 35 min at this time.

## 2020-03-05 NOTE — ED Notes (Addendum)
Bed status now saying approved. Soil scientist

## 2020-03-05 NOTE — ED Notes (Signed)
Pt reports he is not a daily drinker. CIWA 0.

## 2020-03-05 NOTE — ED Notes (Signed)
Per floor, room is still not ready. Soil scientist

## 2020-03-06 DIAGNOSIS — E785 Hyperlipidemia, unspecified: Secondary | ICD-10-CM | POA: Diagnosis present

## 2020-03-06 DIAGNOSIS — I110 Hypertensive heart disease with heart failure: Secondary | ICD-10-CM | POA: Diagnosis present

## 2020-03-06 DIAGNOSIS — J441 Chronic obstructive pulmonary disease with (acute) exacerbation: Principal | ICD-10-CM

## 2020-03-06 DIAGNOSIS — F4310 Post-traumatic stress disorder, unspecified: Secondary | ICD-10-CM | POA: Diagnosis present

## 2020-03-06 DIAGNOSIS — I10 Essential (primary) hypertension: Secondary | ICD-10-CM

## 2020-03-06 DIAGNOSIS — Z7951 Long term (current) use of inhaled steroids: Secondary | ICD-10-CM | POA: Diagnosis not present

## 2020-03-06 DIAGNOSIS — E7800 Pure hypercholesterolemia, unspecified: Secondary | ICD-10-CM | POA: Diagnosis present

## 2020-03-06 DIAGNOSIS — G8929 Other chronic pain: Secondary | ICD-10-CM | POA: Diagnosis present

## 2020-03-06 DIAGNOSIS — R55 Syncope and collapse: Secondary | ICD-10-CM | POA: Diagnosis present

## 2020-03-06 DIAGNOSIS — Z8546 Personal history of malignant neoplasm of prostate: Secondary | ICD-10-CM | POA: Diagnosis not present

## 2020-03-06 DIAGNOSIS — R053 Chronic cough: Secondary | ICD-10-CM | POA: Diagnosis not present

## 2020-03-06 DIAGNOSIS — D649 Anemia, unspecified: Secondary | ICD-10-CM | POA: Diagnosis present

## 2020-03-06 DIAGNOSIS — Z87891 Personal history of nicotine dependence: Secondary | ICD-10-CM | POA: Diagnosis not present

## 2020-03-06 DIAGNOSIS — I2510 Atherosclerotic heart disease of native coronary artery without angina pectoris: Secondary | ICD-10-CM | POA: Diagnosis present

## 2020-03-06 DIAGNOSIS — I5022 Chronic systolic (congestive) heart failure: Secondary | ICD-10-CM | POA: Diagnosis present

## 2020-03-06 DIAGNOSIS — M545 Low back pain, unspecified: Secondary | ICD-10-CM | POA: Diagnosis present

## 2020-03-06 DIAGNOSIS — Z950 Presence of cardiac pacemaker: Secondary | ICD-10-CM | POA: Diagnosis not present

## 2020-03-06 DIAGNOSIS — Z79899 Other long term (current) drug therapy: Secondary | ICD-10-CM | POA: Diagnosis not present

## 2020-03-06 DIAGNOSIS — N400 Enlarged prostate without lower urinary tract symptoms: Secondary | ICD-10-CM | POA: Diagnosis present

## 2020-03-06 DIAGNOSIS — Z20822 Contact with and (suspected) exposure to covid-19: Secondary | ICD-10-CM | POA: Diagnosis present

## 2020-03-06 LAB — BASIC METABOLIC PANEL
Anion gap: 8 (ref 5–15)
BUN: 14 mg/dL (ref 8–23)
CO2: 24 mmol/L (ref 22–32)
Calcium: 9 mg/dL (ref 8.9–10.3)
Chloride: 107 mmol/L (ref 98–111)
Creatinine, Ser: 1.03 mg/dL (ref 0.61–1.24)
GFR, Estimated: >60 mL/min (ref >60)
Glucose, Bld: 124 mg/dL — ABNORMAL HIGH (ref 70–99)
Potassium: 3.7 mmol/L (ref 3.5–5.1)
Sodium: 139 mmol/L (ref 135–145)

## 2020-03-06 LAB — MAGNESIUM: Magnesium: 2.1 mg/dL (ref 1.7–2.4)

## 2020-03-06 LAB — CBC
HCT: 36.6 % — ABNORMAL LOW (ref 39.0–52.0)
Hemoglobin: 12 g/dL — ABNORMAL LOW (ref 13.0–17.0)
MCH: 27.7 pg (ref 26.0–34.0)
MCHC: 32.8 g/dL (ref 30.0–36.0)
MCV: 84.5 fL (ref 80.0–100.0)
Platelets: 283 10*3/uL (ref 150–400)
RBC: 4.33 MIL/uL (ref 4.22–5.81)
RDW: 14.6 % (ref 11.5–15.5)
WBC: 10.5 10*3/uL (ref 4.0–10.5)
nRBC: 0 % (ref 0.0–0.2)

## 2020-03-06 MED ORDER — BUDESONIDE 0.25 MG/2ML IN SUSP
0.2500 mg | Freq: Two times a day (BID) | RESPIRATORY_TRACT | Status: DC
  Administered 2020-03-07 – 2020-03-08 (×3): 0.25 mg via RESPIRATORY_TRACT
  Filled 2020-03-06 (×4): qty 2

## 2020-03-06 MED ORDER — FAMOTIDINE 20 MG PO TABS
20.0000 mg | ORAL_TABLET | Freq: Every day | ORAL | Status: DC
  Administered 2020-03-06 – 2020-03-08 (×3): 20 mg via ORAL
  Filled 2020-03-06 (×3): qty 1

## 2020-03-06 MED ORDER — IPRATROPIUM-ALBUTEROL 0.5-2.5 (3) MG/3ML IN SOLN
3.0000 mL | Freq: Three times a day (TID) | RESPIRATORY_TRACT | Status: DC
  Administered 2020-03-06 (×3): 3 mL via RESPIRATORY_TRACT
  Filled 2020-03-06 (×3): qty 3

## 2020-03-06 MED ORDER — GABAPENTIN 600 MG PO TABS
300.0000 mg | ORAL_TABLET | Freq: Three times a day (TID) | ORAL | Status: DC
  Administered 2020-03-06 – 2020-03-08 (×6): 300 mg via ORAL
  Filled 2020-03-06 (×6): qty 1

## 2020-03-06 NOTE — Progress Notes (Signed)
HD#1 Subjective:   Overnight, night team paged by nurse after witnessed seizure-like activity following a coughing episode. Nurse reported patient had shaking of his right hand, eyes rolled back in his head, and he became unresponsive. The episode lasted 1 minute and the patient was back to his baseline and could remember the event. VSS.   On evaluation at bedside during rounds this morning, patient states he had a "rough night" due to episodes of coughing and the reported episode of shaking described above. He reports his presyncopal episodes occur only after coughing spells. Describes he can "feel it coming on" and gets a tingly feeling that starts in his head and moves down his body. Notes that the duonebs he has been receiving are helpful only for 30 minutes in controlling his wheezing/cough.  Objective:   Vital signs in last 24 hours: Vitals:   03/05/20 2055 03/06/20 0529 03/06/20 0558 03/06/20 0731  BP: 107/69 113/75    Pulse: 90 86    Resp: 19 20    Temp: 97.9 F (36.6 C)  98.1 F (36.7 C)   TempSrc: Oral  Oral   SpO2:  96%  96%  Weight:  87.1 kg    Height:       Physical Exam Constitutional: well-appearing gentleman sitting in bedside chair, in no acute distress Cardiovascular: regular rate and rhythm, no m/r/g Pulmonary/Chest: normal work of breathing on room air, diffuse wheezes throughout, short ~10-second episode of coughing during encounter  Pertinent Labs: CBC Latest Ref Rng & Units 03/06/2020 03/05/2020 03/05/2020  WBC 4.0 - 10.5 K/uL 10.5 - 6.7  Hemoglobin 13.0 - 17.0 g/dL 12.0(L) 12.9(L) 12.8(L)  Hematocrit 39 - 52 % 36.6(L) 38.0(L) 40.1  Platelets 150 - 400 K/uL 283 - 291    CMP Latest Ref Rng & Units 03/06/2020 03/05/2020 03/03/2020  Glucose 70 - 99 mg/dL 124(H) 112(H) 162(H)  BUN 8 - 23 mg/dL 14 8 8   Creatinine 0.61 - 1.24 mg/dL 1.03 1.00 1.10  Sodium 135 - 145 mmol/L 139 140 139  Potassium 3.5 - 5.1 mmol/L 3.7 4.0 3.9  Chloride 98 - 111 mmol/L 107  104 105  CO2 22 - 32 mmol/L 24 - 25  Calcium 8.9 - 10.3 mg/dL 9.0 - 9.4  Total Protein 6.5 - 8.1 g/dL - - -  Total Bilirubin 0.3 - 1.2 mg/dL - - -  Alkaline Phos 38 - 126 U/L - - -  AST 15 - 41 U/L - - -  ALT 0 - 44 U/L - - -    Imaging:  DG Chest 2 View  Result Date: 03/05/2020 CLINICAL DATA:  Cough EXAM: CHEST - 2 VIEW COMPARISON:  Two days ago FINDINGS: Biventricular pacer from the left in unremarkable position. There is extensive artifact from EKG leads. There is no edema, consolidation, effusion, or pneumothorax. IMPRESSION: No evidence of active disease. Electronically Signed   By: Monte Fantasia M.D.   On: 03/05/2020 04:50    Assessment/Plan:   Active Problems:   COPD exacerbation (HCC)   Persistent cough   Patient Summary:  Glenn Brandt is a 59 year old man with COPD, HTN, HLD, CAD, systolic HF, CHB s/p AV pacemaker placement, prostate cancer, who was admitted for further evaluation and work-up of chronic episodic cough associated with presyncopal episodes.  This is hospital day 1.  Chronic episodic cough associated with presyncopal episodes Possible COPD exacerbation Patient reports he had another presyncopal episode accompanied by shaking of his hand after a coughing fit overnight. Reports  his presyncopal episodes occur only after coughing spells. He continues to be well-appearing. No longer tachycardic or tachypneic. Exam significant for diffuse wheezing however otherwise unremarkable. Will continue to treat for possible COPD exacerbation and add treatments to target his cough which may be secondary to asthma/COPD vs vagal nerve irritation vs reflux - gabapentin 300 mg TID, famotidine 20 mg daily, and budesonide nebulization twice daily. RVP and covid negative.  - start gabapentin 300 mg three times daily - start famotidine 20 mg daily - start budesonide nebulization twice daily - prednisone 40 mg day 1/4 - azithromycin 250 mg daily day 2/5 - Duonebs q6h -  albuterol PRN - Protonix 40 mg daily - Guaifenesin-codeine PRN   CAD -Continue home simvastatin 10 mg daily  HFrEF Home meds: Bisoprolol 5 mg dailyardiance 12.5 mg daily, Lasix 20 mg as needed, Entresto 49-51, 0.5 tablets twice daily, spironolactone 25 mg daily. - Continue home meds - Daily weights - Strict I/O  HTN Patient is normotensive  -Continue home meds  Chronic back pain 2/2 remote injury at [place] home Oxycodone (Do not administer within 4 h from codeine)   Diet: Heart Healthy IVF: None,None VTE: Enoxaparin Code: Full   Dispo: Anticipated discharge to Home in 1-2 days pending improvement in cough/pre-syncopal episodes.   Please contact the on call pager after 5 pm and on weekends at [phone].  Alexandria Lodge, MD PGY-1 Internal Medicine Teaching Service Pager: [phone] 03/06/2020

## 2020-03-06 NOTE — Progress Notes (Signed)
Paged by RN with witnessed seizure like activity.   States that at the end of a coughing episode, he had some shaking of his right hand. This stopped, and his eyes rolled back in his head and be became unresponsive for a period of time. He was after that able to respond verbally. The entire episode lasted about 1 minute and then he was back to baseline. He has recollection of the entire episode and prior ones as well.  States the patient has complained of these in the past, and they happen always after a coughing spell. States he feels them coming on and can recall them afterwards.   Vitals are within normal limits.  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

## 2020-03-06 NOTE — Progress Notes (Signed)
Pt had a coughing spell. This RN was across the hall and went to check on pt. Pt requested ice water. Ice water provided to pt. Pt continued having a pretty extensive cough fit and then his right hand began shaking, eyes rolled in the back of his head and he was unresponsive for a few seconds. This RN asked if pt was okay. Pt came to and stated, "you saw that? I've been telling them this was happening." Pt reassured that this writer did witness the event and would make sure the doctor was aware. Pt alert and oriented x4 and able to recall events leading up to and during the episode. Pt states, "every time I have a coughing fit like this I can feel it coming on. It starts in my head, then moves down my body. It feels like pins and needles and then everything goes numb." Vitals stable. On Call physician notified. Will continue to monitor pt.

## 2020-03-07 DIAGNOSIS — D509 Iron deficiency anemia, unspecified: Secondary | ICD-10-CM

## 2020-03-07 LAB — CBC WITH DIFFERENTIAL/PLATELET
Abs Immature Granulocytes: 0.04 10*3/uL (ref 0.00–0.07)
Basophils Absolute: 0 10*3/uL (ref 0.0–0.1)
Basophils Relative: 0 %
Eosinophils Absolute: 0.1 10*3/uL (ref 0.0–0.5)
Eosinophils Relative: 2 %
HCT: 37 % — ABNORMAL LOW (ref 39.0–52.0)
Hemoglobin: 12.1 g/dL — ABNORMAL LOW (ref 13.0–17.0)
Immature Granulocytes: 0 %
Lymphocytes Relative: 19 %
Lymphs Abs: 1.7 10*3/uL (ref 0.7–4.0)
MCH: 27.6 pg (ref 26.0–34.0)
MCHC: 32.7 g/dL (ref 30.0–36.0)
MCV: 84.3 fL (ref 80.0–100.0)
Monocytes Absolute: 0.8 10*3/uL (ref 0.1–1.0)
Monocytes Relative: 8 %
Neutro Abs: 6.6 10*3/uL (ref 1.7–7.7)
Neutrophils Relative %: 71 %
Platelets: 293 10*3/uL (ref 150–400)
RBC: 4.39 MIL/uL (ref 4.22–5.81)
RDW: 14.8 % (ref 11.5–15.5)
WBC: 9.3 10*3/uL (ref 4.0–10.5)
nRBC: 0 % (ref 0.0–0.2)

## 2020-03-07 LAB — IRON AND TIBC
Iron: 64 ug/dL (ref 45–182)
Saturation Ratios: 21 % (ref 17.9–39.5)
TIBC: 311 ug/dL (ref 250–450)
UIBC: 247 ug/dL

## 2020-03-07 LAB — BASIC METABOLIC PANEL
Anion gap: 9 (ref 5–15)
BUN: 14 mg/dL (ref 8–23)
CO2: 23 mmol/L (ref 22–32)
Calcium: 8.8 mg/dL — ABNORMAL LOW (ref 8.9–10.3)
Chloride: 107 mmol/L (ref 98–111)
Creatinine, Ser: 1.03 mg/dL (ref 0.61–1.24)
GFR, Estimated: >60 mL/min (ref >60)
Glucose, Bld: 117 mg/dL — ABNORMAL HIGH (ref 70–99)
Potassium: 3.6 mmol/L (ref 3.5–5.1)
Sodium: 139 mmol/L (ref 135–145)

## 2020-03-07 LAB — FERRITIN: Ferritin: 97 ng/mL (ref 24–336)

## 2020-03-07 MED ORDER — IPRATROPIUM-ALBUTEROL 0.5-2.5 (3) MG/3ML IN SOLN
3.0000 mL | Freq: Two times a day (BID) | RESPIRATORY_TRACT | Status: DC
  Administered 2020-03-07 – 2020-03-08 (×3): 3 mL via RESPIRATORY_TRACT
  Filled 2020-03-07 (×3): qty 3

## 2020-03-07 NOTE — Progress Notes (Signed)
Patient assessed.  Reviewed LPN documentation, and agree with the findings.  

## 2020-03-07 NOTE — Plan of Care (Signed)

## 2020-03-07 NOTE — Progress Notes (Signed)
HD#2 Subjective:   No acute events overnight.  Evaluated at bedside during rounds. Patient reports he had another syncopal episode after a coughing bout last night. He states he felt the same "pins and needle" sensation all over; however, reports it was different this time as the shakiness remained. He normally closes his eyes during these episodes because otherwise his vision goes black. He states he does not pass out, he is aware of everything going on but he does not like what he is seeing. He feels his vision gets blurry during the episodes. He has noticed an improvement in his breathing but not his coughing. Discussed that cards does not think the pacemaker is causing the cough. He has an appointment with his pulmonologist on 11/2. He drives himself. Does not have anyone that can drive him in the interim. Discussed the risk of having an episode while driving, recommended not driving himself. Denies runny nose, post nasal drip. Reports soreness in throat from coughing.   Objective:   Vital signs in last 24 hours: Vitals:   03/06/20 1452 03/06/20 2003 03/06/20 2115 03/07/20 0608  BP:   125/76 122/70  Pulse:   93 90  Resp:   18 16  Temp:   98.4 F (36.9 C) 98.2 F (36.8 C)  TempSrc:    Oral  SpO2: 97% 97% 95% 96%  Weight:      Height:       Physical Exam Constitutional: well-appearing gentleman sitting in bedside chair, in no acute distress Cardiovascular: regular rate and rhythm, no m/r/g Pulmonary/Chest: normal work of breathing on room air, end-expiratory wheezing throughout though improved from yesterday  Pertinent Labs: CBC Latest Ref Rng & Units 03/07/2020 03/06/2020 03/05/2020  WBC 4.0 - 10.5 K/uL 9.3 10.5 -  Hemoglobin 13.0 - 17.0 g/dL 12.1(L) 12.0(L) 12.9(L)  Hematocrit 39 - 52 % 37.0(L) 36.6(L) 38.0(L)  Platelets 150 - 400 K/uL 293 283 -    CMP Latest Ref Rng & Units 03/07/2020 03/06/2020 03/05/2020  Glucose 70 - 99 mg/dL 117(H) 124(H) 112(H)  BUN 8 - 23 mg/dL  14 14 8   Creatinine 0.61 - 1.24 mg/dL 1.03 1.03 1.00  Sodium 135 - 145 mmol/L 139 139 140  Potassium 3.5 - 5.1 mmol/L 3.6 3.7 4.0  Chloride 98 - 111 mmol/L 107 107 104  CO2 22 - 32 mmol/L 23 24 -  Calcium 8.9 - 10.3 mg/dL 8.8(L) 9.0 -  Total Protein 6.5 - 8.1 g/dL - - -  Total Bilirubin 0.3 - 1.2 mg/dL - - -  Alkaline Phos 38 - 126 U/L - - -  AST 15 - 41 U/L - - -  ALT 0 - 44 U/L - - -    Assessment/Plan:   Active Problems:   COPD exacerbation (HCC)   Persistent cough   Syncope   Patient Summary:  Glenn Brandt is a 59 year old man with COPD, HTN, HLD, CAD, systolic HF, CHB s/p AV pacemaker placement, prostate cancer, who was admitted for further evaluation and work-up of chronic episodic cough associated with presyncopal episodes.  This is hospital day 2.  Chronic episodic cough associated with presyncopal episodes Possible COPD exacerbation Patient states he has noticed an improvement in his breathing after addition of budesonide nebulization, however reports he had another presyncopal episode overnight associated with overall tingling and lingering tremulousness. No LOC. He continues to be well-appearing. No longer tachycardic or tachypneic. End-expiratory wheezing improved today. Discussed with cardiology electrophysiology who interrogated patient's pacemaker and do not think  his cough is related to his pacemaker or its placement. Will continue to treat for possible COPD exacerbation and continue targeting cough as below. Patient has pulmonologist at the [facility]; recommend outpatient PFTs at discharge.  - continue gabapentin 300 mg three times daily - continue famotidine 20 mg daily - continue budesonide nebulization twice daily - prednisone 40 mg day 2/4 - azithromycin 250 mg daily day 3/5 - Duonebs q6h - albuterol PRN - Protonix 40 mg daily - Guaifenesin-codeine PRN   Normocytic anemia Hemoglobin stable ~12 this admission with MCV 84. Iron studies within normal limits -  ferritin 97, iron 64, TIBC 311. No signs of bleeding. - Will continue to monitor  CAD -Continue home simvastatin 10 mg daily  HFrEF Home meds: Bisoprolol 5 mg dailyardiance 12.5 mg daily, Lasix 20 mg as needed, Entresto 49-51, 0.5 tablets twice daily, spironolactone 25 mg daily. - Continue home meds - Daily weights - Strict I/O  HTN Patient is normotensive  -Continue home meds  Chronic back pain 2/2 remote injury at [place] home Oxycodone (Do not administer within 4 h from codeine)   Diet: Heart Healthy IVF: None,None VTE: Enoxaparin Code: Full   Dispo: Anticipated discharge to Home in 1-2 days pending further improvement in cough/pre-syncopal episodes.   Please contact the on call pager after 5 pm and on weekends at [phone].  Alexandria Lodge, MD PGY-1 Internal Medicine Teaching Service Pager: [phone] 03/07/2020

## 2020-03-08 LAB — CBC WITH DIFFERENTIAL/PLATELET
Abs Immature Granulocytes: 0.06 10*3/uL (ref 0.00–0.07)
Basophils Absolute: 0 10*3/uL (ref 0.0–0.1)
Basophils Relative: 0 %
Eosinophils Absolute: 0.1 10*3/uL (ref 0.0–0.5)
Eosinophils Relative: 1 %
HCT: 37.6 % — ABNORMAL LOW (ref 39.0–52.0)
Hemoglobin: 12.2 g/dL — ABNORMAL LOW (ref 13.0–17.0)
Immature Granulocytes: 1 %
Lymphocytes Relative: 18 %
Lymphs Abs: 1.4 10*3/uL (ref 0.7–4.0)
MCH: 27.2 pg (ref 26.0–34.0)
MCHC: 32.4 g/dL (ref 30.0–36.0)
MCV: 83.9 fL (ref 80.0–100.0)
Monocytes Absolute: 0.7 10*3/uL (ref 0.1–1.0)
Monocytes Relative: 9 %
Neutro Abs: 5.8 10*3/uL (ref 1.7–7.7)
Neutrophils Relative %: 71 %
Platelets: 293 10*3/uL (ref 150–400)
RBC: 4.48 MIL/uL (ref 4.22–5.81)
RDW: 14.6 % (ref 11.5–15.5)
WBC: 8.1 10*3/uL (ref 4.0–10.5)
nRBC: 0 % (ref 0.0–0.2)

## 2020-03-08 LAB — BASIC METABOLIC PANEL
Anion gap: 8 (ref 5–15)
BUN: 16 mg/dL (ref 8–23)
CO2: 25 mmol/L (ref 22–32)
Calcium: 9 mg/dL (ref 8.9–10.3)
Chloride: 106 mmol/L (ref 98–111)
Creatinine, Ser: 1.07 mg/dL (ref 0.61–1.24)
GFR, Estimated: >60 mL/min (ref >60)
Glucose, Bld: 126 mg/dL — ABNORMAL HIGH (ref 70–99)
Potassium: 3.8 mmol/L (ref 3.5–5.1)
Sodium: 139 mmol/L (ref 135–145)

## 2020-03-08 MED ORDER — AZITHROMYCIN 250 MG PO TABS: 250.0000 mg | ORAL_TABLET | Freq: Every day | ORAL | 0 refills | Status: AC

## 2020-03-08 MED ORDER — PREDNISONE 20 MG PO TABS: 40.0000 mg | ORAL_TABLET | Freq: Every day | ORAL | 0 refills | Status: AC

## 2020-03-08 MED ORDER — BUDESONIDE 0.25 MG/2ML IN SUSP: 0.2500 mg | Freq: Two times a day (BID) | RESPIRATORY_TRACT | 0 refills | Status: DC

## 2020-03-08 MED ORDER — FAMOTIDINE 20 MG PO TABS: 20.0000 mg | ORAL_TABLET | Freq: Every day | ORAL | 0 refills | Status: DC

## 2020-03-08 MED ORDER — GABAPENTIN 600 MG PO TABS: 300.0000 mg | ORAL_TABLET | Freq: Three times a day (TID) | ORAL | 0 refills | Status: DC

## 2020-03-08 NOTE — Discharge Instructions (Addendum)
  Mr. Glenn Brandt,   It was a pleasure taking care of you. You were admitted to the hospital and treated for pre-syncopal episodes from coughing. We treated you for a COPD exacerbation and also started some medications to treat your cough. Your wheezing has improved significantly. We suspect your coughing spells are related to your lung disease, and we would like you to keep your previously scheduled follow-up appointment with your pulmonologist. The cardiologist (heart doctor) do not think your cough has anything to do with your pacemaker.  We have prescribed the following medications for you to continue after leaving the hospital: - azithromycin 250 mg daily for 2 more doses, tomorrow (10/31) and Monday (11/1) - prednisone 40 mg daily for 2 more doses, tomorrow (10/31) and Monday (11/1) - budesonide (Pulmicort) 2 mLs (0.25 mg total) by nebulization twice daily - until told otherwise by your pulmonologist - famotidine (Pepcid) 20 mg daily starting tomorrow - gabapentin (Neurontin) 300 mg (1/2 tablet) three times daily  Continue the rest of your medications as directed.  We would like for you to follow-up with your pulmonologist as well as your primary care doctor.  Take care!

## 2020-03-08 NOTE — Progress Notes (Signed)
HD#3 Subjective:   No acute events overnight.  This morning during rounds, patient reports feeling better. He states he did have a coughing spell overnight but did not have any "blackout events." States he had a little bit of wheezing this morning. Discussed the importance of his follow up appt with pulmonology, and he expressed understanding.  Objective:   Vital signs in last 24 hours: Vitals:   03/07/20 1410 03/07/20 1933 03/07/20 1934 03/07/20 2111  BP: 107/88   111/79  Pulse: 79  83 90  Resp: 18  18   Temp: 98.1 F (36.7 C)   98.4 F (36.9 C)  TempSrc:    Oral  SpO2: 96% 99% 99%   Weight:      Height:       Physical Exam Constitutional: well-appearing gentleman sitting in bedside chair, in no acute distress Cardiovascular: regular rate and rhythm, no m/r/g Pulmonary/Chest: normal work of breathing on room air, no wheezing appreciated  Pertinent Labs: CBC Latest Ref Rng & Units 03/08/2020 03/07/2020 03/06/2020  WBC 4.0 - 10.5 K/uL 8.1 9.3 10.5  Hemoglobin 13.0 - 17.0 g/dL 12.2(L) 12.1(L) 12.0(L)  Hematocrit 39 - 52 % 37.6(L) 37.0(L) 36.6(L)  Platelets 150 - 400 K/uL 293 293 283    CMP Latest Ref Rng & Units 03/08/2020 03/07/2020 03/06/2020  Glucose 70 - 99 mg/dL 126(H) 117(H) 124(H)  BUN 8 - 23 mg/dL 16 14 14   Creatinine 0.61 - 1.24 mg/dL 1.07 1.03 1.03  Sodium 135 - 145 mmol/L 139 139 139  Potassium 3.5 - 5.1 mmol/L 3.8 3.6 3.7  Chloride 98 - 111 mmol/L 106 107 107  CO2 22 - 32 mmol/L 25 23 24   Calcium 8.9 - 10.3 mg/dL 9.0 8.8(L) 9.0  Total Protein 6.5 - 8.1 g/dL - - -  Total Bilirubin 0.3 - 1.2 mg/dL - - -  Alkaline Phos 38 - 126 U/L - - -  AST 15 - 41 U/L - - -  ALT 0 - 44 U/L - - -    Assessment/Plan:   Active Problems:   COPD exacerbation (HCC)   Persistent cough   Syncope   Patient Summary:  Glenn Brandt is a 59 year old man with COPD, HTN, HLD, CAD, systolic HF, CHB s/p AV pacemaker placement, prostate cancer, who was admitted for  further evaluation and work-up of chronic episodic cough associated with presyncopal episodes.  This is hospital day 3. Patient to discharge home today.  Chronic episodic cough associated with presyncopal episodes Possible COPD exacerbation Patient reports improvement in cough and wheezing. He continues to be well-appearing with stable vitals. No wheezing on exam today. Will discharge home to complete course of azithromycin and prednisone for presumed COPD exacerbation as well as continue cough treatments as below. Patient has pulmonologist at the [facility] with whom he is scheduled to follow-up on 03/11/20.   - continue gabapentin 300 mg three times daily - continue famotidine 20 mg daily - continue budesonide nebulization twice daily - prednisone 40 mg day - patient to complete 2 additional days, 03/09/10/1 - azithromycin 250 mg daily - patient to complete 2 additional days, 10/31-11/1 - albuterol PRN - Protonix 40 mg daily   Normocytic anemia, stable Hemoglobin stable ~12 this admission with MCV 84. Iron studies within normal limits - ferritin 97, iron 64, TIBC 311. No signs of bleeding.  CAD - Continue home simvastatin 10 mg daily  HFrEF Home meds: Bisoprolol 5 mg dailyardiance 12.5 mg daily, Lasix 20 mg as needed, Entresto 49-51,  0.5 tablets twice daily, spironolactone 25 mg daily. - Continue home meds at discharge  HTN Patient is normotensive  -Continue home meds at discharge  Chronic back pain 2/2 remote injury at [place] home Oxycodone (Do not administer within 4 h from codeine)   Diet: Heart Healthy IVF: None,None VTE: Enoxaparin Code: Full   Dispo: Anticipated discharge to Home today. Prescriptions sent to [facility] on [address] since patient will not be able to pick up prescriptions at the [facility] this weekend.  Please contact the on call pager after 5 pm and on weekends at [phone].  Alexandria Lodge, MD PGY-1 Internal Medicine Teaching Service Pager:  [phone] 03/08/2020

## 2020-03-08 NOTE — Discharge Summary (Signed)
Name: Glenn Brandt MRN: [id number] DOB: Oct 22, 1960 59 y.o. PCP: [facility] [place]  Date of Admission: 03/05/2020  3:26 AM Date of Discharge: 03/09/20 Attending Physician: Dr. Gilles Chiquito  Discharge Diagnosis:  1. COPD exacerbation 2. Chronic cough 3. Pre-syncope  Discharge Medications: Allergies as of 03/08/2020   No Known Allergies     Medication List    STOP taking these medications   benzonatate 100 MG capsule Commonly known as: TESSALON   menthol-cetylpyridinium 3 MG lozenge Commonly known as: CEPACOL   phenol 1.4 % Liqd Commonly known as: CHLORASEPTIC     TAKE these medications   acetaminophen 500 MG tablet Commonly known as: TYLENOL Take 1,000 mg by mouth every 6 (six) hours as needed for mild pain or headache.   albuterol 108 (90 Base) MCG/ACT inhaler Commonly known as: VENTOLIN HFA Inhale 2 puffs into the lungs 4 (four) times daily as needed for wheezing or shortness of breath.   azithromycin 250 MG tablet Commonly known as: Zithromax Take 1 tablet (250 mg total) by mouth daily for 2 doses. Start taking on: March 09, 2020   bisoprolol 5 MG tablet Commonly known as: ZEBETA Take 1 tablet (5 mg total) by mouth daily.   budesonide 0.25 MG/2ML nebulizer solution Commonly known as: PULMICORT Take 2 mLs (0.25 mg total) by nebulization 2 (two) times daily.   CLEAR EYES OP Place 1 drop into both eyes daily as needed (allergies).   colchicine 0.6 MG tablet Take 1 tablet (0.6 mg total) by mouth 2 (two) times daily.   cyclobenzaprine 10 MG tablet Commonly known as: FLEXERIL Take 10 mg by mouth at bedtime as needed for muscle spasms.   diclofenac sodium 1 % Gel Commonly known as: VOLTAREN Apply 4 g topically 4 (four) times daily as needed (pain).   empagliflozin 25 MG Tabs tablet Commonly known as: JARDIANCE Take 12.5 mg (0.5 tablets) by mouth  daily   Entresto 49-51 MG Generic drug: sacubitril-valsartan Take 0.5 tablets by mouth 2  (two) times daily. What changed: Another medication with the same name was removed. Continue taking this medication, and follow the directions you see here.   famotidine 20 MG tablet Commonly known as: PEPCID Take 1 tablet (20 mg total) by mouth daily. Start taking on: March 09, 2020   furosemide 20 MG tablet Commonly known as: LASIX Take 1 tablet (20 mg total) by mouth daily as needed for fluid or edema.   gabapentin 600 MG tablet Commonly known as: NEURONTIN Take 0.5 tablets (300 mg total) by mouth 3 (three) times daily.   HYDROcodone-acetaminophen 5-325 MG tablet Commonly known as: NORCO/VICODIN Take 1 tablet by mouth 3 (three) times daily as needed (for pain).   ipratropium-albuterol 0.5-2.5 (3) MG/3ML Soln Commonly known as: DUONEB Take 3 mLs by nebulization every 6 (six) hours as needed (shortness of breath).   omeprazole 20 MG capsule Commonly known as: PRILOSEC Take 20 mg by mouth daily.   predniSONE 20 MG tablet Commonly known as: DELTASONE Take 2 tablets (40 mg total) by mouth daily with breakfast for 2 days. Start taking on: March 09, 2020   simvastatin 20 MG tablet Commonly known as: ZOCOR Take 10 mg by mouth at bedtime.   spironolactone 25 MG tablet Commonly known as: ALDACTONE Take 1 tablet (25 mg total) by mouth daily.   tamsulosin 0.4 MG Caps capsule Commonly known as: FLOMAX Take 1 capsule (0.4 mg total) by mouth at bedtime.       Disposition and  follow-up:   Glenn Brandt was discharged from [facility] in Good condition.  At the hospital follow up visit please address:  1.   Chronic episodic cough associated with presyncopal episodes Possible COPD exacerbation - Consider pulmonary function tests if they have not already been done - Consider addition of inhaled corticosteroid to regimen  Normocytic anemia - Repeat CBC - Ensure patient is up-to-date with cancer screenings  2.  Labs / imaging needed at time of  follow-up: CBC with diff, Pulmonary function testing (if hasn't already been done)  3.  Pending labs/ test needing follow-up: none  Follow-up Appointments:  Follow-up [place]. Schedule an appointment as soon as possible for a visit in 1 week(s).   Specialty: General Practice Contact information: [place] [address] [phone]        Deboraha Sprang, MD .   Specialty: Cardiology Contact information: [phone] [place] [address] [phone]               Hospital Course by problem list:  Glenn Brandt is a 59 year old man with COPD, HTN, HLD,CAD, systolicHF, CHB s/p AV pacemaker placement,prostate cancer, whowas admitted for further evaluation and work-up of chronic episodic cough associated with presyncopal episodes.  Chronic episodic cough associated with presyncopal episodes Possible COPD exacerbation Presented with report of presyncopal episodes occurring only after coughing fits. Tachycardic and tachypneic on admission, and initiated on treatment for COPD exacerbation (azithromycin, prednisone, duonebs). Had minimal improvement, noted to have significant wheezing on exam, and had additional presyncopal episode after coughing. Cardiology electrophysiology consulted and interrogated his pacemaker and determined the pacemaker not responsible for chronic cough. Treatment for COPD exacerbation continued, and treatments to further target possible etiologies of his cough were initiated - gabapentin 300 mg TID, famotidine 20 mg daily, and budesonide nebulization twice daily. There was significant improvement in his wheezing and gradual improvement in his cough, and by discharge he had decreased cough frequency. Discharged home with instructions to complete course of azithromycin and decadron as well as continue with the other therapies initiated in the hospital. Advised to avoid driving until follow-up with  pulmonologist.  Normocytic anemia, stable Hemoglobin stable ~12 during admission with MCV 84. Iron studies within normal limits - ferritin 97, iron 64, TIBC 311. No signs of bleeding. Recommend follow-up CBC.   Discharge Vitals:   BP 111/79 (BP Location: Right Arm)   Pulse 90   Temp 98.4 F (36.9 C) (Oral)   Resp 18   Ht 5\' 11"  (1.803 m)   Wt 87.1 kg   SpO2 98%   BMI 26.78 kg/m   Pertinent Labs, Studies, and Procedures:   03/05/20 CHEST - 2 VIEW  FINDINGS: Biventricular pacer from the left in unremarkable position. There is extensive artifact from EKG leads. There is no edema, consolidation, effusion, or pneumothorax.  IMPRESSION: No evidence of active disease.   Electronically Signed   By: Monte Fantasia M.D.   On: 03/05/2020 04:50  Discharge Instructions: Discharge Instructions    Call MD for:  difficulty breathing, headache or visual disturbances   Complete by: As directed    Call MD for:  extreme fatigue   Complete by: As directed    Call MD for:  persistant dizziness or light-headedness   Complete by: As directed    Call MD for:  persistant nausea and vomiting   Complete by: As directed    Call MD for:  severe uncontrolled pain   Complete by: As directed      Glenn Brandt,   It was a pleasure taking care of you. You were admitted to the hospital and treated for pre-syncopal episodes from coughing. We treated you for a COPD exacerbation and also started some medications to treat your cough. Your wheezing has improved significantly. We suspect your coughing spells are related to your lung disease, and we would like you to keep your previously scheduled follow-up appointment with your pulmonologist. The cardiologist (heart doctor) do not think your cough has anything to do with your pacemaker.  We have prescribed the following medications for you to continue after leaving the hospital: - azithromycin 250 mg daily for 2 more doses, tomorrow (10/31) and Monday  (11/1) - prednisone 40 mg daily for 2 more doses, tomorrow (10/31) and Monday (11/1) - budesonide (Pulmicort) 2 mLs (0.25 mg total) by nebulization twice daily - until told otherwise by your pulmonologist - famotidine (Pepcid) 20 mg daily starting tomorrow - gabapentin (Neurontin) 300 mg (1/2 tablet) three times daily  Continue the rest of your medications as directed.  We would like for you to follow-up with your pulmonologist as well as your primary care doctor.  Take care!  Signed: Alexandria Lodge, MD 03/08/2020, 10:46 AM   Pager: [phone]

## 2020-03-17 ENCOUNTER — Encounter (HOSPITAL_COMMUNITY): Payer: Self-pay | Admitting: Cardiology

## 2020-03-17 ENCOUNTER — Other Ambulatory Visit: Payer: Self-pay

## 2020-03-17 ENCOUNTER — Telehealth (HOSPITAL_COMMUNITY): Payer: Self-pay

## 2020-03-17 ENCOUNTER — Ambulatory Visit (HOSPITAL_COMMUNITY)
Admission: RE | Admit: 2020-03-17 | Discharge: 2020-03-17 | Disposition: A | Discharge status: Home / Self Care | Payer: No Typology Code available for payment source | Source: Ambulatory Visit | Attending: Cardiology | Admitting: Cardiology

## 2020-03-17 VITALS — BP 102/60 | HR 74 | Wt 194.6 lb

## 2020-03-17 DIAGNOSIS — Z79899 Other long term (current) drug therapy: Secondary | ICD-10-CM | POA: Diagnosis not present

## 2020-03-17 DIAGNOSIS — Z950 Presence of cardiac pacemaker: Secondary | ICD-10-CM | POA: Insufficient documentation

## 2020-03-17 DIAGNOSIS — I5022 Chronic systolic (congestive) heart failure: Secondary | ICD-10-CM

## 2020-03-17 DIAGNOSIS — Z87891 Personal history of nicotine dependence: Secondary | ICD-10-CM | POA: Insufficient documentation

## 2020-03-17 DIAGNOSIS — Z7951 Long term (current) use of inhaled steroids: Secondary | ICD-10-CM | POA: Diagnosis not present

## 2020-03-17 DIAGNOSIS — J441 Chronic obstructive pulmonary disease with (acute) exacerbation: Secondary | ICD-10-CM | POA: Diagnosis not present

## 2020-03-17 DIAGNOSIS — I428 Other cardiomyopathies: Secondary | ICD-10-CM | POA: Diagnosis not present

## 2020-03-17 DIAGNOSIS — I442 Atrioventricular block, complete: Secondary | ICD-10-CM | POA: Diagnosis not present

## 2020-03-17 DIAGNOSIS — Z8679 Personal history of other diseases of the circulatory system: Secondary | ICD-10-CM | POA: Insufficient documentation

## 2020-03-17 DIAGNOSIS — Z8546 Personal history of malignant neoplasm of prostate: Secondary | ICD-10-CM | POA: Diagnosis not present

## 2020-03-17 DIAGNOSIS — I110 Hypertensive heart disease with heart failure: Secondary | ICD-10-CM | POA: Diagnosis present

## 2020-03-17 HISTORY — DX: Heart failure, unspecified: I50.9

## 2020-03-17 LAB — BASIC METABOLIC PANEL
Anion gap: 8 (ref 5–15)
BUN: 6 mg/dL — ABNORMAL LOW (ref 8–23)
CO2: 26 mmol/L (ref 22–32)
Calcium: 9.4 mg/dL (ref 8.9–10.3)
Chloride: 103 mmol/L (ref 98–111)
Creatinine, Ser: 1.09 mg/dL (ref 0.61–1.24)
GFR, Estimated: >60 mL/min (ref >60)
Glucose, Bld: 122 mg/dL — ABNORMAL HIGH (ref 70–99)
Potassium: 4.4 mmol/L (ref 3.5–5.1)
Sodium: 137 mmol/L (ref 135–145)

## 2020-03-17 LAB — BRAIN NATRIURETIC PEPTIDE: B Natriuretic Peptide: 104.7 pg/mL — ABNORMAL HIGH (ref 0.0–100.0)

## 2020-03-17 MED ORDER — BISOPROLOL FUMARATE 5 MG PO TABS
7.5000 mg | ORAL_TABLET | Freq: Every day | ORAL | 5 refills | Status: DC
Start: 2020-03-17 — End: 2020-05-05

## 2020-03-17 NOTE — Patient Instructions (Addendum)
INCREASE Bisoprolol to 7.5mg  (1.5 tabs) daily   Labs today We will only contact you if something comes back abnormal or we need to make some changes. Otherwise no news is good news!   Your physician has requested that you have an echocardiogram. Echocardiography is a painless test that uses sound waves to create images of your heart. It provides your doctor with information about the size and shape of your heart and how well your heart's chambers and valves are working. This procedure takes approximately one hour. There are no restrictions for this procedure.   Your physician recommends that you schedule a follow-up appointment in: 6 weeks with Dr Aundra Dubin and an echo.  Please call office at [phone] option 2 if you have any questions or concerns.   At the [facility], you and your health needs are our priority. As part of our continuing mission to provide you with exceptional heart care, we have created designated Provider Care Teams. These Care Teams include your primary Cardiologist (physician) and Advanced Practice Providers (APPs- Physician Assistants and Nurse Practitioners) who all work together to provide you with the care you need, when you need it.   You may see any of the following providers on your designated Care Team at your next follow up: [name] Dr Glori Bickers . Dr Loralie Champagne . Darrick Grinder, NP . Lyda Jester, PA . Audry Riles, PharmD   Please be sure to bring in all your medications bottles to every appointment.

## 2020-03-17 NOTE — Progress Notes (Signed)
Advanced Heart Failure Clinic Note   PCP: [place] Cardiologist: Dr. Aundra Dubin EP- Dr. Caryl Comes   HPI: 59 y.o.with history of complete heart block s/p dual chamber Medtronic PPM 2/21, HTN, prior smoking/?COPD. He was admitted in 2/21 with CHB and syncope, echo in 2/21 with EF 60-65%. PPM was placed. Initially did ok after PPM.   In June 2021, he started to develop dyspnea and wheezing + cough. He went to the ER, thought to have COPD exacerbation and started on albuterol and steroids. He was back in the ER 1 week later with wheezing and hypoxia, was briefly on bipap and given antibiotics + a prolonged steroid taper. He returned to the ED on 11/16/19 this time with chest pain radiating to his back. A CTchestdissection protocol was performed that was notable for "new cardiomegaly" but not aortic pathology. There was no effusion on this CT scan. He was treated with dilaudid and toradol and discharged home pain free.  Shortly after, he developed cough and new orthopnea. He then developed chest pain. On 11/24/19, he went back to the ER and was admitted. He was hypertensive, HS-TnI not elevated. CTA chest showed no PE but there was a new moderate pericardial effusion. Chest pain was pleuritic. CRP elevated 20, ESR 53. Started on colchicine and prednisone continued in setting of suspected myopericarditis. COVID negative, flu negative, RSV negative. On azithromycin/ceftriaxone for possible PNA. Mildly elevated PCT at 0.33.  Echo was done, showing LV EF <20%, RV moderately decreased systolic function, small-moderate pericardial effusion surrounding heart w/o evidence for tamponade.  RHC/LHC showed no coronary disease, normal filling pressures, normal cardiac output.  Cardiac MRI did not show evidence for myocarditis (did show evidence for pericarditis). There was no evidence for cardiac sarcoidosis. Given no CAD on cath and no LGE on MRI, fall in EF may well be due to chronic RV pacing.  Patient was markedly dyssynchronous.Still not clear why the patient developed CHB. EP was consulted and recommended repeating echo in 4-6 weeks. If no improvement will need CRT upgrade.He was placed on GDMT for systolic HF, Entresto, Spiro and bisoprolol. Lasix 20 mg daily for volume control. He was continued on colchicine 0.6 mg bid for pericarditis, to be continued x 3 months.  Repeat echo in 9/21 showed EF 20-25% with moderate LV dilation.  He was seen by Dr. Caryl Comes, had Medtronic CRT-D upgrade in 10/21.    Later in 10/21, he was admitted for COPD exacerbation and was treated with nebs and steroids.  Now on Spiriva and albuterol.  He stopped smoking in 2020.   Patient returns for followup of CHF.  He says that he has been doing well recently.  No dyspnea walking up stairs or walking on flat ground.  No lightheadedness. No chest pain.  No orthopnea/PND. Weight is up about 10 lbs.   Labs (10/21): K 3.8, creatinine 1.07, HIV negative  ECG (personally reviewed): NSR, BiV paced  Review of Systems: All systems reviewed and negative except as noted in HPI.   PMH: 1. Hyperlipidemia 2. HTN 3. Prostate cancer 4. Pericarditis in 7/21.  5. Complete heart block: 2/21.  MDT PPM placed, later upgraded to CRT.  No evidence for sarcoidosis on cardiac MRI.  6. Chronic systolic CHF: Nonischemic cardiomyopathy.  - Cardiac MRI (7/21): EF 20% with dyssynchrony, moderate RV dysfunction, no LGE, small pericardial effusion.  - Echo (9/21): EF 20-25% with septal-lateral dyssynchrony, moderate LV dilation, normal RV.  - Upgrade to MDT CRT-P device 10/21.  Current Outpatient Medications  Medication Sig Dispense Refill  . acetaminophen (TYLENOL) 500 MG tablet Take 1,000 mg by mouth every 6 (six) hours as needed for mild pain or headache.     . albuterol (PROVENTIL HFA;VENTOLIN HFA) 108 (90 BASE) MCG/ACT inhaler Inhale 2 puffs into the lungs 4 (four) times daily as needed for wheezing or shortness of breath.      . bisoprolol (ZEBETA) 5 MG tablet Take 1.5 tablets (7.5 mg total) by mouth daily. 45 tablet 5  . budesonide (PULMICORT) 0.25 MG/2ML nebulizer solution Take 2 mLs (0.25 mg total) by nebulization 2 (two) times daily. 120 mL 0  . colchicine 0.6 MG tablet Take 1 tablet (0.6 mg total) by mouth 2 (two) times daily. 60 tablet 2  . cyclobenzaprine (FLEXERIL) 10 MG tablet Take 10 mg by mouth at bedtime as needed for muscle spasms.     . diclofenac sodium (VOLTAREN) 1 % GEL Apply 4 g topically 4 (four) times daily as needed (pain).     [name] empagliflozin (JARDIANCE) 25 MG TABS tablet Take 12.5 mg (0.5 tablets) by mouth  daily 45 tablet 3  . famotidine (PEPCID) 20 MG tablet Take 1 tablet (20 mg total) by mouth daily. 30 tablet 0  . furosemide (LASIX) 20 MG tablet Take 1 tablet (20 mg total) by mouth daily as needed for fluid or edema. 30 tablet 5  . gabapentin (NEURONTIN) 600 MG tablet Take 0.5 tablets (300 mg total) by mouth 3 (three) times daily. 45 tablet 0  . HYDROcodone-acetaminophen (NORCO/VICODIN) 5-325 MG tablet Take 1 tablet by mouth 3 (three) times daily as needed (for pain).     [name] ipratropium-albuterol (DUONEB) 0.5-2.5 (3) MG/3ML SOLN Take 3 mLs by nebulization every 6 (six) hours as needed (shortness of breath).     . Naphazoline HCl (CLEAR EYES OP) Place 1 drop into both eyes daily as needed (allergies).    [name] omeprazole (PRILOSEC) 20 MG capsule Take 20 mg by mouth daily.    . sacubitril-valsartan (ENTRESTO) 49-51 MG Take 0.5 tablets by mouth 2 (two) times daily.    . simvastatin (ZOCOR) 20 MG tablet Take 10 mg by mouth at bedtime.    [name] spironolactone (ALDACTONE) 25 MG tablet Take 1 tablet (25 mg total) by mouth daily. 30 tablet 5  . tamsulosin (FLOMAX) 0.4 MG CAPS capsule Take 1 capsule (0.4 mg total) by mouth at bedtime. 30 capsule 0   No current facility-administered medications for this encounter.    No Known Allergies    Social History   Socioeconomic History  . Marital status:  Married    Spouse name: Not on file  . Number of children: Not on file  . Years of education: Not on file  . Highest education level: Not on file  Occupational History  . Not on file  Tobacco Use  . Smoking status: Former Smoker    Types: Cigarettes  . Smokeless tobacco: Never Used  Vaping Use  . Vaping Use: Never used  Substance and Sexual Activity  . Alcohol use: Yes    Alcohol/week: 6.0 standard drinks    Types: 6 Cans of beer per week    Comment: socially  . Drug use: No  . Sexual activity: Not on file  Other Topics Concern  . Not on file  Social History Narrative  . Not on file   Social Determinants of Health   Financial Resource Strain:   . Difficulty of Paying Living Expenses: Not on file  Food  Insecurity:   . Worried About Charity fundraiser in the Last Year: Not on file  . Ran Out of Food in the Last Year: Not on file  Transportation Needs:   . Lack of Transportation (Medical): Not on file  . Lack of Transportation (Non-Medical): Not on file  Physical Activity:   . Days of Exercise per Week: Not on file  . Minutes of Exercise per Session: Not on file  Stress:   . Feeling of Stress : Not on file  Social Connections:   . Frequency of Communication with Friends and Family: Not on file  . Frequency of Social Gatherings with Friends and Family: Not on file  . Attends Religious Services: Not on file  . Active Member of Clubs or Organizations: Not on file  . Attends Archivist Meetings: Not on file  . Marital Status: Not on file  Intimate Partner Violence:   . Fear of Current or Ex-Partner: Not on file  . Emotionally Abused: Not on file  . Physically Abused: Not on file  . Sexually Abused: Not on file      Family History  Problem Relation Age of Onset  . Prostate cancer Neg Hx     Vitals:   03/17/20 1035  BP: 102/60  Pulse: 74  SpO2: 98%  Weight: 88.3 kg (194 lb 9.6 oz)     PHYSICAL EXAM: General: NAD Neck: No JVD, no thyromegaly or  thyroid nodule.  Lungs: Clear to auscultation bilaterally with normal respiratory effort. CV: Nondisplaced PMI.  Heart regular S1/S2, no S3/S4, no murmur.  No peripheral edema.  No carotid bruit.  Normal pedal pulses.  Abdomen: Soft, nontender, no hepatosplenomegaly, no distention.  Skin: Intact without lesions or rashes.  Neurologic: Alert and oriented x 3.  Psych: Normal affect. Extremities: No clubbing or cyanosis.  HEENT: Normal.   ASSESSMENT & PLAN:  1. Chronic Systolic Heart Failure: Echo in 2/21 prior to PPM placement showed EF 60-65%. Echo 7/21 withLV EF < 20%, RV moderately decreased systolic function, small-moderate pericardial effusion surrounding heart w/o evidence for tamponade. In 7/21, patient had symptoms concerning for viral myopericarditis with pericardial effusion and pleuritic chest pain =>however, troponin not significantly elevated, pointing away from a myocarditis component. RHC/LHC showed no coronary disease, normal filling pressures, normal cardiac output. Nonischemic cardiomyopathy, but cause uncertain.Cardiac MRI did not show evidence for myocarditis (did show evidence for pericarditis). There was no evidence for cardiac sarcoidosis. Given no CAD on cath and no LGE on MRI, fall in EF may well be due to chronic RV pacing. Patient was markedly dyssynchronous =>with repeat echo in 9/21 showing EF 20-25%, he was upgraded to MDT CRT-P.  He is doing well today, NYHA class II and no volume overload.  - Continue Entresto 49/51 mg bid, BP soft for Entresto titration.  - Continue Spironolactone 25 mg daily  - Continue empagliflozin 12.5 mg daily - Only uses Lasix prn - Increase bisoprolol to 7.5 mg daily  - BMET/BNP today.  - Followup 6 wks with echo to see if EF improves post-CRT upgrade.  2. Pericarditis: No further chest pain.  He has been on colchicine x 3 months.  - Can stop colchicine.   3. H/o CHB: s/p PPM 2/21, upgraded to CRT in 10/21.  4. COPD: Quit smoking  in 2020.  Recent COPD exacerbation.   F/u w/ Dr. Aundra Dubin in 6 weeks with echo.   Loralie Champagne, MD 03/17/20

## 2020-03-17 NOTE — Telephone Encounter (Signed)
Called patient to let him know to stop Colchicine 0.6mg  per Dr Aundra Dubin. Pt aware of same and verbalized understanding.

## 2020-03-18 ENCOUNTER — Ambulatory Visit (INDEPENDENT_AMBULATORY_CARE_PROVIDER_SITE_OTHER): Payer: No Typology Code available for payment source | Admitting: Emergency Medicine

## 2020-03-18 DIAGNOSIS — I442 Atrioventricular block, complete: Secondary | ICD-10-CM

## 2020-03-18 LAB — CUP PACEART INCLINIC DEVICE CHECK
Battery Remaining Longevity: 120 mo
Battery Voltage: 3.09 V
Brady Statistic AP VP Percent: 0.05 %
Brady Statistic AP VS Percent: 0.01 %
Brady Statistic AS VP Percent: 99.94 %
Brady Statistic AS VS Percent: 0 %
Brady Statistic RA Percent Paced: 0.05 %
Brady Statistic RV Percent Paced: 99.99 %
Date Time Interrogation Session: 20211109093908
Implantable Lead Implant Date: 20210222
Implantable Lead Implant Date: 20210222
Implantable Lead Implant Date: 20211011
Implantable Lead Location: 753858
Implantable Lead Location: 753859
Implantable Lead Location: 753860
Implantable Lead Model: 5076
Implantable Lead Model: 5076
Implantable Pulse Generator Implant Date: 20211011
Lead Channel Impedance Value: 1007 Ohm
Lead Channel Impedance Value: 1064 Ohm
Lead Channel Impedance Value: 1159 Ohm
Lead Channel Impedance Value: 1197 Ohm
Lead Channel Impedance Value: 1292 Ohm
Lead Channel Impedance Value: 323 Ohm
Lead Channel Impedance Value: 323 Ohm
Lead Channel Impedance Value: 380 Ohm
Lead Channel Impedance Value: 475 Ohm
Lead Channel Impedance Value: 570 Ohm
Lead Channel Impedance Value: 627 Ohm
Lead Channel Impedance Value: 703 Ohm
Lead Channel Impedance Value: 836 Ohm
Lead Channel Impedance Value: 931 Ohm
Lead Channel Pacing Threshold Amplitude: 0.75 V
Lead Channel Pacing Threshold Amplitude: 0.75 V
Lead Channel Pacing Threshold Amplitude: 1 V
Lead Channel Pacing Threshold Pulse Width: 0.4 ms
Lead Channel Pacing Threshold Pulse Width: 0.4 ms
Lead Channel Pacing Threshold Pulse Width: 0.4 ms
Lead Channel Sensing Intrinsic Amplitude: 0.8 mV
Lead Channel Setting Pacing Amplitude: 1.75 V
Lead Channel Setting Pacing Amplitude: 1.75 V
Lead Channel Setting Pacing Amplitude: 2 V
Lead Channel Setting Pacing Pulse Width: 0.4 ms
Lead Channel Setting Pacing Pulse Width: 0.4 ms
Lead Channel Setting Sensing Sensitivity: 0.9 mV

## 2020-03-18 NOTE — Patient Instructions (Signed)
[employer] will call you back to further assist once the system has updated your information. Please call the [facility] [phone] once you have your app working or if you have further issues.

## 2020-03-18 NOTE — Progress Notes (Signed)
Pacemaker check in clinic. Normal device function. Thresholds, sensing, impedances consistent with previous measurements. Device programmed to maximize longevity. LV threshold stable today at 1.0 V @ 0.4 ms. Vector Express ran and shows vector is appropriate. No changes made to session. [employer] rep. present.  No mode switch or high ventricular rates noted. Device programmed at appropriate safety margins. Histogram distribution appropriate for patient activity level. Estimated longevity 9.8 years. Patient enrolled in remote follow-up 03/31/20. Patient education completed.   Patients name app on phone is not working. Name was incorrect so I corrected in device to name.  Called [employer], serial number provided, states they did not have his updated information on file. They have it now and will call patient back after it is processed on their end. Patient will call DC after his app is installed to make sure everything is up to date on our end.

## 2020-05-05 ENCOUNTER — Other Ambulatory Visit: Payer: Self-pay

## 2020-05-05 ENCOUNTER — Encounter (HOSPITAL_COMMUNITY): Payer: Self-pay | Admitting: Cardiology

## 2020-05-05 ENCOUNTER — Ambulatory Visit (HOSPITAL_BASED_OUTPATIENT_CLINIC_OR_DEPARTMENT_OTHER)
Admission: RE | Admit: 2020-05-05 | Discharge: 2020-05-05 | Disposition: A | Discharge status: Home / Self Care | Payer: No Typology Code available for payment source | Source: Ambulatory Visit | Attending: Cardiology | Admitting: Cardiology

## 2020-05-05 ENCOUNTER — Ambulatory Visit (HOSPITAL_COMMUNITY)
Admission: RE | Admit: 2020-05-05 | Discharge: 2020-05-05 | Disposition: A | Discharge status: Home / Self Care | Payer: No Typology Code available for payment source | Source: Ambulatory Visit | Attending: Cardiology | Admitting: Cardiology

## 2020-05-05 VITALS — BP 110/86 | HR 101 | Wt 197.0 lb

## 2020-05-05 DIAGNOSIS — Z87891 Personal history of nicotine dependence: Secondary | ICD-10-CM | POA: Insufficient documentation

## 2020-05-05 DIAGNOSIS — I5022 Chronic systolic (congestive) heart failure: Secondary | ICD-10-CM | POA: Insufficient documentation

## 2020-05-05 DIAGNOSIS — I110 Hypertensive heart disease with heart failure: Secondary | ICD-10-CM | POA: Diagnosis present

## 2020-05-05 DIAGNOSIS — I442 Atrioventricular block, complete: Secondary | ICD-10-CM

## 2020-05-05 LAB — ECHOCARDIOGRAM COMPLETE
Area-P 1/2: 6.65 cm2
Calc EF: 43.2 %
S' Lateral: 4.3 cm
Single Plane A2C EF: 43.9 %
Single Plane A4C EF: 36.8 %

## 2020-05-05 MED ORDER — BISOPROLOL FUMARATE 5 MG PO TABS
7.5000 mg | ORAL_TABLET | Freq: Every day | ORAL | 5 refills | Status: DC
Start: 2020-05-05 — End: 2020-08-26

## 2020-05-05 NOTE — Progress Notes (Signed)
Advanced Heart Failure Clinic Note   PCP: [facility] Cardiologist: Dr. Aundra Dubin EP- Dr. Caryl Comes   HPI: 59 y.o.with history of complete heart block s/p dual chamber Medtronic PPM 2/21, HTN, prior smoking/?COPD. He was admitted in 2/21 with CHB and syncope, echo in 2/21 with EF 60-65%. PPM was placed. Initially did ok after PPM.   In June 2021, he started to develop dyspnea and wheezing + cough. He went to the ER, thought to have COPD exacerbation and started on albuterol and steroids. He was back in the ER 1 week later with wheezing and hypoxia, was briefly on bipap and given antibiotics + a prolonged steroid taper. He returned to the ED on 11/16/19 this time with chest pain radiating to his back. A CTchestdissection protocol was performed that was notable for "new cardiomegaly" but not aortic pathology. There was no effusion on this CT scan. He was treated with dilaudid and toradol and discharged home pain free.  Shortly after, he developed cough and new orthopnea. He then developed chest pain. On 11/24/19, he went back to the ER and was admitted. He was hypertensive, HS-TnI not elevated. CTA chest showed no PE but there was a new moderate pericardial effusion. Chest pain was pleuritic. CRP elevated 20, ESR 53. Started on colchicine and prednisone continued in setting of suspected myopericarditis. COVID negative, flu negative, RSV negative. On azithromycin/ceftriaxone for possible PNA. Mildly elevated PCT at 0.33.  Echo was done, showing LV EF <20%, RV moderately decreased systolic function, small-moderate pericardial effusion surrounding heart w/o evidence for tamponade.  RHC/LHC showed no coronary disease, normal filling pressures, normal cardiac output.  Cardiac MRI did not show evidence for myocarditis (did show evidence for pericarditis). There was no evidence for cardiac sarcoidosis. Given no CAD on cath and no LGE on MRI, fall in EF may well be due to chronic RV pacing.  Patient was markedly dyssynchronous.Still not clear why the patient developed CHB. EP was consulted and recommended repeating echo in 4-6 weeks. If no improvement will need CRT upgrade.He was placed on GDMT for systolic HF, Entresto, Spiro and bisoprolol. Lasix 20 mg daily for volume control. He was continued on colchicine 0.6 mg bid for pericarditis, to be continued x 3 months.  Repeat echo in 9/21 showed EF 20-25% with moderate LV dilation.  He was seen by Dr. Caryl Comes, had Medtronic CRT-D upgrade in 10/21.    Later in 10/21, he was admitted for COPD exacerbation and was treated with nebs and steroids.  Now on Spiriva and albuterol.  He stopped smoking in 2020.   Today he returns for HF follow up.Overall feeling fine. Denies SOB/PND/Orthopnea. Appetite ok. No fever or chills. Weight at home  pounds. Taking all medications but he ran out of bisoprolol 12/24. Says he ordered from [place] but they havent arrived.   Medtronic device interrogation: Activity 6 hours. No VT/ No A fib. 100% Biv Pacing.   Labs (10/21): K 3.8, creatinine 1.07, HIV negative   Review of Systems: All systems reviewed and negative except as noted in HPI.   PMH: 1. Hyperlipidemia 2. HTN 3. Prostate cancer 4. Pericarditis in 7/21.  5. Complete heart block: 2/21.  MDT PPM placed, later upgraded to CRT.  No evidence for sarcoidosis on cardiac MRI.  6. Chronic systolic CHF: Nonischemic cardiomyopathy.  - Cardiac MRI (7/21): EF 20% with dyssynchrony, moderate RV dysfunction, no LGE, small pericardial effusion.  - Echo (9/21): EF 20-25% with septal-lateral dyssynchrony, moderate LV dilation, normal RV.  -  Upgrade to MDT CRT-P device 10/21.  - Echo (12/21): EF 35%, mild LV dilation, normal RV function.    Current Outpatient Medications  Medication Sig Dispense Refill  . acetaminophen (TYLENOL) 500 MG tablet Take 1,000 mg by mouth every 6 (six) hours as needed for mild pain or headache.     . albuterol (PROVENTIL HFA;VENTOLIN  HFA) 108 (90 BASE) MCG/ACT inhaler Inhale 2 puffs into the lungs 4 (four) times daily as needed for wheezing or shortness of breath.     . bisoprolol (ZEBETA) 5 MG tablet Take 1.5 tablets (7.5 mg total) by mouth daily. 45 tablet 5  . budesonide (PULMICORT) 0.25 MG/2ML nebulizer solution Take 2 mLs (0.25 mg total) by nebulization 2 (two) times daily. 120 mL 0  . cyclobenzaprine (FLEXERIL) 10 MG tablet Take 10 mg by mouth at bedtime as needed for muscle spasms.     . diclofenac sodium (VOLTAREN) 1 % GEL Apply 4 g topically 4 (four) times daily as needed (pain).     [name] empagliflozin (JARDIANCE) 25 MG TABS tablet Take 12.5 mg (0.5 tablets) by mouth  daily 45 tablet 3  . furosemide (LASIX) 20 MG tablet Take 1 tablet (20 mg total) by mouth daily as needed for fluid or edema. 30 tablet 5  . HYDROcodone-acetaminophen (NORCO/VICODIN) 5-325 MG tablet Take 1 tablet by mouth 3 (three) times daily as needed (for pain).     . Naphazoline HCl (CLEAR EYES OP) Place 1 drop into both eyes daily as needed (allergies).    [name] omeprazole (PRILOSEC) 20 MG capsule Take 20 mg by mouth daily.    . sacubitril-valsartan (ENTRESTO) 49-51 MG Take 0.5 tablets by mouth 2 (two) times daily.    . simvastatin (ZOCOR) 20 MG tablet Take 10 mg by mouth at bedtime.    [name] spironolactone (ALDACTONE) 25 MG tablet Take 1 tablet (25 mg total) by mouth daily. 30 tablet 5  . tamsulosin (FLOMAX) 0.4 MG CAPS capsule Take 1 capsule (0.4 mg total) by mouth at bedtime. 30 capsule 0   No current facility-administered medications for this encounter.    No Known Allergies    Social History   Socioeconomic History  . Marital status: Married    Spouse name: Not on file  . Number of children: Not on file  . Years of education: Not on file  . Highest education level: Not on file  Occupational History  . Not on file  Tobacco Use  . Smoking status: Former Smoker    Types: Cigarettes  . Smokeless tobacco: Never Used  Vaping Use  . Vaping Use:  Never used  Substance and Sexual Activity  . Alcohol use: Yes    Alcohol/week: 6.0 standard drinks    Types: 6 Cans of beer per week    Comment: socially  . Drug use: No  . Sexual activity: Not on file  Other Topics Concern  . Not on file  Social History Narrative  . Not on file   Social Determinants of Health   Financial Resource Strain: Not on file  Food Insecurity: Not on file  Transportation Needs: Not on file  Physical Activity: Not on file  Stress: Not on file  Social Connections: Not on file  Intimate Partner Violence: Not on file      Family History  Problem Relation Age of Onset  . Prostate cancer Neg Hx     Vitals:   05/05/20 0910  BP: 110/86  Pulse: (!) 101  SpO2: 95%  Weight:  89.4 kg (197 lb)   Wt Readings from Last 3 Encounters:  05/05/20 89.4 kg (197 lb)  03/17/20 88.3 kg (194 lb 9.6 oz)  03/06/20 87.1 kg (192 lb 0.3 oz)     PHYSICAL EXAM: General:  Well appearing. No resp difficulty HEENT: normal Neck: supple. no JVD. Carotids 2+ bilat; no bruits. No lymphadenopathy or thryomegaly appreciated. Cor: PMI nondisplaced. Regular rate & rhythm. No rubs, gallops or murmurs. Lungs: clear Abdomen: soft, nontender, nondistended. No hepatosplenomegaly. No bruits or masses. Good bowel sounds. Extremities: no cyanosis, clubbing, rash, edema Neuro: alert & orientedx3, cranial nerves grossly intact. moves all 4 extremities w/o difficulty. Affect pleasant   EKG: BiV pacing 102 bopm    ASSESSMENT & PLAN:  1. Chronic Systolic Heart Failure: Echo in 2/21 prior to PPM placement showed EF 60-65%. Echo 7/21 withLV EF < 20%, RV moderately decreased systolic function, small-moderate pericardial effusion surrounding heart w/o evidence for tamponade. In 7/21, patient had symptoms concerning for viral myopericarditis with pericardial effusion and pleuritic chest pain =>however, troponin not significantly elevated, pointing away from a myocarditis component. RHC/LHC  showed no coronary disease, normal filling pressures, normal cardiac output. Nonischemic cardiomyopathy, but cause uncertain.Cardiac MRI did not show evidence for myocarditis (did show evidence for pericarditis). There was no evidence for cardiac sarcoidosis. Given no CAD on cath and no LGE on MRI, fall in EF may well be due to chronic RV pacing. Patient was markedly dyssynchronous =>with repeat echo in 9/21 showing EF 20-25%, he was upgraded to MDT CRT-P.  Echo today 35%.  -NYHA I .  Volume status stable. Can use lasix as needed - Continue Entresto 49/51 mg bid, BP soft for Entresto titration.  - Continue Spironolactone 25 mg daily  - Continue empagliflozin 12.5 mg daily -Continue bisoprolol to 7.5 mg daily . He has reordered but he waiting on delivery from the [place].  Echo today  2. Pericarditis: No further chest pain. Completed colchicine  3. H/o CHB: s/p PPM 2/21, upgraded to CRT in 10/21.  4. COPD: Quit smoking in 2020.  Recent COPD exacerbation.   Follow up in 3 months.   Darrick Grinder, NP 05/05/20   Patient seen with NP, agree with the above note.   He has been doing well recently, had upgrade to MDT CRT-D device.  No complaints, NYHA class II.  Echo was done today and reviewed, EF 35% (some improvement).   General: NAD Neck: No JVD, no thyromegaly or thyroid nodule.  Lungs: Clear to auscultation bilaterally with normal respiratory effort. CV: Nondisplaced PMI.  Heart regular S1/S2, no S3/S4, no murmur.  No peripheral edema.  No carotid bruit.  Normal pedal pulses.  Abdomen: Soft, nontender, no hepatosplenomegaly, no distention.  Skin: Intact without lesions or rashes.  Neurologic: Alert and oriented x 3.  Psych: Normal affect. Extremities: No clubbing or cyanosis.  HEENT: Normal.   He has been off bisoprolol, I will have him restart this today (7.5 mg daily).  Continue other meds as currently taking, some improvement since CRT upgrade based on echo today (EF 35%).  He will need  to followup in 3 months.   Loralie Champagne 05/05/2020

## 2020-05-05 NOTE — Patient Instructions (Addendum)
Your physician recommends that you schedule a follow-up appointment in: 3 months  If you have any questions or concerns before your next appointment please send us a message through mychart or call our office at [phone].    TO LEAVE A MESSAGE FOR THE NURSE SELECT OPTION 2, PLEASE LEAVE A MESSAGE INCLUDING: . YOUR NAME . DATE OF BIRTH . CALL BACK NUMBER . REASON FOR CALL**this is important as we prioritize the call backs  YOU WILL RECEIVE A CALL BACK THE SAME DAY AS LONG AS YOU CALL BEFORE 4:00 PM  

## 2020-05-05 NOTE — Progress Notes (Signed)
  Echocardiogram 2D Echocardiogram has been performed.  Stark Bray Swaim 05/05/2020, 8:30 AM

## 2020-05-25 DIAGNOSIS — Z950 Presence of cardiac pacemaker: Secondary | ICD-10-CM | POA: Insufficient documentation

## 2020-05-25 DIAGNOSIS — I428 Other cardiomyopathies: Secondary | ICD-10-CM | POA: Insufficient documentation

## 2020-05-26 ENCOUNTER — Telehealth (INDEPENDENT_AMBULATORY_CARE_PROVIDER_SITE_OTHER): Payer: No Typology Code available for payment source | Admitting: Internal Medicine

## 2020-05-26 ENCOUNTER — Telehealth: Payer: Self-pay | Admitting: *Deleted

## 2020-05-26 ENCOUNTER — Encounter: Payer: Self-pay | Admitting: Internal Medicine

## 2020-05-26 ENCOUNTER — Other Ambulatory Visit: Payer: Self-pay

## 2020-05-26 VITALS — BP 118/77 | HR 70 | Ht 71.0 in | Wt 197.0 lb

## 2020-05-26 DIAGNOSIS — I428 Other cardiomyopathies: Secondary | ICD-10-CM | POA: Diagnosis not present

## 2020-05-26 DIAGNOSIS — R55 Syncope and collapse: Secondary | ICD-10-CM | POA: Diagnosis not present

## 2020-05-26 DIAGNOSIS — I442 Atrioventricular block, complete: Secondary | ICD-10-CM | POA: Diagnosis not present

## 2020-05-26 DIAGNOSIS — I5022 Chronic systolic (congestive) heart failure: Secondary | ICD-10-CM | POA: Diagnosis not present

## 2020-05-26 DIAGNOSIS — Z950 Presence of cardiac pacemaker: Secondary | ICD-10-CM

## 2020-05-26 NOTE — Patient Instructions (Signed)
Medication Instructions:  Your physician recommends that you continue on your current medications as directed. Please refer to the Current Medication list given to you today.  *If you need a refill on your cardiac medications before your next appointment, please call your pharmacy*   Lab Work: None ordered.  If you have labs (blood work) drawn today and your tests are completely normal, you will receive your results only by: [name] MyChart Message (if you have MyChart) OR . A paper copy in the mail If you have any lab test that is abnormal or we need to change your treatment, we will call you to review the results.   Testing/Procedures: None ordered.   Follow-Up: At [facility], you and your health needs are our priority.  As part of our continuing mission to provide you with exceptional heart care, we have created designated Provider Care Teams.  These Care Teams include your primary Cardiologist (physician) and Advanced Practice Providers (APPs -  Physician Assistants and Nurse Practitioners) who all work together to provide you with the care you need, when you need it.  We recommend signing up for the patient portal called "MyChart".  Sign up information is provided on this After Visit Summary.  MyChart is used to connect with patients for Virtual Visits (Telemedicine).  Patients are able to view lab/test results, encounter notes, upcoming appointments, etc.  Non-urgent messages can be sent to your provider as well.   To learn more about what you can do with MyChart, go to [email or web address].    Your next appointment:   08/18/2020 at 3pm with Dr Caryl Comes
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
## 2020-05-26 NOTE — Telephone Encounter (Signed)
  Patient Consent for Virtual Visit         Glenn Brandt has provided verbal consent on 05/26/2020 for a virtual visit (video or telephone).   CONSENT FOR VIRTUAL VISIT FOR:  Glenn Brandt  By participating in this virtual visit I agree to the following:  I hereby voluntarily request, consent and authorize [facility] and its employed or contracted physicians, physician assistants, nurse practitioners or other licensed health care professionals (the Practitioner), to provide me with telemedicine health care services (the "Services") as deemed necessary by the treating Practitioner. I acknowledge and consent to receive the Services by the Practitioner via telemedicine. I understand that the telemedicine visit will involve communicating with the Practitioner through live audiovisual communication technology and the disclosure of certain medical information by electronic transmission. I acknowledge that I have been given the opportunity to request an in-person assessment or other available alternative prior to the telemedicine visit and am voluntarily participating in the telemedicine visit.  I understand that I have the right to withhold or withdraw my consent to the use of telemedicine in the course of my care at any time, without affecting my right to future care or treatment, and that the Practitioner or I may terminate the telemedicine visit at any time. I understand that I have the right to inspect all information obtained and/or recorded in the course of the telemedicine visit and may receive copies of available information for a reasonable fee.  I understand that some of the potential risks of receiviat a copy of this consent can be made available to me via my patient portal ([facility]), or I can request a printed copy by calling the office of [facility].    I understand that my insurance will be billed for this visit.   I have read or had this consent read to me. I  understand the contents of this consent, which adequately explains the benefits and risks of the Services being provided via telemedicine. ng the Services via telemedicine include:  [name] Delay or interruption in medical evaluation due to technological equipment failure or disruption; . Information transmitted may not be sufficient (e.g. poor resolution of images) to allow for appropriate medical decision making by the Practitioner; and/or  . In rare instances, security protocols could fail, causing a breach of personal health information.  . Furthermore, I acknowledge that it is my responsibility to provide information about my medical history, conditions and care that is complete and accurate to the best of my ability. I acknowledge that Practitioner's advice, recommendations, and/or decision may be based on factors not within their control, such as incomplete or inaccurate data provided by me or distortions of diagnostic images or specimens that may result from electronic transmissions. I understand that the practice of medicine is not an exact science and that Practitioner makes no warranties or guarantees regarding treatment outcomes. I acknowledge th . I have been provided ample opportunity to ask questions regarding this consent and the Services and have had my questions answered to my satisfaction. . I give my informed consent for the services to be provided through the use of telemedicine in my medical care

## 2020-06-06 ENCOUNTER — Ambulatory Visit (INDEPENDENT_AMBULATORY_CARE_PROVIDER_SITE_OTHER): Payer: No Typology Code available for payment source

## 2020-06-06 DIAGNOSIS — I428 Other cardiomyopathies: Secondary | ICD-10-CM

## 2020-06-06 LAB — CUP PACEART REMOTE DEVICE CHECK
Battery Remaining Longevity: 106 mo
Battery Voltage: 3.05 V
Brady Statistic AP VP Percent: 0.76 %
Brady Statistic AP VS Percent: 0.01 %
Brady Statistic AS VP Percent: 99.22 %
Brady Statistic AS VS Percent: 0.01 %
Brady Statistic RA Percent Paced: 0.77 %
Brady Statistic RV Percent Paced: 99.98 %
Date Time Interrogation Session: 20220127220601
Implantable Lead Implant Date: 20210222
Implantable Lead Implant Date: 20210222
Implantable Lead Implant Date: 20211011
Implantable Lead Location: 753858
Implantable Lead Location: 753859
Implantable Lead Location: 753860
Implantable Lead Model: 5076
Implantable Lead Model: 5076
Implantable Pulse Generator Implant Date: 20211011
Lead Channel Impedance Value: 1064 Ohm
Lead Channel Impedance Value: 1140 Ohm
Lead Channel Impedance Value: 1235 Ohm
Lead Channel Impedance Value: 1235 Ohm
Lead Channel Impedance Value: 1330 Ohm
Lead Channel Impedance Value: 323 Ohm
Lead Channel Impedance Value: 323 Ohm
Lead Channel Impedance Value: 380 Ohm
Lead Channel Impedance Value: 494 Ohm
Lead Channel Impedance Value: 513 Ohm
Lead Channel Impedance Value: 646 Ohm
Lead Channel Impedance Value: 684 Ohm
Lead Channel Impedance Value: 760 Ohm
Lead Channel Impedance Value: 988 Ohm
Lead Channel Pacing Threshold Amplitude: 0.625 V
Lead Channel Pacing Threshold Amplitude: 0.75 V
Lead Channel Pacing Threshold Amplitude: 1.625 V
Lead Channel Pacing Threshold Pulse Width: 0.4 ms
Lead Channel Pacing Threshold Pulse Width: 0.4 ms
Lead Channel Pacing Threshold Pulse Width: 0.4 ms
Lead Channel Sensing Intrinsic Amplitude: 1.75 mV
Lead Channel Sensing Intrinsic Amplitude: 1.75 mV
Lead Channel Sensing Intrinsic Amplitude: 3.75 mV
Lead Channel Setting Pacing Amplitude: 1.5 V
Lead Channel Setting Pacing Amplitude: 2 V
Lead Channel Setting Pacing Amplitude: 2.75 V
Lead Channel Setting Pacing Pulse Width: 0.4 ms
Lead Channel Setting Pacing Pulse Width: 0.4 ms
Lead Channel Setting Sensing Sensitivity: 0.9 mV

## 2020-06-14 NOTE — Progress Notes (Signed)
Remote pacemaker transmission.   

## 2020-06-23 ENCOUNTER — Other Ambulatory Visit (HOSPITAL_COMMUNITY): Payer: Self-pay | Admitting: *Deleted

## 2020-06-23 MED ORDER — SPIRONOLACTONE 25 MG PO TABS: 25.0000 mg | ORAL_TABLET | Freq: Every day | ORAL | 5 refills | Status: AC

## 2020-06-25 ENCOUNTER — Telehealth: Payer: Self-pay

## 2020-06-25 NOTE — Telephone Encounter (Signed)
   [facility] Pre-operative Risk Assessment    HEARTCARE STAFF: - Please ensure there is not already an duplicate clearance open for this procedure. - Under Visit Info/Reason for Call, type in Other and utilize the format Clearance MM/DD/YY or Clearance TBD. Do not use dashes or single digits. - If request is for dental extraction, please clarify the # of teeth to be extracted.  Request for surgical clearance:  1. What type of surgery is being performed? Multiple Dental Extractions and Placement of Dental Implants   2. When is this surgery scheduled? TBD   3. What type of clearance is required (medical clearance vs. Pharmacy clearance to hold med vs. Both)? Medical  4. Are there any medications that need to be held prior to surgery and how long? No   5. Practice name and name of physician performing surgery? Roney Jaffe, DDS--[facility]   6. What is the office phone number? [phone]   7.   What is the office fax number? [phone]  8.   Anesthesia type (None, local, MAC, general) ? Local   Cleon Gustin 06/25/2020, 2:57 PM  _________________________________________________________________  Please comment on patient's cardiac function. Can we use local with epi?

## 2020-06-26 NOTE — Telephone Encounter (Signed)
   Primary Cardiologist: Dr. Loralie Champagne, MD/ Dr. Jolyn Nap, MD  Chart reviewed as part of pre-operative protocol coverage.   The patient was recently seen by Dr. Aundra Dubin and Dr. Caryl Comes for cardiology follow up. It seems that he was felling much better after ICD upgrade. He had no anginal symptoms and was noted to be euvolemic on exam. Given that he will not be under general anesthesia and will not require the holding of his cardiac medications, he is at acceptable risk to proceed with a relatively low risk OP procedure. The patient was advised that if he develops new symptoms prior to surgery to contact our office to arrange for a follow-up visit, and he verbalized understanding.  I will route this recommendation to the requesting party via Epic fax function and remove from pre-op pool.  Please call with questions.  Kathyrn Drown, NP 06/26/2020, 1:50 PM

## 2020-08-04 ENCOUNTER — Encounter (HOSPITAL_COMMUNITY): Payer: No Typology Code available for payment source

## 2020-08-15 IMAGING — CT CT ANGIO CHEST-ABD-PELV FOR DISSECTION W/ AND WO/W CM
2 of 7 series · 14 of 46 positions shown, 16 images · non-contrast
Comparison: CT chest 11/06/2019

CLINICAL DATA: Chest pain

EXAM:
CT ANGIOGRAPHY CHEST, ABDOMEN AND PELVIS
TECHNIQUE: Non-contrast CT of the chest was initially obtained.

[Series 6: arterial · axial · arterial · 0.71mm/px · z∈[-491,+113]mm · 11 of 338 slices shown, 13 images]
[im 18/338  soft-tissue]
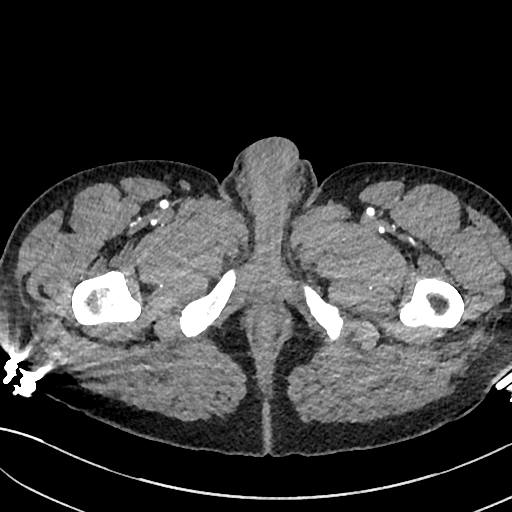
[im 18/338  bone]
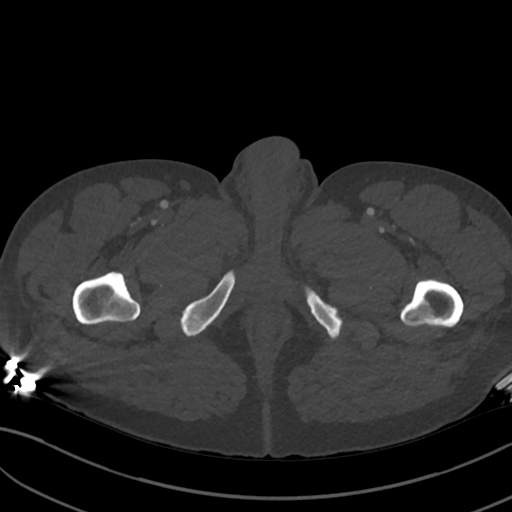
[im 54/338  soft-tissue]
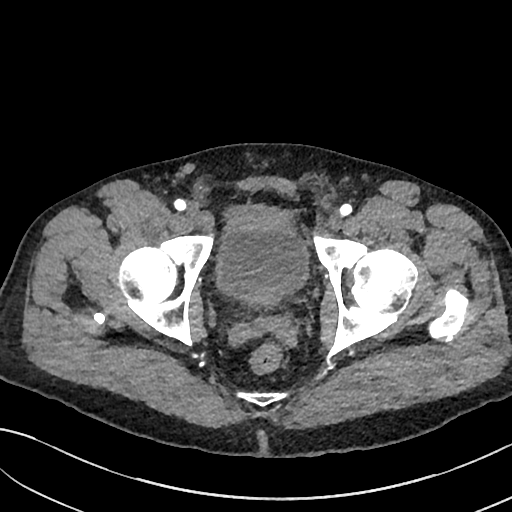
[im 89/338  soft-tissue]
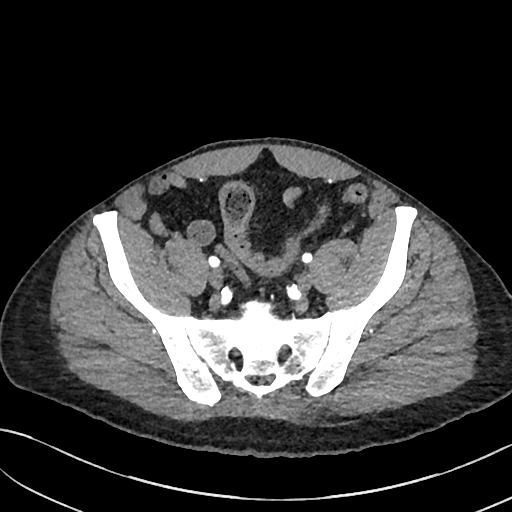
[im 107/338  soft-tissue]
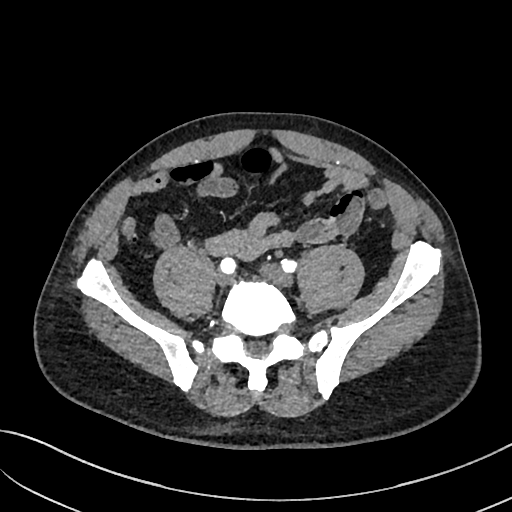
[im 142/338  soft-tissue]
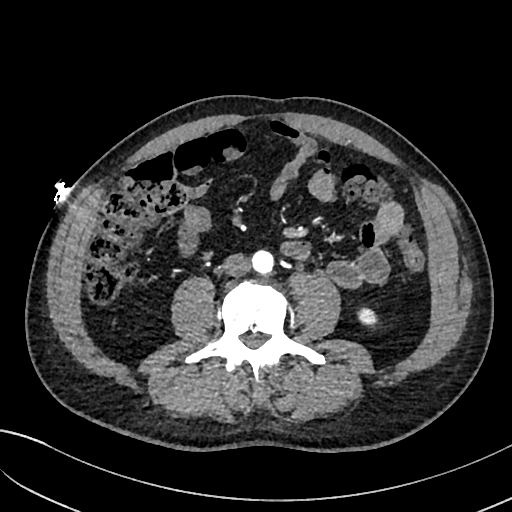
[im 178/338  soft-tissue]
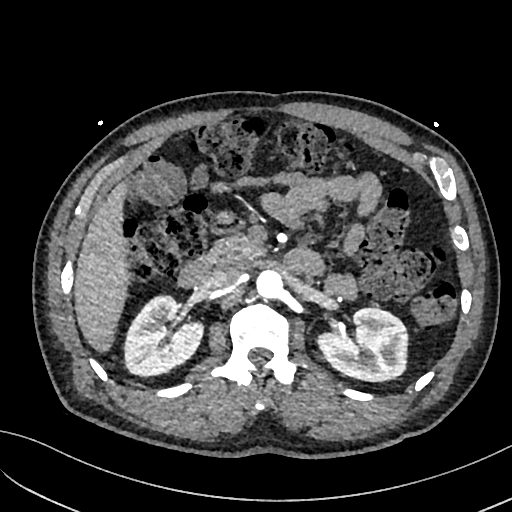
[im 196/338  soft-tissue]
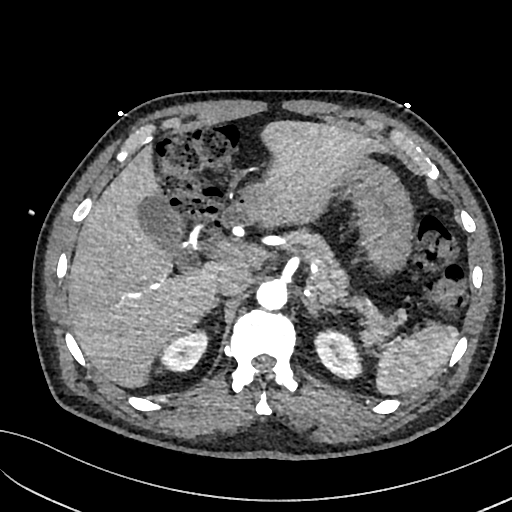
[im 231/338  soft-tissue]
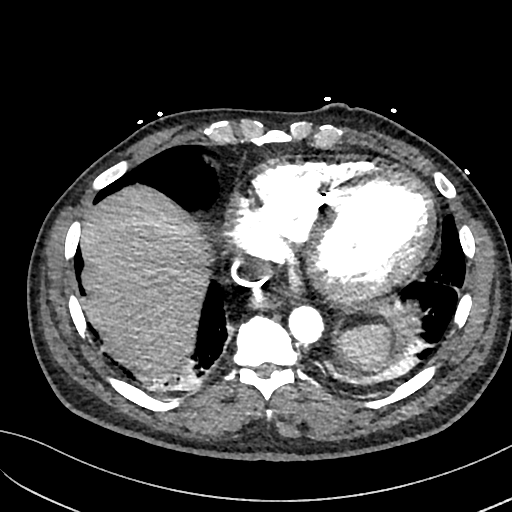
[im 249/338  soft-tissue]
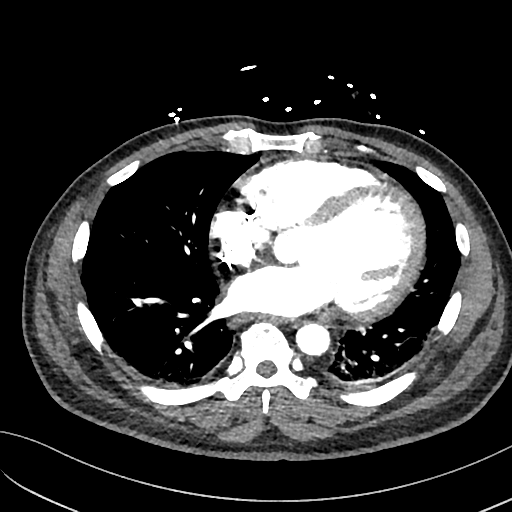
[im 249/338  bone]
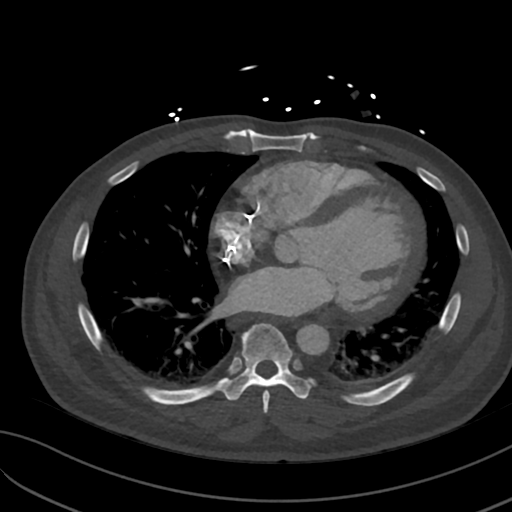
[im 284/338  soft-tissue]
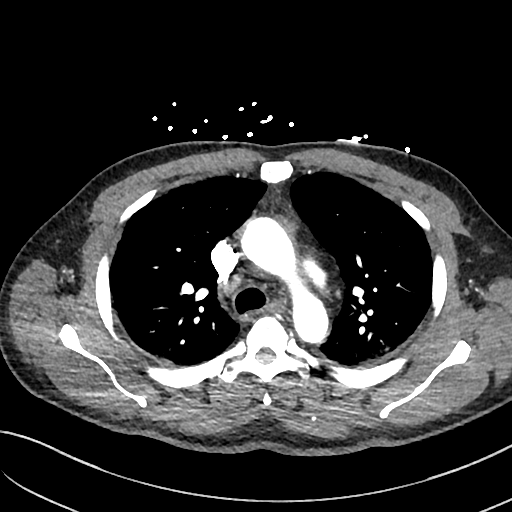
[im 320/338  soft-tissue]
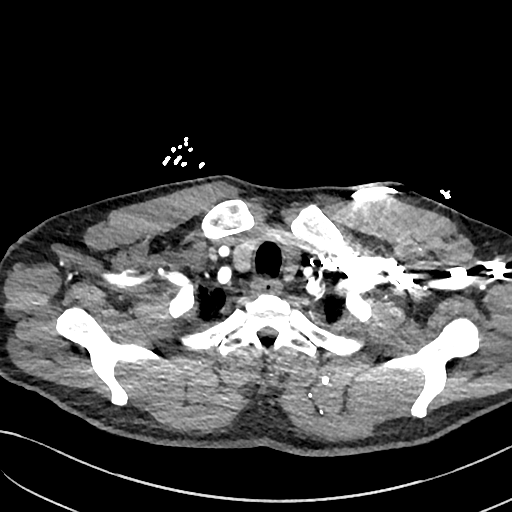

[Series 9: cor · coronal · 0.77mm/px · 3 of 149 slices shown]
[im 38/149  soft-tissue]
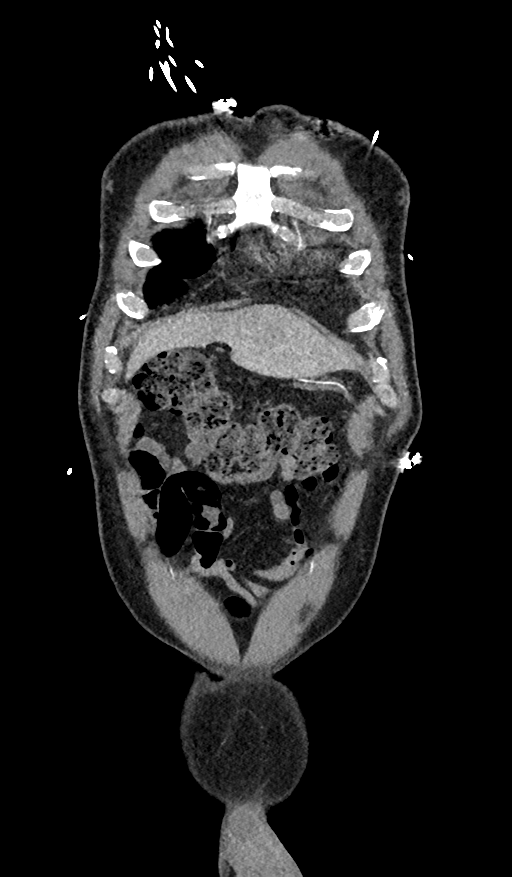
[im 75/149  soft-tissue]
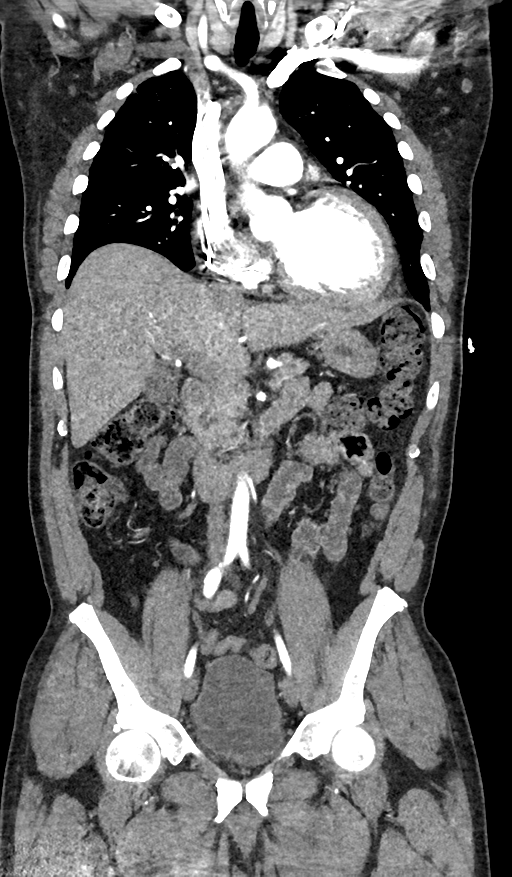
[im 112/149  soft-tissue]
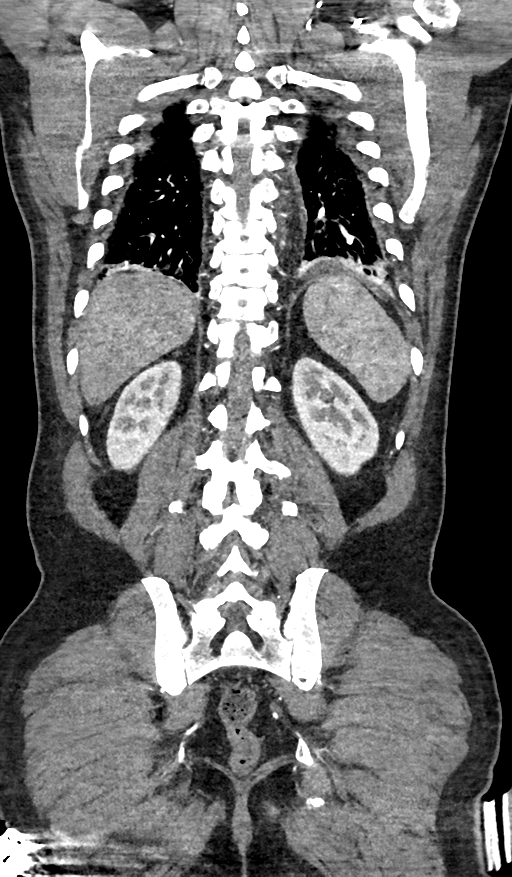

[14 of 46 positions shown; findings below may reference images not displayed]

Multidetector CT imaging through the chest, abdomen and pelvis was
performed using the standard protocol during bolus administration of
intravenous contrast. Multiplanar reconstructed images and MIPs were
obtained and reviewed to evaluate the vascular anatomy.

CONTRAST:  100mL OMNIPAQUE IOHEXOL 350 MG/ML SOLN
FINDINGS: CTA CHEST FINDINGS

Cardiovascular: No evidence of thoracic aortic aneurysm, intramural
hematoma, or dissection. No evidence of central pulmonary embolism.
New cardiomegaly. Left chest wall dual lead pacemaker. No
pericardial effusion.

Mediastinum/Nodes: There are no enlarged lymph nodes identified. No
acute abnormality.

Lungs/Pleura: Bibasilar atelectasis/scarring. Areas of paraseptal
emphysema, peribronchial thickening, and peripheral interstitial
thickening similar to the prior study. No pleural effusion or
pneumothorax.

Musculoskeletal: No acute osseous abnormality

Review of the MIP images confirms the above findings.

CTA ABDOMEN AND PELVIS FINDINGS

VASCULAR

Aorta: Normal caliber aorta without aneurysm, dissection, vasculitis
or significant stenosis. Mild atherosclerosis.

Celiac: Patent without origin stenosis.

SMA: Patent without origin stenosis.

Renals: Patent without origin stenosis.

IMA: Patent without origin stenosis.

Inflow: Patent with mild atherosclerosis.

Veins: Not evaluated.

Review of the MIP images confirms the above findings.

NON-VASCULAR

Hepatobiliary: No focal liver abnormality is seen. No gallstones,
gallbladder wall thickening, or biliary dilatation.

Pancreas: Unremarkable.

Spleen: Unremarkable.

Adrenals/Urinary Tract: Adrenals, kidneys, and bladder are
unremarkable.

Stomach/Bowel: The stomach is within normal limits. Bowel is normal
in caliber. Normal appendix.

Lymphatic: No enlarged lymph nodes identified.

Reproductive: Prostate is unremarkable.

Other: No ascites.  Small fat containing paraumbilical hernia.

Musculoskeletal: No acute osseous abnormality.

Review of the MIP images confirms the above findings.
IMPRESSION: No evidence of aortic dissection.

New cardiomegaly.

## 2020-08-18 ENCOUNTER — Ambulatory Visit (INDEPENDENT_AMBULATORY_CARE_PROVIDER_SITE_OTHER): Payer: No Typology Code available for payment source | Admitting: Internal Medicine

## 2020-08-18 ENCOUNTER — Other Ambulatory Visit: Payer: Self-pay

## 2020-08-18 ENCOUNTER — Encounter: Payer: Self-pay | Admitting: Internal Medicine

## 2020-08-18 VITALS — BP 113/70 | HR 65 | Ht 71.0 in | Wt 197.4 lb

## 2020-08-18 DIAGNOSIS — I5022 Chronic systolic (congestive) heart failure: Secondary | ICD-10-CM | POA: Diagnosis not present

## 2020-08-18 DIAGNOSIS — I428 Other cardiomyopathies: Secondary | ICD-10-CM

## 2020-08-18 DIAGNOSIS — Z950 Presence of cardiac pacemaker: Secondary | ICD-10-CM | POA: Diagnosis not present

## 2020-08-18 DIAGNOSIS — I442 Atrioventricular block, complete: Secondary | ICD-10-CM
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
## 2020-08-18 NOTE — Patient Instructions (Addendum)
Medication Instructions:  Your physician recommends that you continue on your current medications as directed. Please refer to the Current Medication list given to you today.  *If you need a refill on your cardiac medications before your next appointment, please call your pharmacy*   Lab Work: BMET today   If you have labs (blood work) drawn today and your tests are completely normal, you will receive your results only by: [name] MyChart Message (if you have MyChart) OR . A paper copy in the mail If you have any lab test that is abnormal or we need to change your treatment, we will call you to review the results.   Testing/Procedures: None ordered.    Follow-Up: At [facility], you and your health needs are our priority.  As part of our continuing mission to provide you with exceptional heart care, we have created designated Provider Care Teams.  These Care Teams include your primary Cardiologist (physician) and Advanced Practice Providers (APPs -  Physician Assistants and Nurse Practitioners) who all work together to provide you with the care you need, when you need it.  We recommend signing up for the patient portal called "MyChart".  Sign up information is provided on this After Visit Summary.  MyChart is used to connect with patients for Virtual Visits (Telemedicine).  Patients are able to view lab/test results, encounter notes, upcoming appointments, etc.  Non-urgent messages can be sent to your provider as well.   To learn more about what you can do with MyChart, go to [email or web address].    Your next appointment:   6 month(s)  The format for your next appointment:   In Person  Provider:   Dr Olin Pia PA Oda Kilts or Tommye Standard.

## 2020-08-19 LAB — BASIC METABOLIC PANEL
BUN/Creatinine Ratio: 12 (ref 10–24)
BUN: 16 mg/dL (ref 8–27)
CO2: 19 mmol/L — ABNORMAL LOW (ref 20–29)
Calcium: 9.5 mg/dL (ref 8.6–10.2)
Chloride: 100 mmol/L (ref 96–106)
Creatinine, Ser: 1.3 mg/dL — ABNORMAL HIGH (ref 0.76–1.27)
Glucose: 97 mg/dL (ref 65–99)
Potassium: 4.6 mmol/L (ref 3.5–5.2)
Sodium: 137 mmol/L (ref 134–144)
eGFR: 63 mL/min/{1.73_m2} (ref >59)

## 2020-08-25 NOTE — Progress Notes (Signed)
Advanced Heart Failure Clinic Note   PCP: [place] Cardiologist: Dr. Aundra Dubin EP- Dr. Caryl Comes   HPI: 60 y.o.with history of complete heart block s/p dual chamber Medtronic PPM 2/21, HTN, prior smoking/?COPD. He was admitted in 2/21 with CHB and syncope, echo in 2/21 with EF 60-65%. PPM was placed. Initially did ok after PPM.   In June 2021, he started to develop dyspnea and wheezing + cough. He went to the ER, thought to have COPD exacerbation and started on albuterol and steroids. He was back in the ER 1 week later with wheezing and hypoxia, was briefly on bipap and given antibiotics + a prolonged steroid taper. He returned to the ED on 11/16/19 this time with chest pain radiating to his back. A CTchestdissection protocol was performed that was notable for "new cardiomegaly" but not aortic pathology. There was no effusion on this CT scan. He was treated with dilaudid and toradol and discharged home pain free.  Shortly after, he developed cough and new orthopnea. He then developed chest pain. On 11/24/19, he went back to the ER and was admitted. He was hypertensive, HS-TnI not elevated. CTA chest showed no PE but there was a new moderate pericardial effusion. Chest pain was pleuritic. CRP elevated 20, ESR 53. Started on colchicine and prednisone continued in setting of suspected myopericarditis. COVID negative, flu negative, RSV negative. On azithromycin/ceftriaxone for possible PNA. Mildly elevated PCT at 0.33.  Echo was done, showing LV EF <20%, RV moderately decreased systolic function, small-moderate pericardial effusion surrounding heart w/o evidence for tamponade.  RHC/LHC showed no coronary disease, normal filling pressures, normal cardiac output.  Cardiac MRI did not show evidence for myocarditis (did show evidence for pericarditis). There was no evidence for cardiac sarcoidosis. Given no CAD on cath and no LGE on MRI, fall in EF may well be due to chronic RV pacing.  Patient was markedly dyssynchronous.Still not clear why the patient developed CHB. EP was consulted and recommended repeating echo in 4-6 weeks. If no improvement will need CRT upgrade.He was placed on GDMT for systolic HF, Entresto, Spiro and bisoprolol. Lasix 20 mg daily for volume control. He was continued on colchicine 0.6 mg bid for pericarditis, to be continued x 3 months.  Repeat echo in 9/21 showed EF 20-25% with moderate LV dilation.  He was seen by Dr. Caryl Comes, had Medtronic CRT-D upgrade in 10/21.    Later in 10/21, he was admitted for COPD exacerbation and was treated with nebs and steroids.  Now on Spiriva and albuterol.  He stopped smoking in 2020.   Today he returns for HF follow up.Overall feeling fine. Denies SOB/PND/Orthopnea. Walks 1-2 miles 3 days a week. Appetite ok. No fever or chills. Weight at home stable.  Taking all medications.   Medtronic device interrogation: Impedance trending down. No VT/No A Fib, Activity ~4 hours.   Labs (10/21): K 3.8, creatinine 1.07, HIV negative Labs (08/18/20): K 4.6 Creatinine 1.3   Review of Systems: All systems reviewed and negative except as noted in HPI.   PMH: 1. Hyperlipidemia 2. HTN 3. Prostate cancer 4. Pericarditis in 7/21.  5. Complete heart block: 2/21.  MDT PPM placed, later upgraded to CRT.  No evidence for sarcoidosis on cardiac MRI.  6. Chronic systolic CHF: Nonischemic cardiomyopathy.  - Cardiac MRI (7/21): EF 20% with dyssynchrony, moderate RV dysfunction, no LGE, small pericardial effusion.  - Echo (9/21): EF 20-25% with septal-lateral dyssynchrony, moderate LV dilation, normal RV.  - Upgrade to MDT  CRT-P device 10/21.  - Echo (12/21): EF 35%, mild LV dilation, normal RV function.    Current Outpatient Medications  Medication Sig Dispense Refill  . acetaminophen (TYLENOL) 500 MG tablet Take 1,000 mg by mouth every 6 (six) hours as needed for mild pain or headache.     . albuterol (PROVENTIL HFA;VENTOLIN HFA) 108  (90 BASE) MCG/ACT inhaler Inhale 2 puffs into the lungs 4 (four) times daily as needed for wheezing or shortness of breath.     . bisoprolol (ZEBETA) 5 MG tablet Take 1.5 tablets (7.5 mg total) by mouth daily. 45 tablet 5  . budesonide (PULMICORT) 0.25 MG/2ML nebulizer solution Take 2 mLs (0.25 mg total) by nebulization 2 (two) times daily. 120 mL 0  . cetirizine (ZYRTEC) 10 MG tablet Take 1 tablet by mouth as needed.    . cyclobenzaprine (FLEXERIL) 10 MG tablet Take 10 mg by mouth at bedtime as needed for muscle spasms.     . diclofenac sodium (VOLTAREN) 1 % GEL Apply 4 g topically 4 (four) times daily as needed (pain).     [name] empagliflozin (JARDIANCE) 25 MG TABS tablet Take 12.5 mg (0.5 tablets) by mouth  daily 45 tablet 3  . furosemide (LASIX) 20 MG tablet Take 1 tablet (20 mg total) by mouth daily as needed for fluid or edema. 30 tablet 5  . HYDROcodone-acetaminophen (NORCO/VICODIN) 5-325 MG tablet Take 1 tablet by mouth 3 (three) times daily as needed (for pain).     . Naphazoline HCl (CLEAR EYES OP) Place 1 drop into both eyes daily as needed (allergies).    [name] omeprazole (PRILOSEC) 20 MG capsule Take 20 mg by mouth daily.    . sacubitril-valsartan (ENTRESTO) 49-51 MG Take 0.5 tablets by mouth 2 (two) times daily.    . simvastatin (ZOCOR) 20 MG tablet Take 10 mg by mouth at bedtime.    [name] spironolactone (ALDACTONE) 25 MG tablet Take 1 tablet (25 mg total) by mouth daily. 30 tablet 5  . tamsulosin (FLOMAX) 0.4 MG CAPS capsule Take 1 capsule (0.4 mg total) by mouth at bedtime. 30 capsule 0   No current facility-administered medications for this encounter.    No Known Allergies    Social History   Socioeconomic History  . Marital status: Married    Spouse name: Not on file  . Number of children: Not on file  . Years of education: Not on file  . Highest education level: Not on file  Occupational History  . Not on file  Tobacco Use  . Smoking status: Former Smoker    Types: Cigarettes   . Smokeless tobacco: Never Used  Vaping Use  . Vaping Use: Never used  Substance and Sexual Activity  . Alcohol use: Yes    Alcohol/week: 6.0 standard drinks    Types: 6 Cans of beer per week    Comment: socially  . Drug use: No  . Sexual activity: Not on file  Other Topics Concern  . Not on file  Social History Narrative  . Not on file   Social Determinants of Health   Financial Resource Strain: Not on file  Food Insecurity: Not on file  Transportation Needs: Not on file  Physical Activity: Not on file  Stress: Not on file  Social Connections: Not on file  Intimate Partner Violence: Not on file      Family History  Problem Relation Age of Onset  . Prostate cancer Neg Hx     Vitals:   08/26/20  0938  BP: 120/68  Pulse: 72  SpO2: 95%  Weight: 90.9 kg (200 lb 6.4 oz)   Wt Readings from Last 3 Encounters:  08/26/20 90.9 kg (200 lb 6.4 oz)  08/18/20 89.5 kg (197 lb 6.4 oz)  05/26/20 89.4 kg (197 lb)     PHYSICAL EXAM: General:  Well appearing. No resp difficulty HEENT: normal Neck: supple. no JVD. Carotids 2+ bilat; no bruits. No lymphadenopathy or thryomegaly appreciated. Cor: PMI nondisplaced. Regular rate & rhythm. No rubs, gallops or murmurs. Lungs: clear Abdomen: soft, nontender, nondistended. No hepatosplenomegaly. No bruits or masses. Good bowel sounds. Extremities: no cyanosis, clubbing, rash, edema Neuro: alert & orientedx3, cranial nerves grossly intact. moves all 4 extremities w/o difficulty. Affect pleasant  ASSESSMENT & PLAN:  1. Chronic Systolic Heart Failure: Echo in 2/21 prior to PPM placement showed EF 60-65%. Echo 7/21 withLV EF < 20%, RV moderately decreased systolic function, small-moderate pericardial effusion surrounding heart w/o evidence for tamponade. In 7/21, patient had symptoms concerning for viral myopericarditis with pericardial effusion and pleuritic chest pain =>however, troponin not significantly elevated, pointing away from a  myocarditis component. RHC/LHC showed no coronary disease, normal filling pressures, normal cardiac output. Nonischemic cardiomyopathy, but cause uncertain.Cardiac MRI did not show evidence for myocarditis (did show evidence for pericarditis). There was no evidence for cardiac sarcoidosis. Given no CAD on cath and no LGE on MRI, fall in EF may well be due to chronic RV pacing. Patient was markedly dyssynchronous =>with repeat echo in 9/21 showing EF 20-25%, he was upgraded to MDT CRT-P.  Echo 04/2020 EF 35%  -NYHA I .  Volume status stable. Can use lasix as needed - Continue Entresto 49/51 mg bid,  - Continue Spironolactone 25 mg daily  - Continue empagliflozin 12.5 mg daily -Increase bisoprolol to 10 mg daily.  - Set up repeat ECHO  - Discussed optivol.  2. Pericarditis: No chest pain. Resolved.  3. H/o CHB: s/p PPM 2/21, upgraded to CRT in 10/21.  4. COPD: Quit smoking in 2020.  Recent COPD exacerbation.   Set up ECHO. Follow up with Dr Aundra Dubin in 3-4 months. Repeat BMET at that time.  Script sent to [facility] for bisoprolol.   Darrick Grinder, NP 08/26/20

## 2020-08-26 ENCOUNTER — Ambulatory Visit (HOSPITAL_COMMUNITY)
Admission: RE | Admit: 2020-08-26 | Discharge: 2020-08-26 | Disposition: A | Discharge status: Home / Self Care | Payer: No Typology Code available for payment source | Source: Ambulatory Visit | Attending: Adult Health | Admitting: Adult Health

## 2020-08-26 ENCOUNTER — Other Ambulatory Visit: Payer: Self-pay

## 2020-08-26 ENCOUNTER — Encounter (HOSPITAL_COMMUNITY): Payer: Self-pay

## 2020-08-26 VITALS — BP 120/68 | HR 72 | Wt 200.4 lb

## 2020-08-26 DIAGNOSIS — J449 Chronic obstructive pulmonary disease, unspecified: Secondary | ICD-10-CM | POA: Insufficient documentation

## 2020-08-26 DIAGNOSIS — J9621 Acute and chronic respiratory failure with hypoxia: Secondary | ICD-10-CM

## 2020-08-26 DIAGNOSIS — I110 Hypertensive heart disease with heart failure: Secondary | ICD-10-CM | POA: Insufficient documentation

## 2020-08-26 DIAGNOSIS — I5022 Chronic systolic (congestive) heart failure: Secondary | ICD-10-CM | POA: Diagnosis not present

## 2020-08-26 DIAGNOSIS — Z87891 Personal history of nicotine dependence: Secondary | ICD-10-CM | POA: Insufficient documentation

## 2020-08-26 DIAGNOSIS — E785 Hyperlipidemia, unspecified: Secondary | ICD-10-CM | POA: Insufficient documentation

## 2020-08-26 DIAGNOSIS — Z7951 Long term (current) use of inhaled steroids: Secondary | ICD-10-CM | POA: Insufficient documentation

## 2020-08-26 DIAGNOSIS — Z950 Presence of cardiac pacemaker: Secondary | ICD-10-CM

## 2020-08-26 DIAGNOSIS — I319 Disease of pericardium, unspecified: Secondary | ICD-10-CM | POA: Diagnosis not present

## 2020-08-26 DIAGNOSIS — Z79899 Other long term (current) drug therapy: Secondary | ICD-10-CM | POA: Insufficient documentation

## 2020-08-26 DIAGNOSIS — I428 Other cardiomyopathies: Secondary | ICD-10-CM | POA: Diagnosis not present

## 2020-08-26 MED ORDER — BISOPROLOL FUMARATE 10 MG PO TABS
10.0000 mg | ORAL_TABLET | Freq: Every day | ORAL | 6 refills | Status: DC
Start: 2020-08-26 — End: 2021-03-25

## 2020-08-26 MED ORDER — BISOPROLOL FUMARATE 10 MG PO TABS: 10.0000 mg | ORAL_TABLET | Freq: Every day | ORAL | Status: DC

## 2020-08-26 NOTE — Patient Instructions (Signed)
Increase Bisoprolol to 10 mg daily. Follow-up with Dr. Aundra Dubin 3-4 months Follow-up with Echo

## 2020-09-04 ENCOUNTER — Telehealth: Payer: Self-pay

## 2020-09-04 NOTE — Telephone Encounter (Signed)
Spoke with pt and advised per Dr Caryl Comes labs are normal with exception of mild change in kidney function. Pt verbalized understanding and thanked Therapist, sports for the call.

## 2020-09-04 NOTE — Telephone Encounter (Signed)
-----   Message from Deboraha Sprang, MD sent at 09/03/2020  5:20 AM EDT ----- Please Inform Patient  Labs are normal x modest change in renal function  Could we plz recheck   Thanks

## 2020-09-05 ENCOUNTER — Ambulatory Visit (INDEPENDENT_AMBULATORY_CARE_PROVIDER_SITE_OTHER): Payer: No Typology Code available for payment source

## 2020-09-05 DIAGNOSIS — I442 Atrioventricular block, complete: Secondary | ICD-10-CM

## 2020-09-05 LAB — CUP PACEART REMOTE DEVICE CHECK
Battery Remaining Longevity: 96 mo
Battery Voltage: 3.02 V
Brady Statistic AP VP Percent: 0.78 %
Brady Statistic AP VS Percent: 0.01 %
Brady Statistic AS VP Percent: 99.19 %
Brady Statistic AS VS Percent: 0.01 %
Brady Statistic RA Percent Paced: 0.79 %
Brady Statistic RV Percent Paced: 99.97 %
Date Time Interrogation Session: 20220428220215
Implantable Lead Implant Date: 20210222
Implantable Lead Implant Date: 20210222
Implantable Lead Implant Date: 20211011
Implantable Lead Location: 753858
Implantable Lead Location: 753859
Implantable Lead Location: 753860
Implantable Lead Model: 5076
Implantable Lead Model: 5076
Implantable Pulse Generator Implant Date: 20211011
Lead Channel Impedance Value: 1064 Ohm
Lead Channel Impedance Value: 1102 Ohm
Lead Channel Impedance Value: 1121 Ohm
Lead Channel Impedance Value: 1292 Ohm
Lead Channel Impedance Value: 1292 Ohm
Lead Channel Impedance Value: 1368 Ohm
Lead Channel Impedance Value: 323 Ohm
Lead Channel Impedance Value: 323 Ohm
Lead Channel Impedance Value: 399 Ohm
Lead Channel Impedance Value: 494 Ohm
Lead Channel Impedance Value: 513 Ohm
Lead Channel Impedance Value: 703 Ohm
Lead Channel Impedance Value: 703 Ohm
Lead Channel Impedance Value: 760 Ohm
Lead Channel Pacing Threshold Amplitude: 0.625 V
Lead Channel Pacing Threshold Amplitude: 0.75 V
Lead Channel Pacing Threshold Amplitude: 1.5 V
Lead Channel Pacing Threshold Pulse Width: 0.4 ms
Lead Channel Pacing Threshold Pulse Width: 0.4 ms
Lead Channel Pacing Threshold Pulse Width: 0.8 ms
Lead Channel Sensing Intrinsic Amplitude: 1.75 mV
Lead Channel Sensing Intrinsic Amplitude: 1.75 mV
Lead Channel Sensing Intrinsic Amplitude: 3.75 mV
Lead Channel Setting Pacing Amplitude: 1.5 V
Lead Channel Setting Pacing Amplitude: 2 V
Lead Channel Setting Pacing Amplitude: 2.5 V
Lead Channel Setting Pacing Pulse Width: 0.4 ms
Lead Channel Setting Pacing Pulse Width: 0.8 ms
Lead Channel Setting Sensing Sensitivity: 0.9 mV

## 2020-09-25 NOTE — Progress Notes (Signed)
Remote pacemaker transmission.   

## 2020-10-07 ENCOUNTER — Other Ambulatory Visit: Payer: Self-pay

## 2020-10-07 ENCOUNTER — Ambulatory Visit (HOSPITAL_COMMUNITY)
Admission: RE | Admit: 2020-10-07 | Discharge: 2020-10-07 | Disposition: A | Discharge status: Home / Self Care | Payer: No Typology Code available for payment source | Source: Ambulatory Visit | Attending: Adult Health | Admitting: Adult Health

## 2020-10-07 DIAGNOSIS — I5022 Chronic systolic (congestive) heart failure: Secondary | ICD-10-CM | POA: Insufficient documentation

## 2020-10-07 DIAGNOSIS — I10 Essential (primary) hypertension: Secondary | ICD-10-CM | POA: Diagnosis not present

## 2020-10-07 DIAGNOSIS — I2510 Atherosclerotic heart disease of native coronary artery without angina pectoris: Secondary | ICD-10-CM | POA: Diagnosis not present

## 2020-10-07 LAB — ECHOCARDIOGRAM COMPLETE
Area-P 1/2: 3.12 cm2
Calc EF: 39.5 %
S' Lateral: 3.6 cm
Single Plane A2C EF: 44 %
Single Plane A4C EF: 37.3 %

## 2020-10-07 NOTE — Progress Notes (Signed)
  Echocardiogram 2D Echocardiogram has been performed.  Jennette Dubin 10/07/2020, 3:51 PM

## 2020-11-25 ENCOUNTER — Ambulatory Visit (HOSPITAL_COMMUNITY)
Admission: RE | Admit: 2020-11-25 | Discharge: 2020-11-25 | Disposition: A | Discharge status: Home / Self Care | Payer: No Typology Code available for payment source | Source: Ambulatory Visit | Attending: Cardiology | Admitting: Cardiology

## 2020-11-25 ENCOUNTER — Other Ambulatory Visit: Payer: Self-pay

## 2020-11-25 ENCOUNTER — Encounter (HOSPITAL_COMMUNITY): Payer: Self-pay | Admitting: Cardiology

## 2020-11-25 VITALS — BP 110/70 | HR 70 | Wt 202.8 lb

## 2020-11-25 DIAGNOSIS — Z87891 Personal history of nicotine dependence: Secondary | ICD-10-CM | POA: Insufficient documentation

## 2020-11-25 DIAGNOSIS — I110 Hypertensive heart disease with heart failure: Secondary | ICD-10-CM | POA: Diagnosis not present

## 2020-11-25 DIAGNOSIS — E785 Hyperlipidemia, unspecified: Secondary | ICD-10-CM | POA: Insufficient documentation

## 2020-11-25 DIAGNOSIS — I5022 Chronic systolic (congestive) heart failure: Secondary | ICD-10-CM | POA: Insufficient documentation

## 2020-11-25 DIAGNOSIS — Z791 Long term (current) use of non-steroidal anti-inflammatories (NSAID): Secondary | ICD-10-CM | POA: Diagnosis not present

## 2020-11-25 DIAGNOSIS — I319 Disease of pericardium, unspecified: Secondary | ICD-10-CM | POA: Diagnosis not present

## 2020-11-25 DIAGNOSIS — Z950 Presence of cardiac pacemaker: Secondary | ICD-10-CM | POA: Diagnosis not present

## 2020-11-25 DIAGNOSIS — I313 Pericardial effusion (noninflammatory): Secondary | ICD-10-CM | POA: Insufficient documentation

## 2020-11-25 DIAGNOSIS — I428 Other cardiomyopathies: Secondary | ICD-10-CM | POA: Insufficient documentation

## 2020-11-25 DIAGNOSIS — Z7951 Long term (current) use of inhaled steroids: Secondary | ICD-10-CM | POA: Insufficient documentation

## 2020-11-25 DIAGNOSIS — Z79899 Other long term (current) drug therapy: Secondary | ICD-10-CM | POA: Insufficient documentation

## 2020-11-25 LAB — BASIC METABOLIC PANEL
Anion gap: 8 (ref 5–15)
BUN: 5 mg/dL — ABNORMAL LOW (ref 8–23)
CO2: 25 mmol/L (ref 22–32)
Calcium: 9.5 mg/dL (ref 8.9–10.3)
Chloride: 102 mmol/L (ref 98–111)
Creatinine, Ser: 1.07 mg/dL (ref 0.61–1.24)
GFR, Estimated: >60 mL/min (ref >60)
Glucose, Bld: 118 mg/dL — ABNORMAL HIGH (ref 70–99)
Potassium: 4.5 mmol/L (ref 3.5–5.1)
Sodium: 135 mmol/L (ref 135–145)

## 2020-11-25 NOTE — Progress Notes (Signed)
Advanced Heart Failure Clinic Note   PCP: [place] Cardiologist: Dr. Aundra Dubin EP- Dr. Caryl Comes   HPI: 60 y.o. with history of complete heart block s/p dual chamber Medtronic PPM 2/21, HTN, prior smoking/?COPD.  He was admitted in 2/21 with CHB and syncope, echo in 2/21 with EF 60-65%.  PPM was placed. Initially did ok after PPM.    In June 2021, he started to develop dyspnea and wheezing + cough.  He went to the ER, thought to have COPD exacerbation and started on albuterol and steroids.  He was back in the ER 1 week later with wheezing and hypoxia, was briefly on bipap and given antibiotics + a prolonged steroid taper. He returned to the ED on 11/16/19 this time with chest pain radiating to his back. A CT chest dissection protocol was performed that was notable for "new cardiomegaly" but not aortic pathology. There was no effusion on this CT scan. He was treated with dilaudid and toradol and discharged home pain free.    Shortly after, he developed cough and new orthopnea.  He then developed chest pain.  On 11/24/19, he went back to the ER and was admitted.  He was hypertensive, HS-TnI not elevated.  CTA chest showed no PE but there was a new moderate pericardial effusion.  Chest pain was pleuritic. CRP elevated 20, ESR 53. Started on colchicine and prednisone continued in setting of suspected myopericarditis. COVID negative, flu negative, RSV negative. On azithromycin/ceftriaxone for possible PNA.  Mildly elevated PCT at 0.33.  Echo was done, showing LV EF < 20%, RV moderately decreased systolic function, small-moderate pericardial effusion surrounding heart w/o evidence for tamponade.  RHC/LHC showed no coronary disease, normal filling pressures, normal cardiac output.  Cardiac MRI did not show evidence for myocarditis (did show evidence for pericarditis). There was no evidence for cardiac sarcoidosis.  Given no CAD on cath and no LGE on MRI, fall in EF may well be due to chronic RV pacing.   Patient was markedly dyssynchronous. Still not clear why the patient developed CHB. EP was consulted and recommended repeating echo in 4-6 weeks. If no improvement will need CRT upgrade.  He was placed on GDMT for systolic HF, Entresto, Spiro and bisoprolol. Lasix 20 mg daily for volume control. He was continued on colchicine 0.6 mg bid for pericarditis, to be continued x 3 months.  Repeat echo in 9/21 showed EF 20-25% with moderate LV dilation.  He was seen by Dr. Caryl Comes, had Medtronic CRT-D upgrade in 10/21.    Later in 10/21, he was admitted for COPD exacerbation and was treated with nebs and steroids.  Now on Spiriva and albuterol.  He stopped smoking in 2020.   Echo in 5/22 showed EF 55-60%, mild LVH, normal RV.   Today he returns for HF follow up.  He is feeling good overall.  Weight is stable.  No significant exertional dyspnea.  No chest pain.  No lightheadedness.  No orthopnea/PND.   Labs (10/21): K 3.8, creatinine 1.07, HIV negative Labs (4/22): K 4.6, creatinine 1.3  Review of Systems: All systems reviewed and negative except as noted in HPI.   PMH: 1. Hyperlipidemia 2. HTN 3. Prostate cancer 4. Pericarditis in 7/21.  5. Complete heart block: 2/21.  MDT PPM placed, later upgraded to CRT.  No evidence for sarcoidosis on cardiac MRI.  6. Chronic systolic CHF: Nonischemic cardiomyopathy.  - Cardiac MRI (7/21): EF 20% with dyssynchrony, moderate RV dysfunction, no LGE, small pericardial effusion.  -  Echo (9/21): EF 20-25% with septal-lateral dyssynchrony, moderate LV dilation, normal RV.  - Upgrade to MDT CRT-P device 10/21.  - Echo (12/21): EF 35%, mild LV dilation, normal RV function.    Current Outpatient Medications  Medication Sig Dispense Refill   acetaminophen (TYLENOL) 500 MG tablet Take 1,000 mg by mouth every 6 (six) hours as needed for mild pain or headache.      albuterol (PROVENTIL HFA;VENTOLIN HFA) 108 (90 BASE) MCG/ACT inhaler Inhale 2 puffs into the lungs 4  (four) times daily as needed for wheezing or shortness of breath.      bisoprolol (ZEBETA) 10 MG tablet Take 1 tablet (10 mg total) by mouth daily. 30 tablet 6   budesonide (PULMICORT) 0.25 MG/2ML nebulizer solution Take 2 mLs (0.25 mg total) by nebulization 2 (two) times daily. 120 mL 0   cetirizine (ZYRTEC) 10 MG tablet Take 1 tablet by mouth as needed.     cyclobenzaprine (FLEXERIL) 10 MG tablet Take 10 mg by mouth at bedtime as needed for muscle spasms.      diclofenac sodium (VOLTAREN) 1 % GEL Apply 4 g topically 4 (four) times daily as needed (pain).      empagliflozin (JARDIANCE) 25 MG TABS tablet Take 12.5 mg (0.5 tablets) by mouth  daily 45 tablet 3   furosemide (LASIX) 20 MG tablet Take 1 tablet (20 mg total) by mouth daily as needed for fluid or edema. 30 tablet 5   HYDROcodone-acetaminophen (NORCO/VICODIN) 5-325 MG tablet Take 1 tablet by mouth 3 (three) times daily as needed (for pain).      Naphazoline HCl (CLEAR EYES OP) Place 1 drop into both eyes daily as needed (allergies).     omeprazole (PRILOSEC) 20 MG capsule Take 20 mg by mouth daily.     sacubitril-valsartan (ENTRESTO) 49-51 MG Take 0.5 tablets by mouth 2 (two) times daily.     simvastatin (ZOCOR) 20 MG tablet Take 10 mg by mouth at bedtime.     spironolactone (ALDACTONE) 25 MG tablet Take 1 tablet (25 mg total) by mouth daily. 30 tablet 5   tamsulosin (FLOMAX) 0.4 MG CAPS capsule Take 1 capsule (0.4 mg total) by mouth at bedtime. 30 capsule 0   No current facility-administered medications for this encounter.    No Known Allergies    Social History   Socioeconomic History   Marital status: Married    Spouse name: Not on file   Number of children: Not on file   Years of education: Not on file   Highest education level: Not on file  Occupational History   Not on file  Tobacco Use   Smoking status: Former    Types: Cigarettes   Smokeless tobacco: Never  Vaping Use   Vaping Use: Never used  Substance and  Sexual Activity   Alcohol use: Yes    Alcohol/week: 6.0 standard drinks    Types: 6 Cans of beer per week    Comment: socially   Drug use: No   Sexual activity: Not on file  Other Topics Concern   Not on file  Social History Narrative   Not on file   Social Determinants of Health   Financial Resource Strain: Not on file  Food Insecurity: Not on file  Transportation Needs: Not on file  Physical Activity: Not on file  Stress: Not on file  Social Connections: Not on file  Intimate Partner Violence: Not on file      Family History  Problem Relation Age of  Onset   Prostate cancer Neg Hx     Vitals:   11/25/20 0951  BP: 110/70  Pulse: 70  SpO2: 94%  Weight: 92 kg (202 lb 12.8 oz)   Wt Readings from Last 3 Encounters:  11/25/20 92 kg (202 lb 12.8 oz)  08/26/20 90.9 kg (200 lb 6.4 oz)  08/18/20 89.5 kg (197 lb 6.4 oz)     PHYSICAL EXAM: General: NAD Neck: No JVD, no thyromegaly or thyroid nodule.  Lungs: Clear to auscultation bilaterally with normal respiratory effort. CV: Nondisplaced PMI.  Heart regular S1/S2, no S3/S4, no murmur.  No peripheral edema.  No carotid bruit.  Normal pedal pulses.  Abdomen: Soft, nontender, no hepatosplenomegaly, no distention.  Skin: Intact without lesions or rashes.  Neurologic: Alert and oriented x 3.  Psych: Normal affect. Extremities: No clubbing or cyanosis.  HEENT: Normal.  ASSESSMENT & PLAN:  1. Chronic Systolic Heart Failure: Echo in 2/21 prior to PPM placement showed EF 60-65%. Echo 7/21 with LV EF < 20%, RV moderately decreased systolic function, small-moderate pericardial effusion surrounding heart w/o evidence for tamponade. In 7/21, patient had symptoms concerning for viral myopericarditis with pericardial effusion and pleuritic chest pain => however, troponin not significantly elevated, pointing away from a myocarditis component.  RHC/LHC showed no coronary disease, normal filling pressures, normal cardiac output.   Nonischemic cardiomyopathy, but cause uncertain. Cardiac MRI did not show evidence for myocarditis (did show evidence for pericarditis). There was no evidence for cardiac sarcoidosis.  Given no CAD on cath and no LGE on MRI, fall in EF may well be due to chronic RV pacing.  Patient was markedly dyssynchronous => with repeat echo in 9/21 showing EF 20-25%, he was upgraded to MDT CRT-P.  By 5/22, EF was up to 55-60% => robust response to CRT. NYHA class I symptoms, he is not volume overloaded.  - Continue Entresto 24/26 mg bid.  - Continue Spironolactone 25 mg daily, BMET today.  - Continue empagliflozin 12.5 mg daily - Continue bisoprolol 10 mg daily.  - Would repeat echo in 1 year in 5/23.  2. Pericarditis: No further chest pain.  3. H/o CHB: s/p PPM 2/21, upgraded to CRT in 10/21.  4. COPD: Quit smoking in 2020.    BMET 3 months, Followup in 6 months with APP, repeat echo 5/23  Loralie Champagne 11/25/2020

## 2020-11-25 NOTE — Patient Instructions (Addendum)
No medications changes   Lab work drawn today we will call you with any abnormal results  Your physician has requested that you have an echocardiogram. Echocardiography is a painless test that uses sound waves to create images of your heart. It provides your doctor with information about the size and shape of your heart and how well your heart's chambers and valves are working. This procedure takes approximately one hour. There are no restrictions for this procedure.  Your physician recommends that you schedule a follow-up appointment in: 6 months. Lab work follow up in 3 months  Echocardiogram for a year  If you have any questions or concerns before your next appointment please send [place] a message through [place] or call our office at [phone].    TO LEAVE A MESSAGE FOR THE NURSE SELECT OPTION 2, PLEASE LEAVE A MESSAGE INCLUDING: YOUR NAME DATE OF BIRTH CALL BACK NUMBER REASON FOR CALL**this is important as we prioritize the call backs  YOU WILL RECEIVE A CALL BACK THE SAME DAY AS LONG AS YOU CALL BEFORE 4:00 PM  milAt the [facility], you and your health needs are our priority. As part of our continuing mission to provide you with exceptional heart care, we have created designated Provider Care Teams. These Care Teams include your primary Cardiologist (physician) and Advanced Practice Providers (APPs- Physician Assistants and Nurse Practitioners) who all work together to provide you with the care you need, when you need it.   You may see any of the following providers on your designated Care Team at your next follow up: Dr Glori Bickers Dr Loralie Champagne Dr Patrice Paradise, NP Lyda Jester, [place] Ginnie Smart Audry Riles, PharmD   Please be sure to bring in all your medications bottles to every appointment.

## 2020-12-05 ENCOUNTER — Ambulatory Visit (INDEPENDENT_AMBULATORY_CARE_PROVIDER_SITE_OTHER): Payer: No Typology Code available for payment source

## 2020-12-05 DIAGNOSIS — I442 Atrioventricular block, complete: Secondary | ICD-10-CM

## 2020-12-06 LAB — CUP PACEART REMOTE DEVICE CHECK
Battery Remaining Longevity: 57 mo
Battery Voltage: 3 V
Brady Statistic AP VP Percent: 1.66 %
Brady Statistic AP VS Percent: 0.01 %
Brady Statistic AS VP Percent: 98.32 %
Brady Statistic AS VS Percent: 0.01 %
Brady Statistic RA Percent Paced: 1.67 %
Brady Statistic RV Percent Paced: 99.98 %
Date Time Interrogation Session: 20220728210531
Implantable Lead Implant Date: 20210222
Implantable Lead Implant Date: 20210222
Implantable Lead Implant Date: 20211011
Implantable Lead Location: 753858
Implantable Lead Location: 753859
Implantable Lead Location: 753860
Implantable Lead Model: 5076
Implantable Lead Model: 5076
Implantable Pulse Generator Implant Date: 20211011
Lead Channel Impedance Value: 1121 Ohm
Lead Channel Impedance Value: 1178 Ohm
Lead Channel Impedance Value: 1216 Ohm
Lead Channel Impedance Value: 1235 Ohm
Lead Channel Impedance Value: 1254 Ohm
Lead Channel Impedance Value: 1387 Ohm
Lead Channel Impedance Value: 323 Ohm
Lead Channel Impedance Value: 323 Ohm
Lead Channel Impedance Value: 399 Ohm
Lead Channel Impedance Value: 532 Ohm
Lead Channel Impedance Value: 589 Ohm
Lead Channel Impedance Value: 665 Ohm
Lead Channel Impedance Value: 703 Ohm
Lead Channel Impedance Value: 798 Ohm
Lead Channel Pacing Threshold Amplitude: 0.625 V
Lead Channel Pacing Threshold Amplitude: 0.875 V
Lead Channel Pacing Threshold Amplitude: 2.75 V
Lead Channel Pacing Threshold Pulse Width: 0.4 ms
Lead Channel Pacing Threshold Pulse Width: 0.4 ms
Lead Channel Pacing Threshold Pulse Width: 0.8 ms
Lead Channel Sensing Intrinsic Amplitude: 1.25 mV
Lead Channel Sensing Intrinsic Amplitude: 1.25 mV
Lead Channel Sensing Intrinsic Amplitude: 3.75 mV
Lead Channel Setting Pacing Amplitude: 1.5 V
Lead Channel Setting Pacing Amplitude: 2 V
Lead Channel Setting Pacing Amplitude: 4.25 V
Lead Channel Setting Pacing Pulse Width: 0.4 ms
Lead Channel Setting Pacing Pulse Width: 0.8 ms
Lead Channel Setting Sensing Sensitivity: 0.9 mV

## 2020-12-31 NOTE — Progress Notes (Signed)
Remote pacemaker transmission.   

## 2021-02-25 ENCOUNTER — Ambulatory Visit (HOSPITAL_COMMUNITY)
Admission: RE | Admit: 2021-02-25 | Discharge: 2021-02-25 | Disposition: A | Discharge status: Home / Self Care | Payer: No Typology Code available for payment source | Source: Ambulatory Visit | Attending: Cardiology | Admitting: Cardiology

## 2021-02-25 ENCOUNTER — Other Ambulatory Visit: Payer: Self-pay

## 2021-02-25 DIAGNOSIS — I5022 Chronic systolic (congestive) heart failure: Secondary | ICD-10-CM | POA: Insufficient documentation

## 2021-02-25 LAB — BASIC METABOLIC PANEL
Anion gap: 6 (ref 5–15)
BUN: 9 mg/dL (ref 8–23)
CO2: 26 mmol/L (ref 22–32)
Calcium: 9.1 mg/dL (ref 8.9–10.3)
Chloride: 106 mmol/L (ref 98–111)
Creatinine, Ser: 1.1 mg/dL (ref 0.61–1.24)
GFR, Estimated: >60 mL/min (ref >60)
Glucose, Bld: 98 mg/dL (ref 70–99)
Potassium: 4.4 mmol/L (ref 3.5–5.1)
Sodium: 138 mmol/L (ref 135–145)

## 2021-03-06 ENCOUNTER — Ambulatory Visit (INDEPENDENT_AMBULATORY_CARE_PROVIDER_SITE_OTHER): Payer: No Typology Code available for payment source

## 2021-03-06 DIAGNOSIS — I5022 Chronic systolic (congestive) heart failure: Secondary | ICD-10-CM | POA: Diagnosis not present

## 2021-03-06 LAB — CUP PACEART REMOTE DEVICE CHECK
Battery Remaining Longevity: 49 mo
Battery Voltage: 2.98 V
Brady Statistic AP VP Percent: 0.64 %
Brady Statistic AP VS Percent: 0.01 %
Brady Statistic AS VP Percent: 99.31 %
Brady Statistic AS VS Percent: 0.03 %
Brady Statistic RA Percent Paced: 0.66 %
Brady Statistic RV Percent Paced: 99.96 %
Date Time Interrogation Session: 20221027230316
Implantable Lead Implant Date: 20210222
Implantable Lead Implant Date: 20210222
Implantable Lead Implant Date: 20211011
Implantable Lead Location: 753858
Implantable Lead Location: 753859
Implantable Lead Location: 753860
Implantable Lead Model: 5076
Implantable Lead Model: 5076
Implantable Pulse Generator Implant Date: 20211011
Lead Channel Impedance Value: 1064 Ohm
Lead Channel Impedance Value: 1102 Ohm
Lead Channel Impedance Value: 1121 Ohm
Lead Channel Impedance Value: 1159 Ohm
Lead Channel Impedance Value: 1330 Ohm
Lead Channel Impedance Value: 304 Ohm
Lead Channel Impedance Value: 304 Ohm
Lead Channel Impedance Value: 380 Ohm
Lead Channel Impedance Value: 456 Ohm
Lead Channel Impedance Value: 513 Ohm
Lead Channel Impedance Value: 551 Ohm
Lead Channel Impedance Value: 665 Ohm
Lead Channel Impedance Value: 760 Ohm
Lead Channel Impedance Value: 950 Ohm
Lead Channel Pacing Threshold Amplitude: 0.625 V
Lead Channel Pacing Threshold Amplitude: 0.875 V
Lead Channel Pacing Threshold Amplitude: 2.75 V
Lead Channel Pacing Threshold Pulse Width: 0.4 ms
Lead Channel Pacing Threshold Pulse Width: 0.4 ms
Lead Channel Pacing Threshold Pulse Width: 0.8 ms
Lead Channel Sensing Intrinsic Amplitude: 1.125 mV
Lead Channel Sensing Intrinsic Amplitude: 1.125 mV
Lead Channel Sensing Intrinsic Amplitude: 7.125 mV
Lead Channel Sensing Intrinsic Amplitude: 7.125 mV
Lead Channel Setting Pacing Amplitude: 1.5 V
Lead Channel Setting Pacing Amplitude: 2 V
Lead Channel Setting Pacing Amplitude: 4.5 V
Lead Channel Setting Pacing Pulse Width: 0.4 ms
Lead Channel Setting Pacing Pulse Width: 0.8 ms
Lead Channel Setting Sensing Sensitivity: 0.9 mV

## 2021-03-16 NOTE — Progress Notes (Signed)
Remote pacemaker transmission.   

## 2021-03-25 ENCOUNTER — Other Ambulatory Visit (HOSPITAL_COMMUNITY): Payer: Self-pay | Admitting: *Deleted

## 2021-03-25 ENCOUNTER — Telehealth (HOSPITAL_COMMUNITY): Payer: Self-pay

## 2021-03-25 MED ORDER — BISOPROLOL FUMARATE 10 MG PO TABS: 10.0000 mg | ORAL_TABLET | Freq: Every day | ORAL | 3 refills | Status: AC

## 2021-03-25 NOTE — Telephone Encounter (Signed)
Patient came to office today requesting a refill of his bisoprolol be sent to [place].

## 2021-04-08 NOTE — Progress Notes (Signed)
Cardiology Office Note Date:  04/09/2021  Patient ID:  Glenn Brandt, DOB 22-Mar-1961, MRN [id number] PCP:  [facility]  Cardiologist:  Dr. Aundra Dubin Electrophysiologist: Dr. Caryl Comes    Chief Complaint:  1mo  History of Present Illness: Glenn Brandt is a 60 y.o. male with history of CHB w/PPM >> CRT, NICM (suspect viral), HTN, HLD, chronic CHF (systolic), ?COPD (prior smoker)  She comes in today to  be seen for Dr. Caryl Comes, last seen by him in April, felt to be euvolemic, doing better, planned to f/u with HF teama nd perhaps update her echo.  She saw Dr. Aundra Dubin last July 2022, TTE in may noted recovered LVEF to 55-60%, pt was feeling well, no changes were made.  TODAY He is doing very well Denies any CP, palpitations or cardiac awareness No SOB, DOE Walks M-W-F for exercise, feels like he has good exertional capacity with no difficulties with ADls as well. 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[place])    Question of CAD   Prostate cancer ([place])    PTSD (post-traumatic stress disorder)     Past Surgical History:  Procedure Laterality Date   BIV UPGRADE N/A 02/18/2020   Procedure: BIV UPGRADE;  Surgeon: Deboraha Sprang, MD;  Location: [facility];  Service: Cardiovascular;  Laterality: N/A;   EXPLORATORY LAPAROTOMY W/ BOWEL RESECTION     due to [facility]   LEFT HEART CATH AND CORONARY ANGIOGRAPHY     PACEMAKER IMPLANT N/A 07/02/2019   Procedure: PACEMAKER IMPLANT;  Surgeon: Deboraha Sprang, MD;  Location: [facility];  Service: Cardiovascular;  Laterality: N/A;   RIGHT/LEFT HEART CATH AND CORONARY ANGIOGRAPHY N/A 11/26/2019   Procedure: RIGHT/LEFT HEART CATH AND CORONARY ANGIOGRAPHY;  Surgeon: Larey Dresser, MD;  Location:  [facility];  Service: Cardiovascular;  Laterality: N/A;    Current Outpatient Medications  Medication Sig Dispense Refill   acetaminophen (TYLENOL) 500 MG tablet Take 1,000 mg by mouth every 6 (six) hours as needed for mild pain or headache.      bisoprolol (ZEBETA) 10 MG tablet Take 1 tablet (10 mg total) by mouth daily. 90 tablet 3   cetirizine (ZYRTEC) 10 MG tablet Take 1 tablet by mouth as needed.     colchicine 0.6 MG tablet Take 1 tablet by mouth as needed. Gout     cyclobenzaprine (FLEXERIL) 10 MG tablet Take 10 mg by mouth at bedtime as needed for muscle spasms.      diclofenac sodium (VOLTAREN) 1 % GEL Apply 4 g topically 4 (four) times daily as needed (pain).      empagliflozin (JARDIANCE) 25 MG TABS tablet Take 12.5 mg (0.5 tablets) by mouth  daily 45 tablet 3   furosemide (LASIX) 20 MG tablet Take 1 tablet (20 mg total) by mouth daily as needed for fluid or edema. 30 tablet 5   HYDROcodone-acetaminophen (NORCO/VICODIN) 5-325 MG tablet Take 1 tablet by mouth 3 (three) times daily as needed (for pain).      Naphazoline HCl (CLEAR EYES OP) Place 1 drop into both eyes daily as needed (allergies).     omeprazole (PRILOSEC) 20 MG capsule Take 20 mg by mouth daily.  sacubitril-valsartan (ENTRESTO) 49-51 MG Take 0.5 tablets by mouth 2 (two) times daily.     simvastatin (ZOCOR) 20 MG tablet Take 10 mg by mouth at bedtime.     spironolactone (ALDACTONE) 25 MG tablet Take 1 tablet (25 mg total) by mouth daily. 30 tablet 5   tamsulosin (FLOMAX) 0.4 MG CAPS capsule Take 1 capsule (0.4 mg total) by mouth at bedtime. 30 capsule 0   Tiotropium Bromide Monohydrate 2.5 MCG/ACT AERS 2 puffs daily at 6 (six) AM.     No current facility-administered medications for this visit.    Allergies:   Patient has no known allergies.   Social History:  The patient  reports that he has quit smoking. His smoking use included cigarettes. He has never used smokeless tobacco. He reports current alcohol  use of about 6.0 standard drinks per week. He reports that he does not use drugs.   Family History:  The patient's family history is not on file.  ROS:  Please see the history of present illness.    All other systems are reviewed and otherwise negative.   PHYSICAL EXAM:  VS:  BP 100/64   Pulse 68   Ht 5\' 11"  (1.803 m)   Wt 192 lb (87.1 kg)   SpO2 97%   BMI 26.78 kg/m  BMI: Body mass index is 26.78 kg/m. Well nourished, well developed, in no acute distress HEENT: normocephalic, atraumatic Neck: no JVD, carotid bruits or masses Cardiac:  RRR; no significant murmurs, no rubs, or gallops Lungs:  CTA b/l, no wheezing, rhonchi or rales Abd: soft, nontender MS: no deformity or atrophy Ext: no edema Skin: warm and dry, no rash Neuro:  No gross deficits appreciated Psych: euthymic mood, full affect  PPM site is stable, no tethering or discomfort   EKG:  not done today  Device interrogation done today and reviewed by myself:  Battery and lead measurements are good No arrhythmias 100% effective VP  10/07/20; TTE IMPRESSIONS   1. Left ventricular ejection fraction, by estimation, is 55 to 60%. The  left ventricle has normal function. The left ventricle has no regional  wall motion abnormalities. There is mild left ventricular hypertrophy.  Left ventricular diastolic parameters  were normal.   2. Right ventricular systolic function is normal. The right ventricular  size is normal.   3. Right atrial size was mildly dilated.   4. The mitral valve is normal in structure. Trivial mitral valve  regurgitation.   5. The aortic valve is normal in structure. Aortic valve regurgitation is  not visualized.    05/05/2020; TTE IMPRESSIONS   1. Left ventricular ejection fraction, by estimation, is 35%. The left  ventricle has moderately decreased function. The left ventricle  demonstrates global hypokinesis. The left ventricular internal cavity size  was mildly dilated. Left ventricular   diastolic parameters are consistent with Grade I diastolic dysfunction  (impaired relaxation).   2. Right ventricular systolic function is normal. The right ventricular  size is normal. Tricuspid regurgitation signal is inadequate for assessing  PA pressure.   3. The mitral valve is normal in structure. No evidence of mitral valve  regurgitation. No evidence of mitral stenosis.   4. The aortic valve is tricuspid. Aortic valve regurgitation is not  visualized. No aortic stenosis is present.   5. The inferior vena cava is normal in size with greater than 50%  respiratory variability, suggesting right atrial pressure of 3 mmHg.     11/27/2019: c.MRI IMPRESSION: 1. Moderately LV  with septal lateral dyssynchrony and diffuse hypokinesis, EF 20% (estimated as dyssynchrony made quantification difficult/inaccurate).   2. Mildly dilated RV with moderate systolic dysfunction. RV pacemaker.   3. No intramyocardial LGE, but there were areas of enhancement in the pericardium, suggestive of possible pericarditis.   4.  Small pericardial effusion.   11/26/2019: R/LHC 1. Normal filling pressures.  2. Normal cardiac output.  3. No significant coronary disease => nonischemic cardiomyopathy.    No evidence for pericardial tamponade, no respirophasic ventricular interdependence and filling pressures were normal without equalization.    11/24/2019; TTE IMPRESSIONS   1. Left ventricular ejection fraction, by estimation, is 20 to 25%. The  left ventricle has severely decreased function. The left ventricle  demonstrates global hypokinesis. The left ventricular internal cavity size  was moderately dilated. Left  ventricular diastolic parameters are consistent with Grade II diastolic  dysfunction (pseudonormalization). Elevated left atrial pressure.   2. Right ventricular systolic function is normal. The right ventricular  size is normal. Tricuspid regurgitation signal is inadequate for assessing   PA pressure.   3. Left atrial size was mildly dilated.   4. There are frequent PACs, which make interpretation of the Doppler  signal more challenging, but there is no clear evidence of enhanced  respiratory mitral or tricuspid flow variation to support tamponade. There  is no diastolic RA or RV chamber  collapse.. Moderate pericardial effusion. The pericardial effusion is  circumferential. There is no evidence of cardiac tamponade. Moderate  pleural effusion in the left lateral region.   5. The mitral valve is normal in structure. Trivial mitral valve  regurgitation.   6. The aortic valve is normal in structure. Aortic valve regurgitation is  not visualized.   7. The inferior vena cava is dilated in size with <50% respiratory  variability, suggesting right atrial pressure of 15 mmHg.    Recent Labs: 02/25/2021: BUN 9; Creatinine, Ser 1.10; Potassium 4.4; Sodium 138  No results found for requested labs within last 8760 hours.   CrCl cannot be calculated (Patient's most recent lab result is older than the maximum 21 days allowed.).   Wt Readings from Last 3 Encounters:  04/09/21 192 lb (87.1 kg)  11/25/20 202 lb 12.8 oz (92 kg)  08/26/20 200 lb 6.4 oz (90.9 kg)     Other studies reviewed: Additional studies/records reviewed today include: summarized above  ASSESSMENT AND PLAN:  CRT-P CHB Intact function No programming changes made  NICM Chronic CHF Recovered LVEF No symptoms or exam findings of volume OL On BB, Entresto, Jardiance, lasix, aldactone OptiVol looks good Follows w/Dr. Aundra Dubin, F team   Disposition: F/u with remotes as usual, in clinic in 1 year, sooner if needed  Current medicines are reviewed at length with the patient today.  The patient did not have any concerns regarding medicines.  Venetia Night, PA-C 04/09/2021 12:54 PM     [address] [address] [phone] (office)  [phone]  (fax)

## 2021-04-09 ENCOUNTER — Encounter: Payer: Self-pay | Admitting: Physician Assistant

## 2021-04-09 ENCOUNTER — Other Ambulatory Visit: Payer: Self-pay

## 2021-04-09 ENCOUNTER — Ambulatory Visit (INDEPENDENT_AMBULATORY_CARE_PROVIDER_SITE_OTHER): Payer: No Typology Code available for payment source | Admitting: Physician Assistant

## 2021-04-09 VITALS — BP 100/64 | HR 68 | Ht 71.0 in | Wt 192.0 lb

## 2021-04-09 DIAGNOSIS — Z950 Presence of cardiac pacemaker: Secondary | ICD-10-CM | POA: Diagnosis not present

## 2021-04-09 DIAGNOSIS — I5022 Chronic systolic (congestive) heart failure: Secondary | ICD-10-CM | POA: Diagnosis not present

## 2021-04-09 DIAGNOSIS — I428 Other cardiomyopathies: Secondary | ICD-10-CM

## 2021-04-09 NOTE — Patient Instructions (Signed)
Medication Instructions:   Your physician recommends that you continue on your current medications as directed. Please refer to the Current Medication list given to you today.  *If you need a refill on your cardiac medications before your next appointment, please call your pharmacy*   Lab Work: [place]   If you have labs (blood work) drawn today and your tests are completely normal, you will receive your results only by: [place] (if you have MyChart) OR A paper copy in the mail If you have any lab test that is abnormal or we need to change your treatment, we will call you to review the results.   Testing/Procedures: NONE ORDERED  TODAY     Follow-Up: At [facility], you and your health needs are our priority.  As part of our continuing mission to provide you with exceptional heart care, we have created designated Provider Care Teams.  These Care Teams include your primary Cardiologist (physician) and Advanced Practice Providers (APPs -  Physician Assistants and Nurse Practitioners) who all work together to provide you with the care you need, when you need it.  We recommend signing up for the patient portal called "MyChart".  Sign up information is provided on this After Visit Summary.  MyChart is used to connect with patients for Virtual Visits (Telemedicine).  Patients are able to view lab/test results, encounter notes, upcoming appointments, etc.  Non-urgent messages can be sent to your provider as well.   To learn more about what you can do with MyChart, go to [email or web address].    Your next appointment:  [place]    1 year(s)  The format for your next appointment:   In Person  Provider:   You may see Virl Axe, MD or one of the following Advanced Practice Providers on your designated Care Team:   Tommye Standard, [place] Legrand Como "Jonni Sanger" Chalmers Cater, [place]    Other Instructions

## 2021-04-21 ENCOUNTER — Encounter (HOSPITAL_COMMUNITY): Payer: No Typology Code available for payment source | Admitting: Cardiology

## 2021-04-22 ENCOUNTER — Ambulatory Visit (HOSPITAL_COMMUNITY)
Admission: RE | Admit: 2021-04-22 | Discharge: 2021-04-22 | Disposition: A | Discharge status: Home / Self Care | Payer: No Typology Code available for payment source | Source: Ambulatory Visit | Attending: Cardiology | Admitting: Cardiology

## 2021-04-22 ENCOUNTER — Encounter (HOSPITAL_COMMUNITY): Payer: Self-pay | Admitting: Cardiology

## 2021-04-22 ENCOUNTER — Other Ambulatory Visit: Payer: Self-pay

## 2021-04-22 VITALS — BP 104/60 | HR 70 | Wt 193.8 lb

## 2021-04-22 DIAGNOSIS — Z79899 Other long term (current) drug therapy: Secondary | ICD-10-CM | POA: Diagnosis not present

## 2021-04-22 DIAGNOSIS — Z950 Presence of cardiac pacemaker: Secondary | ICD-10-CM | POA: Insufficient documentation

## 2021-04-22 DIAGNOSIS — I110 Hypertensive heart disease with heart failure: Secondary | ICD-10-CM | POA: Insufficient documentation

## 2021-04-22 DIAGNOSIS — Z87891 Personal history of nicotine dependence: Secondary | ICD-10-CM | POA: Diagnosis not present

## 2021-04-22 DIAGNOSIS — J449 Chronic obstructive pulmonary disease, unspecified: Secondary | ICD-10-CM | POA: Diagnosis not present

## 2021-04-22 DIAGNOSIS — I5022 Chronic systolic (congestive) heart failure: Secondary | ICD-10-CM | POA: Insufficient documentation

## 2021-04-22 DIAGNOSIS — I428 Other cardiomyopathies: Secondary | ICD-10-CM | POA: Diagnosis not present

## 2021-04-22 DIAGNOSIS — I319 Disease of pericardium, unspecified: Secondary | ICD-10-CM | POA: Insufficient documentation

## 2021-04-22 LAB — BASIC METABOLIC PANEL
Anion gap: 5 (ref 5–15)
BUN: 9 mg/dL (ref 8–23)
CO2: 27 mmol/L (ref 22–32)
Calcium: 9.3 mg/dL (ref 8.9–10.3)
Chloride: 104 mmol/L (ref 98–111)
Creatinine, Ser: 0.96 mg/dL (ref 0.61–1.24)
GFR, Estimated: >60 mL/min (ref >60)
Glucose, Bld: 110 mg/dL — ABNORMAL HIGH (ref 70–99)
Potassium: 4.6 mmol/L (ref 3.5–5.1)
Sodium: 136 mmol/L (ref 135–145)

## 2021-04-22 LAB — LIPID PANEL
Cholesterol: 170 mg/dL (ref 0–200)
HDL: 46 mg/dL (ref >40)
LDL Cholesterol: 106 mg/dL — ABNORMAL HIGH (ref 0–99)
Total CHOL/HDL Ratio: 3.7 RATIO
Triglycerides: 91 mg/dL (ref <150)
VLDL: 18 mg/dL (ref 0–40)

## 2021-04-22 NOTE — Patient Instructions (Signed)
Medication Changes:  None  Lab Work:  Labs done today, we will call you for abnormal results  Testing/Procedures:  Your physician has requested that you have an echocardiogram. Echocardiography is a painless test that uses sound waves to create images of your heart. It provides your doctor with information about the size and shape of your heart and how well your hearts chambers and valves are working. This procedure takes approximately one hour. There are no restrictions for this procedure. IN 6 MONTHS  Referrals:  None  Special Instructions // Education:  None  Follow-Up in: 6 months with an echocardiogram (June 2023), **PLEASE CALL OUR OFFICE IN April TO SCHEDULE THIS  At the [facility], you and your health needs are our priority. We have a designated team specialized in the treatment of Heart Failure. This Care Team includes your primary Heart Failure Specialized Cardiologist (physician), Advanced Practice Providers (APPs- Physician Assistants and Nurse Practitioners), and Pharmacist who all work together to provide you with the care you need, when you need it.   You may see any of the following providers on your designated Care Team at your next follow up:  Dr Glori Bickers Dr Haynes Kerns, NP Lyda Jester, [place] [facility] [place], [place] Audry Riles, PharmD   Please be sure to bring in all your medications bottles to every appointment.   Need to Contact us:  If you have any questions or concerns before your next appointment please send [place] a message through [place] or call our office at [phone].    TO LEAVE A MESSAGE FOR THE NURSE SELECT OPTION 2, PLEASE LEAVE A MESSAGE INCLUDING: YOUR NAME DATE OF BIRTH CALL BACK NUMBER REASON FOR CALL**this is important as we prioritize the call backs  YOU WILL RECEIVE A CALL BACK THE SAME DAY AS LONG AS YOU CALL BEFORE 4:00 PM

## 2021-04-22 NOTE — Progress Notes (Signed)
[facility] Note   PCP: [place] Cardiologist: Dr. Aundra Dubin EP- Dr. Caryl Comes   HPI: 60 y.o. with history of complete heart block s/p dual chamber Medtronic PPM 2/21, HTN, prior smoking/?COPD.  He was admitted in 2/21 with CHB and syncope, echo in 2/21 with EF 60-65%.  PPM was placed. Initially did ok after PPM.    In June 2021, he started to develop dyspnea and wheezing + cough.  He went to the ER, thought to have COPD exacerbation and started on albuterol and steroids.  He was back in the ER 1 week later with wheezing and hypoxia, was briefly on bipap and given antibiotics + a prolonged steroid taper. He returned to the ED on 11/16/19 this time with chest pain radiating to his back. A CT chest dissection protocol was performed that was notable for "new cardiomegaly" but not aortic pathology. There was no effusion on this CT scan. He was treated with dilaudid and toradol and discharged home pain free.    Shortly after, he developed cough and new orthopnea.  He then developed chest pain.  On 11/24/19, he went back to the ER and was admitted.  He was hypertensive, HS-TnI not elevated.  CTA chest showed no PE but there was a new moderate pericardial effusion.  Chest pain was pleuritic. CRP elevated 20, ESR 53. Started on colchicine and prednisone continued in setting of suspected myopericarditis. COVID negative, flu negative, RSV negative. On azithromycin/ceftriaxone for possible PNA.  Mildly elevated PCT at 0.33.  Echo was done, showing LV EF < 20%, RV moderately decreased systolic function, small-moderate pericardial effusion surrounding heart w/o evidence for tamponade.  RHC/LHC showed no coronary disease, normal filling pressures, normal cardiac output.  Cardiac MRI did not show evidence for myocarditis (did show evidence for pericarditis). There was no evidence for cardiac sarcoidosis.  Given no CAD on cath and no LGE on MRI, fall in EF may well be due to chronic RV pacing.   Patient was markedly dyssynchronous. Still not clear why the patient developed CHB. EP was consulted and recommended repeating echo in 4-6 weeks. If no improvement will need CRT upgrade.  He was placed on GDMT for systolic HF, Entresto, Spiro and bisoprolol. Lasix 20 mg daily for volume control. He was continued on colchicine 0.6 mg bid for pericarditis, to be continued x 3 months.  Repeat echo in 9/21 showed EF 20-25% with moderate LV dilation.  He was seen by Dr. Caryl Comes, had Medtronic CRT-D upgrade in 10/21.    Later in 10/21, he was admitted for COPD exacerbation and was treated with nebs and steroids.  Now on Spiriva and albuterol.  He stopped smoking in 2020.   Echo in 5/22 showed EF 55-60%, mild LVH, normal RV.   Today he returns for HF follow up.  He continues to do well.  Has not used Lasix.  Able to do yardwork with no problems.  No exertional dyspnea or chest pain.  No lightheadedness.  No orthopnea/PND. Weight down 9 lbs.   Labs (10/21): K 3.8, creatinine 1.07, HIV negative Labs (4/22): K 4.6, creatinine 1.3 Labs (10/22): K 4.4, creatinine 1.1  MDT device interrogation: >99% BiV pacing, stable thoracic impedance.   Review of Systems: All systems reviewed and negative except as noted in HPI.   PMH: 1. Hyperlipidemia 2. HTN 3. Prostate cancer 4. Pericarditis in 7/21.  5. Complete heart block: 2/21.  MDT PPM placed, later upgraded to CRT.  No evidence for sarcoidosis  on cardiac MRI.  6. Chronic systolic CHF: Nonischemic cardiomyopathy.  - Cardiac MRI (7/21): EF 20% with dyssynchrony, moderate RV dysfunction, no LGE, small pericardial effusion.  - Echo (9/21): EF 20-25% with septal-lateral dyssynchrony, moderate LV dilation, normal RV.  - Upgrade to MDT CRT-P device 10/21.  - Echo (12/21): EF 35%, mild LV dilation, normal RV function.  - Echo (5/22): EF 55-60%, mild LVH, normal RV   Current Outpatient Medications  Medication Sig Dispense Refill   acetaminophen (TYLENOL) 500  MG tablet Take 1,000 mg by mouth every 6 (six) hours as needed for mild pain or headache.      bisoprolol (ZEBETA) 10 MG tablet Take 1 tablet (10 mg total) by mouth daily. 90 tablet 3   cetirizine (ZYRTEC) 10 MG tablet Take 1 tablet by mouth as needed.     colchicine 0.6 MG tablet Take 1 tablet by mouth as needed. Gout     cyclobenzaprine (FLEXERIL) 10 MG tablet Take 10 mg by mouth at bedtime as needed for muscle spasms.      diclofenac sodium (VOLTAREN) 1 % GEL Apply 4 g topically 4 (four) times daily as needed (pain).      empagliflozin (JARDIANCE) 25 MG TABS tablet Take 12.5 mg (0.5 tablets) by mouth  daily 45 tablet 3   furosemide (LASIX) 20 MG tablet Take 1 tablet (20 mg total) by mouth daily as needed for fluid or edema. 30 tablet 5   HYDROcodone-acetaminophen (NORCO/VICODIN) 5-325 MG tablet Take 1 tablet by mouth 3 (three) times daily as needed (for pain).      Naphazoline HCl (CLEAR EYES OP) Place 1 drop into both eyes daily as needed (allergies).     omeprazole (PRILOSEC) 20 MG capsule Take 20 mg by mouth daily.     sacubitril-valsartan (ENTRESTO) 49-51 MG Take 0.5 tablets by mouth 2 (two) times daily.     simvastatin (ZOCOR) 20 MG tablet Take 10 mg by mouth at bedtime.     spironolactone (ALDACTONE) 25 MG tablet Take 1 tablet (25 mg total) by mouth daily. 30 tablet 5   tamsulosin (FLOMAX) 0.4 MG CAPS capsule Take 1 capsule (0.4 mg total) by mouth at bedtime. 30 capsule 0   Tiotropium Bromide Monohydrate 2.5 MCG/ACT AERS 2 puffs daily at 6 (six) AM.     No current facility-administered medications for this encounter.    No Known Allergies    Social History   Socioeconomic History   Marital status: Married    Spouse name: Not on file   Number of children: Not on file   Years of education: Not on file   Highest education level: Not on file  Occupational History   Not on file  Tobacco Use   Smoking status: Former    Types: Cigarettes   Smokeless tobacco: Never  Vaping Use    Vaping Use: Never used  Substance and Sexual Activity   Alcohol use: Yes    Alcohol/week: 6.0 standard drinks    Types: 6 Cans of beer per week    Comment: socially   Drug use: No   Sexual activity: Not on file  Other Topics Concern   Not on file  Social History Narrative   Not on file   Social Determinants of Health   Financial Resource Strain: Not on file  Food Insecurity: Not on file  Transportation Needs: Not on file  Physical Activity: Not on file  Stress: Not on file  Social Connections: Not on file  Intimate Partner  Violence: Not on file      Family History  Problem Relation Age of Onset   Prostate cancer Neg Hx     Vitals:   04/22/21 1110  BP: 104/60  Pulse: 70  SpO2: 96%  Weight: 87.9 kg (193 lb 12.8 oz)   Wt Readings from Last 3 Encounters:  04/22/21 87.9 kg (193 lb 12.8 oz)  04/09/21 87.1 kg (192 lb)  11/25/20 92 kg (202 lb 12.8 oz)     PHYSICAL EXAM: General: NAD Neck: No JVD, no thyromegaly or thyroid nodule.  Lungs: Clear to auscultation bilaterally with normal respiratory effort. CV: Nondisplaced PMI.  Heart regular S1/S2, no S3/S4, no murmur.  No peripheral edema.  No carotid bruit.  Normal pedal pulses.  Abdomen: Soft, nontender, no hepatosplenomegaly, no distention.  Skin: Intact without lesions or rashes.  Neurologic: Alert and oriented x 3.  Psych: Normal affect. Extremities: No clubbing or cyanosis.  HEENT: Normal.   ASSESSMENT & PLAN:  1. Chronic Systolic Heart Failure: Echo in 2/21 prior to PPM placement showed EF 60-65%. Echo 7/21 with LV EF < 20%, RV moderately decreased systolic function, small-moderate pericardial effusion surrounding heart w/o evidence for tamponade. In 7/21, patient had symptoms concerning for viral myopericarditis with pericardial effusion and pleuritic chest pain => however, troponin not significantly elevated, pointing away from a myocarditis component.  RHC/LHC showed no coronary disease, normal filling  pressures, normal cardiac output.  Nonischemic cardiomyopathy, but cause uncertain. Cardiac MRI did not show evidence for myocarditis (did show evidence for pericarditis). There was no evidence for cardiac sarcoidosis.  Given no CAD on cath and no LGE on MRI, fall in EF may well be due to chronic RV pacing.  Patient was markedly dyssynchronous => with repeat echo in 9/21 showing EF 20-25%, he was upgraded to MDT CRT-P.  By 5/22, EF was up to 55-60% => robust response to CRT. NYHA class I symptoms, he is not volume overloaded by exam or Optivol.   - Continue Entresto 24/26 mg bid.  - Continue Spironolactone 25 mg daily, BMET today.  - Continue empagliflozin 12.5 mg daily - Continue bisoprolol 10 mg daily.  - Followup in 6 months with echo to ensure EF remains normal.  2. Pericarditis: No further chest pain.  3. H/o CHB: s/p PPM 2/21, upgraded to CRT in 10/21.  4. COPD: Quit smoking in 2020.    Followup in 6 months with echo.   Loralie Champagne 04/22/2021

## 2021-05-07 ENCOUNTER — Other Ambulatory Visit: Payer: Self-pay

## 2021-06-05 ENCOUNTER — Ambulatory Visit (INDEPENDENT_AMBULATORY_CARE_PROVIDER_SITE_OTHER): Payer: No Typology Code available for payment source

## 2021-06-05 DIAGNOSIS — I442 Atrioventricular block, complete: Secondary | ICD-10-CM

## 2021-06-05 LAB — CUP PACEART REMOTE DEVICE CHECK
Battery Remaining Longevity: 65 mo
Battery Voltage: 2.99 V
Brady Statistic AP VP Percent: 1.58 %
Brady Statistic AP VS Percent: 0.01 %
Brady Statistic AS VP Percent: 98.1 %
Brady Statistic AS VS Percent: 0.32 %
Brady Statistic RA Percent Paced: 1.78 %
Brady Statistic RV Percent Paced: 99.67 %
Date Time Interrogation Session: 20230126220202
Implantable Lead Implant Date: 20210222
Implantable Lead Implant Date: 20210222
Implantable Lead Implant Date: 20211011
Implantable Lead Location: 753858
Implantable Lead Location: 753859
Implantable Lead Location: 753860
Implantable Lead Model: 5076
Implantable Lead Model: 5076
Implantable Pulse Generator Implant Date: 20211011
Lead Channel Impedance Value: 1007 Ohm
Lead Channel Impedance Value: 1064 Ohm
Lead Channel Impedance Value: 1083 Ohm
Lead Channel Impedance Value: 1140 Ohm
Lead Channel Impedance Value: 1273 Ohm
Lead Channel Impedance Value: 304 Ohm
Lead Channel Impedance Value: 304 Ohm
Lead Channel Impedance Value: 361 Ohm
Lead Channel Impedance Value: 418 Ohm
Lead Channel Impedance Value: 494 Ohm
Lead Channel Impedance Value: 570 Ohm
Lead Channel Impedance Value: 646 Ohm
Lead Channel Impedance Value: 741 Ohm
Lead Channel Impedance Value: 912 Ohm
Lead Channel Pacing Threshold Amplitude: 0.625 V
Lead Channel Pacing Threshold Amplitude: 0.875 V
Lead Channel Pacing Threshold Amplitude: 2.5 V
Lead Channel Pacing Threshold Pulse Width: 0.4 ms
Lead Channel Pacing Threshold Pulse Width: 0.4 ms
Lead Channel Pacing Threshold Pulse Width: 0.8 ms
Lead Channel Sensing Intrinsic Amplitude: 1.625 mV
Lead Channel Sensing Intrinsic Amplitude: 1.625 mV
Lead Channel Sensing Intrinsic Amplitude: 7.125 mV
Lead Channel Sensing Intrinsic Amplitude: 7.125 mV
Lead Channel Setting Pacing Amplitude: 1.5 V
Lead Channel Setting Pacing Amplitude: 2 V
Lead Channel Setting Pacing Amplitude: 3.5 V
Lead Channel Setting Pacing Pulse Width: 0.4 ms
Lead Channel Setting Pacing Pulse Width: 0.8 ms
Lead Channel Setting Sensing Sensitivity: 0.9 mV

## 2021-06-15 NOTE — Progress Notes (Signed)
Remote pacemaker transmission.   

## 2021-09-04 ENCOUNTER — Ambulatory Visit (INDEPENDENT_AMBULATORY_CARE_PROVIDER_SITE_OTHER): Payer: No Typology Code available for payment source

## 2021-09-04 DIAGNOSIS — I442 Atrioventricular block, complete: Secondary | ICD-10-CM | POA: Diagnosis not present

## 2021-09-04 LAB — CUP PACEART REMOTE DEVICE CHECK
Battery Remaining Longevity: 53 mo
Battery Voltage: 2.98 V
Brady Statistic AP VP Percent: 1.68 %
Brady Statistic AP VS Percent: 0.01 %
Brady Statistic AS VP Percent: 98.12 %
Brady Statistic AS VS Percent: 0.19 %
Brady Statistic RA Percent Paced: 1.81 %
Brady Statistic RV Percent Paced: 99.8 %
Date Time Interrogation Session: 20230427222851
Implantable Lead Implant Date: 20210222
Implantable Lead Implant Date: 20210222
Implantable Lead Implant Date: 20211011
Implantable Lead Location: 753858
Implantable Lead Location: 753859
Implantable Lead Location: 753860
Implantable Lead Model: 5076
Implantable Lead Model: 5076
Implantable Pulse Generator Implant Date: 20211011
Lead Channel Impedance Value: 1026 Ohm
Lead Channel Impedance Value: 1045 Ohm
Lead Channel Impedance Value: 1064 Ohm
Lead Channel Impedance Value: 1102 Ohm
Lead Channel Impedance Value: 1254 Ohm
Lead Channel Impedance Value: 304 Ohm
Lead Channel Impedance Value: 304 Ohm
Lead Channel Impedance Value: 380 Ohm
Lead Channel Impedance Value: 418 Ohm
Lead Channel Impedance Value: 494 Ohm
Lead Channel Impedance Value: 551 Ohm
Lead Channel Impedance Value: 665 Ohm
Lead Channel Impedance Value: 684 Ohm
Lead Channel Impedance Value: 931 Ohm
Lead Channel Pacing Threshold Amplitude: 0.5 V
Lead Channel Pacing Threshold Amplitude: 1 V
Lead Channel Pacing Threshold Amplitude: 3 V
Lead Channel Pacing Threshold Pulse Width: 0.4 ms
Lead Channel Pacing Threshold Pulse Width: 0.4 ms
Lead Channel Pacing Threshold Pulse Width: 0.8 ms
Lead Channel Sensing Intrinsic Amplitude: 0.375 mV
Lead Channel Sensing Intrinsic Amplitude: 0.375 mV
Lead Channel Sensing Intrinsic Amplitude: 7.125 mV
Lead Channel Sensing Intrinsic Amplitude: 7.125 mV
Lead Channel Setting Pacing Amplitude: 1.5 V
Lead Channel Setting Pacing Amplitude: 2 V
Lead Channel Setting Pacing Amplitude: 4 V
Lead Channel Setting Pacing Pulse Width: 0.4 ms
Lead Channel Setting Pacing Pulse Width: 0.8 ms
Lead Channel Setting Sensing Sensitivity: 0.9 mV

## 2021-09-18 NOTE — Progress Notes (Signed)
Remote pacemaker transmission.   

## 2021-12-04 ENCOUNTER — Ambulatory Visit (INDEPENDENT_AMBULATORY_CARE_PROVIDER_SITE_OTHER): Payer: No Typology Code available for payment source

## 2021-12-04 DIAGNOSIS — I442 Atrioventricular block, complete: Secondary | ICD-10-CM | POA: Diagnosis not present

## 2021-12-04 LAB — CUP PACEART REMOTE DEVICE CHECK
Battery Remaining Longevity: 62 mo
Battery Voltage: 2.98 V
Brady Statistic AP VP Percent: 1.66 %
Brady Statistic AP VS Percent: 0.01 %
Brady Statistic AS VP Percent: 98.27 %
Brady Statistic AS VS Percent: 0.06 %
Brady Statistic RA Percent Paced: 1.7 %
Brady Statistic RV Percent Paced: 99.92 %
Date Time Interrogation Session: 20230727233037
Implantable Lead Implant Date: 20210222
Implantable Lead Implant Date: 20210222
Implantable Lead Implant Date: 20211011
Implantable Lead Location: 753858
Implantable Lead Location: 753859
Implantable Lead Location: 753860
Implantable Lead Model: 5076
Implantable Lead Model: 5076
Implantable Pulse Generator Implant Date: 20211011
Lead Channel Impedance Value: 1045 Ohm
Lead Channel Impedance Value: 1083 Ohm
Lead Channel Impedance Value: 1083 Ohm
Lead Channel Impedance Value: 1159 Ohm
Lead Channel Impedance Value: 1292 Ohm
Lead Channel Impedance Value: 304 Ohm
Lead Channel Impedance Value: 323 Ohm
Lead Channel Impedance Value: 380 Ohm
Lead Channel Impedance Value: 418 Ohm
Lead Channel Impedance Value: 494 Ohm
Lead Channel Impedance Value: 551 Ohm
Lead Channel Impedance Value: 703 Ohm
Lead Channel Impedance Value: 703 Ohm
Lead Channel Impedance Value: 950 Ohm
Lead Channel Pacing Threshold Amplitude: 0.625 V
Lead Channel Pacing Threshold Amplitude: 0.875 V
Lead Channel Pacing Threshold Amplitude: 2.5 V
Lead Channel Pacing Threshold Pulse Width: 0.4 ms
Lead Channel Pacing Threshold Pulse Width: 0.4 ms
Lead Channel Pacing Threshold Pulse Width: 0.8 ms
Lead Channel Sensing Intrinsic Amplitude: 1 mV
Lead Channel Sensing Intrinsic Amplitude: 1 mV
Lead Channel Sensing Intrinsic Amplitude: 7.125 mV
Lead Channel Sensing Intrinsic Amplitude: 7.125 mV
Lead Channel Setting Pacing Amplitude: 1.5 V
Lead Channel Setting Pacing Amplitude: 2 V
Lead Channel Setting Pacing Amplitude: 3.5 V
Lead Channel Setting Pacing Pulse Width: 0.4 ms
Lead Channel Setting Pacing Pulse Width: 0.8 ms
Lead Channel Setting Sensing Sensitivity: 0.9 mV

## 2021-12-11 ENCOUNTER — Telehealth: Payer: Self-pay | Admitting: Internal Medicine

## 2021-12-11 NOTE — Telephone Encounter (Signed)
Patient has been experiencing a cramp like feeling at the bottom of his rib cage. He stated that he start on yesterday. Please advise

## 2021-12-11 NOTE — Telephone Encounter (Signed)
Spoke with pt who complains of intermittent dull pain at the bottom of his rib cage on the left side.  Pt denies nausea and vomiting, sweating, dizziness or SOB.  He states he did left a case of soda yesterday and the pain started after that but does not feel it is muscular.  He rates apin as a 4 or 5.  He states he did take a Tylenol this morning but can't say that it has helped.  He reports his BP has been WNL.   Pt advised pain does not sound cardiac in nature but to continue to monitor symptoms.  Recommended he should also contact his PCP re: pain.  Continue current medications and refrain from heavy lifting.  Next appointment is scheduled for 03/2022 but may call if needs to move up appointment. Pt verbalizes understanding and agrees with current plan.

## 2021-12-23 NOTE — Progress Notes (Signed)
Remote pacemaker transmission.   

## 2022-04-06 ENCOUNTER — Encounter: Payer: No Typology Code available for payment source | Admitting: Internal Medicine

## 2022-06-04 ENCOUNTER — Ambulatory Visit (INDEPENDENT_AMBULATORY_CARE_PROVIDER_SITE_OTHER): Payer: Self-pay

## 2022-06-04 DIAGNOSIS — I442 Atrioventricular block, complete: Secondary | ICD-10-CM

## 2022-06-04 LAB — CUP PACEART REMOTE DEVICE CHECK
Battery Remaining Longevity: 72 mo
Battery Voltage: 2.98 V
Brady Statistic AP VP Percent: 2.52 %
Brady Statistic AP VS Percent: 0.01 %
Brady Statistic AS VP Percent: 97.46 %
Brady Statistic AS VS Percent: 0.01 %
Brady Statistic RA Percent Paced: 2.53 %
Brady Statistic RV Percent Paced: 99.98 %
Date Time Interrogation Session: 20240125205356
Implantable Lead Connection Status: 753985
Implantable Lead Connection Status: 753985
Implantable Lead Connection Status: 753985
Implantable Lead Implant Date: 20210222
Implantable Lead Implant Date: 20210222
Implantable Lead Implant Date: 20211011
Implantable Lead Location: 753858
Implantable Lead Location: 753859
Implantable Lead Location: 753860
Implantable Lead Model: 5076
Implantable Lead Model: 5076
Implantable Pulse Generator Implant Date: 20211011
Lead Channel Impedance Value: 1064 Ohm
Lead Channel Impedance Value: 1121 Ohm
Lead Channel Impedance Value: 1140 Ohm
Lead Channel Impedance Value: 1216 Ohm
Lead Channel Impedance Value: 1349 Ohm
Lead Channel Impedance Value: 323 Ohm
Lead Channel Impedance Value: 323 Ohm
Lead Channel Impedance Value: 380 Ohm
Lead Channel Impedance Value: 418 Ohm
Lead Channel Impedance Value: 513 Ohm
Lead Channel Impedance Value: 570 Ohm
Lead Channel Impedance Value: 703 Ohm
Lead Channel Impedance Value: 741 Ohm
Lead Channel Impedance Value: 969 Ohm
Lead Channel Pacing Threshold Amplitude: 0.5 V
Lead Channel Pacing Threshold Amplitude: 0.875 V
Lead Channel Pacing Threshold Amplitude: 2.25 V
Lead Channel Pacing Threshold Pulse Width: 0.4 ms
Lead Channel Pacing Threshold Pulse Width: 0.4 ms
Lead Channel Pacing Threshold Pulse Width: 0.8 ms
Lead Channel Sensing Intrinsic Amplitude: 1.125 mV
Lead Channel Sensing Intrinsic Amplitude: 1.125 mV
Lead Channel Sensing Intrinsic Amplitude: 3.375 mV
Lead Channel Sensing Intrinsic Amplitude: 3.375 mV
Lead Channel Setting Pacing Amplitude: 1.5 V
Lead Channel Setting Pacing Amplitude: 2 V
Lead Channel Setting Pacing Amplitude: 2.75 V
Lead Channel Setting Pacing Pulse Width: 0.4 ms
Lead Channel Setting Pacing Pulse Width: 0.8 ms
Lead Channel Setting Sensing Sensitivity: 0.9 mV
Zone Setting Status: 755011

## 2022-06-21 NOTE — Progress Notes (Signed)
Remote pacemaker transmission.   

## 2022-09-03 ENCOUNTER — Ambulatory Visit (INDEPENDENT_AMBULATORY_CARE_PROVIDER_SITE_OTHER): Payer: Self-pay

## 2022-09-03 DIAGNOSIS — I442 Atrioventricular block, complete: Secondary | ICD-10-CM

## 2022-09-03 LAB — CUP PACEART REMOTE DEVICE CHECK
Battery Remaining Longevity: 69 mo
Battery Voltage: 2.97 V
Brady Statistic AP VP Percent: 3.5 %
Brady Statistic AP VS Percent: 0.01 %
Brady Statistic AS VP Percent: 96.49 %
Brady Statistic AS VS Percent: 0 %
Brady Statistic RA Percent Paced: 3.5 %
Brady Statistic RV Percent Paced: 99.98 %
Date Time Interrogation Session: 20240425211723
Implantable Lead Connection Status: 753985
Implantable Lead Connection Status: 753985
Implantable Lead Connection Status: 753985
Implantable Lead Implant Date: 20210222
Implantable Lead Implant Date: 20210222
Implantable Lead Implant Date: 20211011
Implantable Lead Location: 753858
Implantable Lead Location: 753859
Implantable Lead Location: 753860
Implantable Lead Model: 5076
Implantable Lead Model: 5076
Implantable Pulse Generator Implant Date: 20211011
Lead Channel Impedance Value: 1102 Ohm
Lead Channel Impedance Value: 1102 Ohm
Lead Channel Impedance Value: 1140 Ohm
Lead Channel Impedance Value: 1159 Ohm
Lead Channel Impedance Value: 1349 Ohm
Lead Channel Impedance Value: 323 Ohm
Lead Channel Impedance Value: 323 Ohm
Lead Channel Impedance Value: 399 Ohm
Lead Channel Impedance Value: 418 Ohm
Lead Channel Impedance Value: 532 Ohm
Lead Channel Impedance Value: 532 Ohm
Lead Channel Impedance Value: 722 Ohm
Lead Channel Impedance Value: 741 Ohm
Lead Channel Impedance Value: 931 Ohm
Lead Channel Pacing Threshold Amplitude: 0.625 V
Lead Channel Pacing Threshold Amplitude: 0.75 V
Lead Channel Pacing Threshold Amplitude: 2.25 V
Lead Channel Pacing Threshold Pulse Width: 0.4 ms
Lead Channel Pacing Threshold Pulse Width: 0.4 ms
Lead Channel Pacing Threshold Pulse Width: 0.8 ms
Lead Channel Sensing Intrinsic Amplitude: 0.75 mV
Lead Channel Sensing Intrinsic Amplitude: 0.75 mV
Lead Channel Sensing Intrinsic Amplitude: 3.375 mV
Lead Channel Sensing Intrinsic Amplitude: 3.375 mV
Lead Channel Setting Pacing Amplitude: 1.5 V
Lead Channel Setting Pacing Amplitude: 2 V
Lead Channel Setting Pacing Amplitude: 2.75 V
Lead Channel Setting Pacing Pulse Width: 0.4 ms
Lead Channel Setting Pacing Pulse Width: 0.8 ms
Lead Channel Setting Sensing Sensitivity: 0.9 mV
Zone Setting Status: 755011

## 2022-09-28 NOTE — Progress Notes (Signed)
Remote pacemaker transmission.   

## 2022-12-03 ENCOUNTER — Ambulatory Visit (INDEPENDENT_AMBULATORY_CARE_PROVIDER_SITE_OTHER): Payer: Self-pay

## 2022-12-03 DIAGNOSIS — I442 Atrioventricular block, complete: Secondary | ICD-10-CM

## 2022-12-03 LAB — CUP PACEART REMOTE DEVICE CHECK

## 2022-12-10 NOTE — Progress Notes (Signed)
Remote pacemaker transmission.   

## 2023-03-04 ENCOUNTER — Ambulatory Visit (INDEPENDENT_AMBULATORY_CARE_PROVIDER_SITE_OTHER): Payer: Self-pay

## 2023-03-04 DIAGNOSIS — I442 Atrioventricular block, complete: Secondary | ICD-10-CM

## 2023-03-07 LAB — CUP PACEART REMOTE DEVICE CHECK
Battery Remaining Longevity: 53 mo
Battery Voltage: 2.96 V
Brady Statistic AP VP Percent: 4.13 %
Brady Statistic AP VS Percent: 0.01 %
Brady Statistic AS VP Percent: 95.86 %
Brady Statistic AS VS Percent: 0 %
Brady Statistic RA Percent Paced: 4.13 %
Brady Statistic RV Percent Paced: 99.99 %
Date Time Interrogation Session: 20241024235424
Implantable Lead Connection Status: 753985
Implantable Lead Connection Status: 753985
Implantable Lead Connection Status: 753985
Implantable Lead Implant Date: 20210222
Implantable Lead Implant Date: 20210222
Implantable Lead Implant Date: 20211011
Implantable Lead Location: 753858
Implantable Lead Location: 753859
Implantable Lead Location: 753860
Implantable Lead Model: 5076
Implantable Lead Model: 5076
Implantable Pulse Generator Implant Date: 20211011
Lead Channel Impedance Value: 1045 Ohm
Lead Channel Impedance Value: 1064 Ohm
Lead Channel Impedance Value: 1083 Ohm
Lead Channel Impedance Value: 1102 Ohm
Lead Channel Impedance Value: 1292 Ohm
Lead Channel Impedance Value: 304 Ohm
Lead Channel Impedance Value: 323 Ohm
Lead Channel Impedance Value: 380 Ohm
Lead Channel Impedance Value: 399 Ohm
Lead Channel Impedance Value: 513 Ohm
Lead Channel Impedance Value: 513 Ohm
Lead Channel Impedance Value: 684 Ohm
Lead Channel Impedance Value: 703 Ohm
Lead Channel Impedance Value: 912 Ohm
Lead Channel Pacing Threshold Amplitude: 0.625 V
Lead Channel Pacing Threshold Amplitude: 0.75 V
Lead Channel Pacing Threshold Amplitude: 2.125 V
Lead Channel Pacing Threshold Pulse Width: 0.4 ms
Lead Channel Pacing Threshold Pulse Width: 0.4 ms
Lead Channel Pacing Threshold Pulse Width: 0.8 ms
Lead Channel Sensing Intrinsic Amplitude: 1.5 mV
Lead Channel Sensing Intrinsic Amplitude: 1.5 mV
Lead Channel Sensing Intrinsic Amplitude: 3.375 mV
Lead Channel Sensing Intrinsic Amplitude: 3.375 mV
Lead Channel Setting Pacing Amplitude: 1.5 V
Lead Channel Setting Pacing Amplitude: 2 V
Lead Channel Setting Pacing Amplitude: 3.25 V
Lead Channel Setting Pacing Pulse Width: 0.4 ms
Lead Channel Setting Pacing Pulse Width: 0.8 ms
Lead Channel Setting Sensing Sensitivity: 0.9 mV
Zone Setting Status: 755011

## 2023-03-22 NOTE — Progress Notes (Signed)
Remote pacemaker transmission.   

## 2023-05-01 ENCOUNTER — Emergency Department (HOSPITAL_COMMUNITY): Payer: No Typology Code available for payment source

## 2023-05-01 ENCOUNTER — Emergency Department (HOSPITAL_COMMUNITY)
Admission: EM | Admit: 2023-05-01 | Discharge: 2023-05-01 | Disposition: A | Discharge status: Home / Self Care | Payer: No Typology Code available for payment source | Attending: Emergency Medicine | Admitting: Emergency Medicine

## 2023-05-01 DIAGNOSIS — Z950 Presence of cardiac pacemaker: Secondary | ICD-10-CM | POA: Diagnosis not present

## 2023-05-01 DIAGNOSIS — Z79899 Other long term (current) drug therapy: Secondary | ICD-10-CM | POA: Diagnosis not present

## 2023-05-01 DIAGNOSIS — R072 Precordial pain: Secondary | ICD-10-CM | POA: Diagnosis present

## 2023-05-01 LAB — CBC
HCT: 45 % (ref 39.0–52.0)
Hemoglobin: 14.9 g/dL (ref 13.0–17.0)
MCH: 28.7 pg (ref 26.0–34.0)
MCHC: 33.1 g/dL (ref 30.0–36.0)
MCV: 86.7 fL (ref 80.0–100.0)
Platelets: 278 10*3/uL (ref 150–400)
RBC: 5.19 MIL/uL (ref 4.22–5.81)
RDW: 14.2 % (ref 11.5–15.5)
WBC: 8.9 10*3/uL (ref 4.0–10.5)
nRBC: 0 % (ref 0.0–0.2)

## 2023-05-01 LAB — BASIC METABOLIC PANEL
Anion gap: 10 (ref 5–15)
BUN: 9 mg/dL (ref 8–23)
CO2: 24 mmol/L (ref 22–32)
Calcium: 9.5 mg/dL (ref 8.9–10.3)
Chloride: 101 mmol/L (ref 98–111)
Creatinine, Ser: 1.15 mg/dL (ref 0.61–1.24)
GFR, Estimated: >60 mL/min (ref >60)
Glucose, Bld: 80 mg/dL (ref 70–99)
Potassium: 4.1 mmol/L (ref 3.5–5.1)
Sodium: 135 mmol/L (ref 135–145)

## 2023-05-01 LAB — TROPONIN I (HIGH SENSITIVITY): Troponin I (High Sensitivity): 7 ng/L (ref <18)

## 2023-05-01 MED ORDER — ALUM & MAG HYDROXIDE-SIMETH 200-200-20 MG/5ML PO SUSP
30.0000 mL | Freq: Once | ORAL | Status: AC
  Administered 2023-05-01: 30 mL via ORAL
  Filled 2023-05-01: qty 30

## 2023-05-01 MED ORDER — ACETAMINOPHEN 500 MG PO TABS
1000.0000 mg | ORAL_TABLET | Freq: Once | ORAL | Status: AC
Start: 2023-05-01 — End: 2023-05-01
  Administered 2023-05-01: 1000 mg via ORAL
  Filled 2023-05-01: qty 2

## 2023-05-01 MED ORDER — FAMOTIDINE 20 MG PO TABS
20.0000 mg | ORAL_TABLET | Freq: Once | ORAL | Status: AC
Start: 2023-05-01 — End: 2023-05-01
  Administered 2023-05-01: 20 mg via ORAL
  Filled 2023-05-01: qty 1

## 2023-05-01 NOTE — ED Notes (Signed)
RN contacted [employer] representative for results.   Dr. Denton Lank provided [employer] fax documentation.

## 2023-05-01 NOTE — ED Notes (Signed)
Pt provided water.  

## 2023-05-01 NOTE — ED Provider Notes (Addendum)
[place] EMERGENCY DEPARTMENT AT [facility] Provider Note   CSN: [id number] Arrival date & time: 05/01/23  1012     History  Chief Complaint  Patient presents with   Chest Pain    Glenn Brandt is a 62 y.o. male.  Pt c/o mid/midline chest discomfort in past week. Symptoms occur at rest, sharp, non radiating. No specific exacerbating or alleviating factors although occasionally brought on by certain movements/positional change. Denies pleuritic pain. No heartburn. No chest wall injury or strain. No cough or uri symptoms. No fever or chills. No change whether upright or supine. No recent febrile/viral illness. Denies associated nv, diaphoresis or sob. No leg pain or swelling. No recent trauma, travel/immobility or surgery. No hx dvt or pe. Prior cardiac cath 2021 neg for significant cad.   The history is provided by the patient and medical records.  Chest Pain Associated symptoms: no abdominal pain, no back pain, no cough, no fever, no headache, no nausea, no palpitations, no shortness of breath and no vomiting        Home Medications Prior to Admission medications   Medication Sig Start Date End Date Taking? Authorizing Provider  acetaminophen (TYLENOL) 500 MG tablet Take 1,000 mg by mouth every 6 (six) hours as needed for mild pain or headache.     [provider]  bisoprolol (ZEBETA) 10 MG tablet Take 1 tablet (10 mg total) by mouth daily. 03/25/21   Clegg, Amy D, NP  cetirizine (ZYRTEC) 10 MG tablet Take 1 tablet by mouth as needed. 09/06/19   [provider]  colchicine 0.6 MG tablet Take 1 tablet by mouth as needed. Gout 12/08/20   [provider]  cyclobenzaprine (FLEXERIL) 10 MG tablet Take 10 mg by mouth at bedtime as needed for muscle spasms.     [provider]  diclofenac sodium (VOLTAREN) 1 % GEL Apply 4 g topically 4 (four) times daily as needed (pain).     [provider]  empagliflozin (JARDIANCE) 25 MG TABS  tablet Take 12.5 mg (0.5 tablets) by mouth  daily 01/18/20   Laurey Morale, MD  furosemide (LASIX) 20 MG tablet Take 1 tablet (20 mg total) by mouth daily as needed for fluid or edema. 12/31/19   Allayne Butcher, PA-C  HYDROcodone-acetaminophen (NORCO/VICODIN) 5-325 MG tablet Take 1 tablet by mouth 3 (three) times daily as needed (for pain).     [provider]  Naphazoline HCl (CLEAR EYES OP) Place 1 drop into both eyes daily as needed (allergies).    [provider]  omeprazole (PRILOSEC) 20 MG capsule Take 20 mg by mouth daily.    [provider]  sacubitril-valsartan (ENTRESTO) 49-51 MG Take 0.5 tablets by mouth 2 (two) times daily.    [provider]  simvastatin (ZOCOR) 20 MG tablet Take 10 mg by mouth at bedtime.    [provider]  spironolactone (ALDACTONE) 25 MG tablet Take 1 tablet (25 mg total) by mouth daily. 06/23/20   Allayne Butcher, PA-C  tamsulosin (FLOMAX) 0.4 MG CAPS capsule Take 1 capsule (0.4 mg total) by mouth at bedtime. 11/29/19   Robbie Lis M, PA-C  Tiotropium Bromide Monohydrate 2.5 MCG/ACT AERS 2 puffs daily at 6 (six) AM. 04/07/21   [provider]      Allergies    Patient has no known allergies.    Review of Systems   Review of Systems  Constitutional:  Negative for chills and fever.  HENT:  Negative  for sore throat.   Eyes:  Negative for redness.  Respiratory:  Negative for cough and shortness of breath.   Cardiovascular:  Positive for chest pain. Negative for palpitations and leg swelling.  Gastrointestinal:  Negative for abdominal pain, nausea and vomiting.  Genitourinary:  Negative for flank pain.  Musculoskeletal:  Negative for back pain and neck pain.  Skin:  Negative for rash.  Neurological:  Negative for headaches.  Hematological:  Does not bruise/bleed easily.  Psychiatric/Behavioral:  Negative for confusion.     Physical Exam Updated Vital Signs BP 95/70 (BP Location: Right  Leg)   Pulse 71   Temp 98.3 F (36.8 C) (Oral)   Resp 16   SpO2 91%  Physical Exam Vitals and nursing note reviewed.  Constitutional:      Appearance: Normal appearance. He is well-developed.  HENT:     Head: Atraumatic.     Nose: Nose normal.     Mouth/Throat:     Mouth: Mucous membranes are moist.  Eyes:     General: No scleral icterus.    Conjunctiva/sclera: Conjunctivae normal.  Neck:     Trachea: No tracheal deviation.  Cardiovascular:     Rate and Rhythm: Normal rate and regular rhythm.     Pulses: Normal pulses.     Heart sounds: Normal heart sounds. No murmur heard.    No friction rub. No gallop.  Pulmonary:     Effort: Pulmonary effort is normal. No accessory muscle usage or respiratory distress.     Breath sounds: Normal breath sounds.     Comments: Mild chest wall tenderness reproducing symptoms. No swelling. No crepitus. No skin changes, redness or cellulitis. Area around pacemaker looks normal, no sign of infection.  Abdominal:     General: There is no distension.     Palpations: Abdomen is soft.     Tenderness: There is no abdominal tenderness.  Musculoskeletal:        General: No swelling or tenderness.     Cervical back: Normal range of motion and neck supple. No rigidity.     Right lower leg: No edema.     Left lower leg: No edema.  Skin:    General: Skin is warm and dry.     Findings: No rash.  Neurological:     Mental Status: He is alert.     Comments: Alert, speech clear. Steady gait.   Psychiatric:        Mood and Affect: Mood normal.     ED Results / Procedures / Treatments   Labs (all labs ordered are listed, but only abnormal results are displayed) Results for orders placed or performed during the hospital encounter of 05/01/23  Basic metabolic panel   Collection Time: 05/01/23 10:31 AM  Result Value Ref Range   Sodium 135 135 - 145 mmol/L   Potassium 4.1 3.5 - 5.1 mmol/L   Chloride 101 98 - 111 mmol/L   CO2 24 22 - 32 mmol/L    Glucose, Bld 80 70 - 99 mg/dL   BUN 9 8 - 23 mg/dL   Creatinine, Ser 4.09 0.61 - 1.24 mg/dL   Calcium 9.5 8.9 - 81.1 mg/dL   GFR, Estimated >91 >47 mL/min   Anion gap 10 5 - 15  CBC   Collection Time: 05/01/23 10:31 AM  Result Value Ref Range   WBC 8.9 4.0 - 10.5 K/uL   RBC 5.19 4.22 - 5.81 MIL/uL   Hemoglobin 14.9 13.0 - 17.0 g/dL  HCT 45.0 39.0 - 52.0 %   MCV 86.7 80.0 - 100.0 fL   MCH 28.7 26.0 - 34.0 pg   MCHC 33.1 30.0 - 36.0 g/dL   RDW 13.0 86.5 - 78.4 %   Platelets 278 150 - 400 K/uL   nRBC 0.0 0.0 - 0.2 %  Troponin I (High Sensitivity)   Collection Time: 05/01/23 10:31 AM  Result Value Ref Range   Troponin I (High Sensitivity) 7 <18 ng/L   DG Chest 2 View Result Date: 05/01/2023 CLINICAL DATA:  Chest pain EXAM: CHEST - 2 VIEW COMPARISON:  03/05/2020 chest radiograph. FINDINGS: Stable configuration of 3 lead left subclavian pacemaker. Stable cardiomediastinal silhouette with normal heart size. No pneumothorax. No pleural effusion. Lungs appear clear, with no acute consolidative airspace disease and no pulmonary edema. IMPRESSION: No active cardiopulmonary disease. Electronically Signed   By: Delbert Phenix M.D.   On: 05/01/2023 11:07    EKG EKG Interpretation Date/Time:  Sunday May 01 2023 10:22:09 EST Ventricular Rate:  73 PR Interval:  140 QRS Duration:  164 QT Interval:  488 QTC Calculation: 537 R Axis:   -53  Text Interpretation: Atrial-sensed ventricular-paced rhythm When compared with ECG of 05-May-2020 09:09, PREVIOUS ECG IS PRESENT Confirmed by Cathren Laine ([id number]) on 05/01/2023 11:37:42 AM  Radiology DG Chest 2 View Result Date: 05/01/2023 CLINICAL DATA:  Chest pain EXAM: CHEST - 2 VIEW COMPARISON:  03/05/2020 chest radiograph. FINDINGS: Stable configuration of 3 lead left subclavian pacemaker. Stable cardiomediastinal silhouette with normal heart size. No pneumothorax. No pleural effusion. Lungs appear clear, with no acute consolidative airspace  disease and no pulmonary edema. IMPRESSION: No active cardiopulmonary disease. Electronically Signed   By: Delbert Phenix M.D.   On: 05/01/2023 11:07    Procedures Procedures    Medications Ordered in ED Medications  alum & mag hydroxide-simeth (MAALOX/MYLANTA) 200-200-20 MG/5ML suspension 30 mL (has no administration in time range)  famotidine (PEPCID) tablet 20 mg (has no administration in time range)  acetaminophen (TYLENOL) tablet 1,000 mg (has no administration in time range)    ED Course/ Medical Decision Making/ A&P                                 Medical Decision Making Problems Addressed: History of cardiac pacemaker: chronic illness or injury Precordial chest pain: acute illness or injury with systemic symptoms that poses a threat to life or bodily functions  Amount and/or Complexity of Data Reviewed External Data Reviewed: labs, radiology and notes. Labs: ordered. Decision-making details documented in ED Course. Radiology: ordered and independent interpretation performed. Decision-making details documented in ED Course. ECG/medicine tests: ordered and independent interpretation performed. Decision-making details documented in ED Course.  Risk OTC drugs. Decision regarding hospitalization.   Iv ns. Continuous pulse ox and cardiac monitoring. Labs ordered/sent. Imaging ordered.   Differential diagnosis includes ACS, msk cp, gi cp, etc. Dispo decision including potential need for admission considered - will get labs and imaging and reassess.   Reviewed nursing notes and prior charts for additional history. External reports reviewed.   Cardiac monitor: sinus rhythm, rate 68.  Labs reviewed/interpreted by me - trop normal - after symptoms present for past week, felt not c/w acs. Wbc and hgb normal. Chem normal.   Acetaminophen, pepcid, maalox for symptom relief.   Xrays reviewed/interpreted by me - no pna.   Pt currently appears comfortable and stable for d/c. Rec  close cardiology f/u -  referral provided.  Pacemaker interrogated - rep/report indicates functioning normally, no issues.   Recheck no cp, sob or increased wob. Pt appears stable for d/c.   Return precautions provided.          Final Clinical Impression(s) / ED Diagnoses Final diagnoses:  None    Rx / DC Orders ED Discharge Orders     None         Cathren Laine, MD 05/01/23 1407

## 2023-05-01 NOTE — Discharge Instructions (Signed)
It was our pleasure to provide your ER care today - we hope that you feel better.   You may take acetaminophen or ibuprofen as need.  If GI/reflux symptoms, you may try pepcid and maalox as need.   Follow up closely with cardiologist in the coming week - we have made referral and they should be contacting you with an appointment in the next 1-2 days.   Return to ER if worse, new symptoms, fevers, recurrent/persistent chest pain, increased trouble breathing, weak/fainting, or other concern.

## 2023-05-01 NOTE — ED Notes (Signed)
RN spoke to [employer] representative. She stated the device was last interrogated 03/03/2023 since then no arrhythmias or malfunctions. Device is working as it suppose to.

## 2023-05-01 NOTE — ED Notes (Signed)
Pt states he doesn't feel comfortable going home. Pt says prior to pacemaker being placed he was sent home twice d/t results looking fine. On his third trip back that is when he was told something was wrong with his heart and the pacemaker was placed. Pt says he just doesn't feel right even though lab and EKG workup is reassuring. RN notified Dr. Denton Lank

## 2023-05-01 NOTE — ED Triage Notes (Signed)
Pt reports one week of midsternal non-radiating CP with SOB. Pt states pain is worse with exertion. Pt has pacemaker and is currently V-paced. States he feels like his pacemaker "is getting hot". Endorses dizziness upon standing.

## 2023-06-03 ENCOUNTER — Ambulatory Visit (INDEPENDENT_AMBULATORY_CARE_PROVIDER_SITE_OTHER): Payer: Self-pay

## 2023-06-03 DIAGNOSIS — I442 Atrioventricular block, complete: Secondary | ICD-10-CM | POA: Diagnosis not present

## 2023-06-04 LAB — CUP PACEART REMOTE DEVICE CHECK
Battery Remaining Longevity: 44 mo
Battery Voltage: 2.95 V
Brady Statistic AP VP Percent: 1.09 %
Brady Statistic AP VS Percent: 0.01 %
Brady Statistic AS VP Percent: 98.9 %
Brady Statistic AS VS Percent: 0 %
Brady Statistic RA Percent Paced: 1.1 %
Brady Statistic RV Percent Paced: 99.98 %
Date Time Interrogation Session: 20250123193754
Implantable Lead Connection Status: 753985
Implantable Lead Connection Status: 753985
Implantable Lead Connection Status: 753985
Implantable Lead Implant Date: 20210222
Implantable Lead Implant Date: 20210222
Implantable Lead Implant Date: 20211011
Implantable Lead Location: 753858
Implantable Lead Location: 753859
Implantable Lead Location: 753860
Implantable Lead Model: 5076
Implantable Lead Model: 5076
Implantable Pulse Generator Implant Date: 20211011
Lead Channel Impedance Value: 1026 Ohm
Lead Channel Impedance Value: 1045 Ohm
Lead Channel Impedance Value: 1178 Ohm
Lead Channel Impedance Value: 285 Ohm
Lead Channel Impedance Value: 304 Ohm
Lead Channel Impedance Value: 380 Ohm
Lead Channel Impedance Value: 399 Ohm
Lead Channel Impedance Value: 494 Ohm
Lead Channel Impedance Value: 494 Ohm
Lead Channel Impedance Value: 627 Ohm
Lead Channel Impedance Value: 646 Ohm
Lead Channel Impedance Value: 855 Ohm
Lead Channel Impedance Value: 950 Ohm
Lead Channel Impedance Value: 969 Ohm
Lead Channel Pacing Threshold Amplitude: 0.5 V
Lead Channel Pacing Threshold Amplitude: 0.875 V
Lead Channel Pacing Threshold Amplitude: 1.75 V
Lead Channel Pacing Threshold Pulse Width: 0.4 ms
Lead Channel Pacing Threshold Pulse Width: 0.4 ms
Lead Channel Pacing Threshold Pulse Width: 0.8 ms
Lead Channel Sensing Intrinsic Amplitude: 0.625 mV
Lead Channel Sensing Intrinsic Amplitude: 0.625 mV
Lead Channel Sensing Intrinsic Amplitude: 3.375 mV
Lead Channel Sensing Intrinsic Amplitude: 3.375 mV
Lead Channel Setting Pacing Amplitude: 1.5 V
Lead Channel Setting Pacing Amplitude: 2 V
Lead Channel Setting Pacing Amplitude: 3.5 V
Lead Channel Setting Pacing Pulse Width: 0.4 ms
Lead Channel Setting Pacing Pulse Width: 0.8 ms
Lead Channel Setting Sensing Sensitivity: 0.9 mV
Zone Setting Status: 755011

## 2023-07-13 NOTE — Addendum Note (Signed)
 Addended by: Elease Etienne A on: 07/13/2023 12:18 PM   Modules accepted: Orders

## 2023-07-13 NOTE — Progress Notes (Signed)
 Remote pacemaker transmission.

## 2023-09-02 ENCOUNTER — Ambulatory Visit (INDEPENDENT_AMBULATORY_CARE_PROVIDER_SITE_OTHER): Payer: No Typology Code available for payment source

## 2023-09-02 DIAGNOSIS — I442 Atrioventricular block, complete: Secondary | ICD-10-CM

## 2023-09-02 LAB — CUP PACEART REMOTE DEVICE CHECK
Battery Remaining Longevity: 39 mo
Battery Voltage: 2.94 V
Brady Statistic AP VP Percent: 1.97 %
Brady Statistic AP VS Percent: 0.01 %
Brady Statistic AS VP Percent: 98.02 %
Brady Statistic AS VS Percent: 0 %
Brady Statistic RA Percent Paced: 1.98 %
Brady Statistic RV Percent Paced: 99.99 %
Date Time Interrogation Session: 20250425103234
Implantable Lead Connection Status: 753985
Implantable Lead Connection Status: 753985
Implantable Lead Connection Status: 753985
Implantable Lead Implant Date: 20210222
Implantable Lead Implant Date: 20210222
Implantable Lead Implant Date: 20211011
Implantable Lead Location: 753858
Implantable Lead Location: 753859
Implantable Lead Location: 753860
Implantable Lead Model: 5076
Implantable Lead Model: 5076
Implantable Pulse Generator Implant Date: 20211011
Lead Channel Impedance Value: 1007 Ohm
Lead Channel Impedance Value: 1026 Ohm
Lead Channel Impedance Value: 1102 Ohm
Lead Channel Impedance Value: 1140 Ohm
Lead Channel Impedance Value: 1292 Ohm
Lead Channel Impedance Value: 323 Ohm
Lead Channel Impedance Value: 342 Ohm
Lead Channel Impedance Value: 399 Ohm
Lead Channel Impedance Value: 437 Ohm
Lead Channel Impedance Value: 494 Ohm
Lead Channel Impedance Value: 532 Ohm
Lead Channel Impedance Value: 665 Ohm
Lead Channel Impedance Value: 722 Ohm
Lead Channel Impedance Value: 912 Ohm
Lead Channel Pacing Threshold Amplitude: 0.5 V
Lead Channel Pacing Threshold Amplitude: 0.875 V
Lead Channel Pacing Threshold Amplitude: 2.125 V
Lead Channel Pacing Threshold Pulse Width: 0.4 ms
Lead Channel Pacing Threshold Pulse Width: 0.4 ms
Lead Channel Pacing Threshold Pulse Width: 0.8 ms
Lead Channel Sensing Intrinsic Amplitude: 0.75 mV
Lead Channel Sensing Intrinsic Amplitude: 0.75 mV
Lead Channel Sensing Intrinsic Amplitude: 3.375 mV
Lead Channel Sensing Intrinsic Amplitude: 3.375 mV
Lead Channel Setting Pacing Amplitude: 1.5 V
Lead Channel Setting Pacing Amplitude: 2 V
Lead Channel Setting Pacing Amplitude: 3.25 V
Lead Channel Setting Pacing Pulse Width: 0.4 ms
Lead Channel Setting Pacing Pulse Width: 0.8 ms
Lead Channel Setting Sensing Sensitivity: 0.9 mV
Zone Setting Status: 755011

## 2023-10-10 NOTE — Progress Notes (Signed)
 Remote pacemaker transmission.

## 2023-12-02 ENCOUNTER — Ambulatory Visit: Payer: No Typology Code available for payment source

## 2023-12-02 DIAGNOSIS — I442 Atrioventricular block, complete: Secondary | ICD-10-CM | POA: Diagnosis not present

## 2023-12-02 LAB — CUP PACEART REMOTE DEVICE CHECK
Battery Remaining Longevity: 38 mo
Battery Voltage: 2.94 V
Brady Statistic AP VP Percent: 2.66 %
Brady Statistic AP VS Percent: 0.01 %
Brady Statistic AS VP Percent: 97.31 %
Brady Statistic AS VS Percent: 0.02 %
Brady Statistic RA Percent Paced: 2.67 %
Brady Statistic RV Percent Paced: 99.97 %
Date Time Interrogation Session: 20250725061856
Implantable Lead Connection Status: 753985
Implantable Lead Connection Status: 753985
Implantable Lead Connection Status: 753985
Implantable Lead Implant Date: 20210222
Implantable Lead Implant Date: 20210222
Implantable Lead Implant Date: 20211011
Implantable Lead Location: 753858
Implantable Lead Location: 753859
Implantable Lead Location: 753860
Implantable Lead Model: 5076
Implantable Lead Model: 5076
Implantable Pulse Generator Implant Date: 20211011
Lead Channel Impedance Value: 1026 Ohm
Lead Channel Impedance Value: 1102 Ohm
Lead Channel Impedance Value: 1121 Ohm
Lead Channel Impedance Value: 1292 Ohm
Lead Channel Impedance Value: 304 Ohm
Lead Channel Impedance Value: 304 Ohm
Lead Channel Impedance Value: 380 Ohm
Lead Channel Impedance Value: 380 Ohm
Lead Channel Impedance Value: 494 Ohm
Lead Channel Impedance Value: 494 Ohm
Lead Channel Impedance Value: 665 Ohm
Lead Channel Impedance Value: 741 Ohm
Lead Channel Impedance Value: 855 Ohm
Lead Channel Impedance Value: 988 Ohm
Lead Channel Pacing Threshold Amplitude: 0.625 V
Lead Channel Pacing Threshold Amplitude: 0.875 V
Lead Channel Pacing Threshold Amplitude: 1.875 V
Lead Channel Pacing Threshold Pulse Width: 0.4 ms
Lead Channel Pacing Threshold Pulse Width: 0.4 ms
Lead Channel Pacing Threshold Pulse Width: 0.8 ms
Lead Channel Sensing Intrinsic Amplitude: 1.75 mV
Lead Channel Sensing Intrinsic Amplitude: 1.75 mV
Lead Channel Sensing Intrinsic Amplitude: 3.375 mV
Lead Channel Sensing Intrinsic Amplitude: 3.375 mV
Lead Channel Setting Pacing Amplitude: 1.5 V
Lead Channel Setting Pacing Amplitude: 2 V
Lead Channel Setting Pacing Amplitude: 3.25 V
Lead Channel Setting Pacing Pulse Width: 0.4 ms
Lead Channel Setting Pacing Pulse Width: 0.8 ms
Lead Channel Setting Sensing Sensitivity: 0.9 mV
Zone Setting Status: 755011

## 2023-12-12 ENCOUNTER — Ambulatory Visit: Payer: Self-pay | Admitting: Cardiology

## 2024-02-09 NOTE — Progress Notes (Signed)
 Remote PPM Transmission

## 2024-03-02 ENCOUNTER — Ambulatory Visit: Payer: No Typology Code available for payment source

## 2024-03-02 DIAGNOSIS — I442 Atrioventricular block, complete: Secondary | ICD-10-CM | POA: Diagnosis not present

## 2024-03-02 LAB — CUP PACEART REMOTE DEVICE CHECK
Battery Remaining Longevity: 27 mo
Battery Voltage: 2.92 V
Brady Statistic AP VP Percent: 4.47 %
Brady Statistic AP VS Percent: 0.01 %
Brady Statistic AS VP Percent: 95.49 %
Brady Statistic AS VS Percent: 0.03 %
Brady Statistic RA Percent Paced: 4.48 %
Brady Statistic RV Percent Paced: 99.96 %
Date Time Interrogation Session: 20251023201123
Implantable Lead Connection Status: 753985
Implantable Lead Connection Status: 753985
Implantable Lead Connection Status: 753985
Implantable Lead Implant Date: 20210222
Implantable Lead Implant Date: 20210222
Implantable Lead Implant Date: 20211011
Implantable Lead Location: 753858
Implantable Lead Location: 753859
Implantable Lead Location: 753860
Implantable Lead Model: 5076
Implantable Lead Model: 5076
Implantable Pulse Generator Implant Date: 20211011
Lead Channel Impedance Value: 1121 Ohm
Lead Channel Impedance Value: 1121 Ohm
Lead Channel Impedance Value: 1292 Ohm
Lead Channel Impedance Value: 323 Ohm
Lead Channel Impedance Value: 323 Ohm
Lead Channel Impedance Value: 399 Ohm
Lead Channel Impedance Value: 418 Ohm
Lead Channel Impedance Value: 494 Ohm
Lead Channel Impedance Value: 494 Ohm
Lead Channel Impedance Value: 646 Ohm
Lead Channel Impedance Value: 741 Ohm
Lead Channel Impedance Value: 874 Ohm
Lead Channel Impedance Value: 969 Ohm
Lead Channel Impedance Value: 988 Ohm
Lead Channel Pacing Threshold Amplitude: 0.625 V
Lead Channel Pacing Threshold Amplitude: 1.125 V
Lead Channel Pacing Threshold Amplitude: 2.375 V
Lead Channel Pacing Threshold Pulse Width: 0.4 ms
Lead Channel Pacing Threshold Pulse Width: 0.4 ms
Lead Channel Pacing Threshold Pulse Width: 0.8 ms
Lead Channel Sensing Intrinsic Amplitude: 1 mV
Lead Channel Sensing Intrinsic Amplitude: 1 mV
Lead Channel Sensing Intrinsic Amplitude: 3.375 mV
Lead Channel Sensing Intrinsic Amplitude: 3.375 mV
Lead Channel Setting Pacing Amplitude: 1.5 V
Lead Channel Setting Pacing Amplitude: 2.25 V
Lead Channel Setting Pacing Amplitude: 3.5 V
Lead Channel Setting Pacing Pulse Width: 0.4 ms
Lead Channel Setting Pacing Pulse Width: 0.8 ms
Lead Channel Setting Sensing Sensitivity: 0.9 mV
Zone Setting Status: 755011

## 2024-03-05 ENCOUNTER — Ambulatory Visit: Payer: Self-pay | Admitting: Cardiology

## 2024-03-06 NOTE — Progress Notes (Signed)
 Remote PPM Transmission

## 2024-06-01 ENCOUNTER — Ambulatory Visit

## 2024-06-06 ENCOUNTER — Telehealth: Payer: Self-pay | Admitting: Cardiology

## 2024-06-06 NOTE — Telephone Encounter (Signed)
 Called for missed remote.

## 2024-08-31 ENCOUNTER — Ambulatory Visit

## 2024-11-30 ENCOUNTER — Ambulatory Visit

## 2025-03-01 ENCOUNTER — Ambulatory Visit

## 2025-05-31 ENCOUNTER — Ambulatory Visit
